# Patient Record
Sex: Female | Born: 1960
Health system: Southern US, Community
[De-identification: ages and names within clinical notes are randomized; demographics above are authoritative.]

## PROBLEM LIST (undated history)

## (undated) DIAGNOSIS — R079 Chest pain, unspecified: Secondary | ICD-10-CM

## (undated) DIAGNOSIS — E538 Deficiency of other specified B group vitamins: Secondary | ICD-10-CM

## (undated) DIAGNOSIS — R011 Cardiac murmur, unspecified: Secondary | ICD-10-CM

## (undated) DIAGNOSIS — R519 Headache, unspecified: Secondary | ICD-10-CM

## (undated) DIAGNOSIS — Z860100 Personal history of colon polyps, unspecified: Secondary | ICD-10-CM

## (undated) DIAGNOSIS — M5030 Other cervical disc degeneration, unspecified cervical region: Secondary | ICD-10-CM

## (undated) DIAGNOSIS — L738 Other specified follicular disorders: Secondary | ICD-10-CM

## (undated) DIAGNOSIS — K635 Polyp of colon: Secondary | ICD-10-CM

## (undated) DIAGNOSIS — Z720 Tobacco use: Secondary | ICD-10-CM

## (undated) DIAGNOSIS — F329 Major depressive disorder, single episode, unspecified: Secondary | ICD-10-CM

## (undated) DIAGNOSIS — Z9889 Other specified postprocedural states: Secondary | ICD-10-CM

## (undated) DIAGNOSIS — K219 Gastro-esophageal reflux disease without esophagitis: Secondary | ICD-10-CM

## (undated) DIAGNOSIS — L678 Other hair color and hair shaft abnormalities: Secondary | ICD-10-CM

## (undated) DIAGNOSIS — M7730 Calcaneal spur, unspecified foot: Secondary | ICD-10-CM

## (undated) DIAGNOSIS — R109 Unspecified abdominal pain: Secondary | ICD-10-CM

## (undated) DIAGNOSIS — I10 Essential (primary) hypertension: Secondary | ICD-10-CM

## (undated) DIAGNOSIS — R21 Rash and other nonspecific skin eruption: Secondary | ICD-10-CM

## (undated) DIAGNOSIS — N6019 Diffuse cystic mastopathy of unspecified breast: Secondary | ICD-10-CM

## (undated) DIAGNOSIS — M1990 Unspecified osteoarthritis, unspecified site: Secondary | ICD-10-CM

## (undated) DIAGNOSIS — R51 Headache: Secondary | ICD-10-CM

## (undated) DIAGNOSIS — G8929 Other chronic pain: Secondary | ICD-10-CM

## (undated) DIAGNOSIS — E669 Obesity, unspecified: Secondary | ICD-10-CM

## (undated) DIAGNOSIS — E559 Vitamin D deficiency, unspecified: Secondary | ICD-10-CM

## (undated) DIAGNOSIS — J189 Pneumonia, unspecified organism: Secondary | ICD-10-CM

## (undated) DIAGNOSIS — M5136 Other intervertebral disc degeneration, lumbar region: Secondary | ICD-10-CM

## (undated) DIAGNOSIS — E119 Type 2 diabetes mellitus without complications: Secondary | ICD-10-CM

## (undated) DIAGNOSIS — E785 Hyperlipidemia, unspecified: Secondary | ICD-10-CM

## (undated) DIAGNOSIS — R002 Palpitations: Secondary | ICD-10-CM

## (undated) DIAGNOSIS — Z87898 Personal history of other specified conditions: Secondary | ICD-10-CM

## (undated) DIAGNOSIS — M797 Fibromyalgia: Secondary | ICD-10-CM

## (undated) DIAGNOSIS — R000 Tachycardia, unspecified: Secondary | ICD-10-CM

## (undated) DIAGNOSIS — R112 Nausea with vomiting, unspecified: Secondary | ICD-10-CM

## (undated) DIAGNOSIS — M549 Dorsalgia, unspecified: Secondary | ICD-10-CM

## (undated) DIAGNOSIS — G4733 Obstructive sleep apnea (adult) (pediatric): Secondary | ICD-10-CM

## (undated) DIAGNOSIS — F32A Depression, unspecified: Secondary | ICD-10-CM

## (undated) DIAGNOSIS — G43909 Migraine, unspecified, not intractable, without status migrainosus: Secondary | ICD-10-CM

## (undated) HISTORY — DX: Tobacco use: Z72.0

## (undated) HISTORY — PX: BACK SURGERY: SHX140

## (undated) HISTORY — DX: Type 2 diabetes mellitus without complications: E11.9

## (undated) HISTORY — DX: Calcaneal spur, unspecified foot: M77.30

## (undated) HISTORY — PX: TONSILLECTOMY: SUR1361

## (undated) HISTORY — DX: Fibromyalgia: M79.7

## (undated) HISTORY — DX: Essential (primary) hypertension: I10

## (undated) HISTORY — DX: Diffuse cystic mastopathy of unspecified breast: N60.19

## (undated) HISTORY — DX: Obesity, unspecified: E66.9

## (undated) HISTORY — DX: Hyperlipidemia, unspecified: E78.5

## (undated) HISTORY — PX: ECTOPIC PREGNANCY SURGERY: SHX613

## (undated) HISTORY — DX: Personal history of colonic polyps: Z86.010

## (undated) HISTORY — DX: Vitamin D deficiency, unspecified: E55.9

## (undated) HISTORY — DX: Major depressive disorder, single episode, unspecified: F32.9

## (undated) HISTORY — DX: Other specified follicular disorders: L73.8

## (undated) HISTORY — PX: TUBAL LIGATION: SHX77

## (undated) HISTORY — DX: Personal history of other specified conditions: Z87.898

## (undated) HISTORY — DX: Deficiency of other specified B group vitamins: E53.8

## (undated) HISTORY — DX: Unspecified abdominal pain: R10.9

## (undated) HISTORY — DX: Obstructive sleep apnea (adult) (pediatric): G47.33

## (undated) HISTORY — DX: Personal history of colon polyps, unspecified: Z86.0100

## (undated) HISTORY — DX: Headache, unspecified: R51.9

## (undated) HISTORY — PX: NASAL SINUS SURGERY: SHX719

## (undated) HISTORY — DX: Polyp of colon: K63.5

## (undated) HISTORY — DX: Tachycardia, unspecified: R00.0

## (undated) HISTORY — DX: Other intervertebral disc degeneration, lumbar region: M51.36

## (undated) HISTORY — DX: Other hair color and hair shaft abnormalities: L67.8

## (undated) HISTORY — DX: Gastro-esophageal reflux disease without esophagitis: K21.9

## (undated) HISTORY — DX: Chest pain, unspecified: R07.9

## (undated) HISTORY — DX: Other cervical disc degeneration, unspecified cervical region: M50.30

## (undated) HISTORY — DX: Cardiac murmur, unspecified: R01.1

## (undated) HISTORY — DX: Rash and other nonspecific skin eruption: R21

## (undated) HISTORY — DX: Headache: R51

## (undated) HISTORY — DX: Depression, unspecified: F32.A

---

## 1998-06-25 HISTORY — PX: CARDIAC CATHETERIZATION: SHX172

## 2000-09-05 ENCOUNTER — Encounter: Admission: RE | Admit: 2000-09-05 | Discharge: 2000-09-05 | Payer: Self-pay | Admitting: Family Medicine

## 2000-09-05 ENCOUNTER — Encounter: Payer: Self-pay | Admitting: Family Medicine

## 2000-09-13 ENCOUNTER — Other Ambulatory Visit: Admission: RE | Admit: 2000-09-13 | Discharge: 2000-09-13 | Payer: Self-pay | Admitting: Family Medicine

## 2000-09-17 ENCOUNTER — Encounter: Admission: RE | Admit: 2000-09-17 | Discharge: 2000-09-17 | Payer: Self-pay | Admitting: Family Medicine

## 2000-09-17 ENCOUNTER — Encounter: Payer: Self-pay | Admitting: Family Medicine

## 2000-10-17 LAB — FECAL OCCULT BLOOD, GUAIAC: Fecal Occult Blood: NEGATIVE

## 2002-02-11 ENCOUNTER — Encounter: Payer: Self-pay | Admitting: Family Medicine

## 2002-02-11 ENCOUNTER — Other Ambulatory Visit: Admission: RE | Admit: 2002-02-11 | Discharge: 2002-02-11 | Payer: Self-pay | Admitting: Family Medicine

## 2002-02-11 LAB — CONVERTED CEMR LAB: Pap Smear: NORMAL

## 2003-06-26 HISTORY — PX: ULNAR NERVE TRANSPOSITION: SHX2595

## 2003-11-05 ENCOUNTER — Encounter: Admission: RE | Admit: 2003-11-05 | Discharge: 2003-11-05 | Payer: Self-pay | Admitting: Specialist

## 2003-11-24 HISTORY — PX: ANTERIOR CERVICAL DECOMP/DISCECTOMY FUSION: SHX1161

## 2003-12-21 ENCOUNTER — Observation Stay (HOSPITAL_COMMUNITY): Admission: RE | Admit: 2003-12-21 | Discharge: 2003-12-22 | Payer: Self-pay | Admitting: Specialist

## 2004-05-12 ENCOUNTER — Ambulatory Visit: Payer: Self-pay | Admitting: Family Medicine

## 2004-06-25 HISTORY — PX: COLONOSCOPY: SHX174

## 2004-06-25 HISTORY — PX: SHOULDER ARTHROSCOPY WITH ROTATOR CUFF REPAIR: SHX5685

## 2004-07-04 ENCOUNTER — Ambulatory Visit: Payer: Self-pay | Admitting: Professional

## 2004-09-07 ENCOUNTER — Ambulatory Visit: Payer: Self-pay | Admitting: Family Medicine

## 2004-09-18 ENCOUNTER — Ambulatory Visit: Payer: Self-pay | Admitting: Family Medicine

## 2004-10-24 ENCOUNTER — Ambulatory Visit: Payer: Self-pay | Admitting: Family Medicine

## 2005-02-28 ENCOUNTER — Ambulatory Visit: Payer: Self-pay | Admitting: Family Medicine

## 2005-03-19 ENCOUNTER — Ambulatory Visit: Payer: Self-pay | Admitting: Family Medicine

## 2005-03-23 ENCOUNTER — Ambulatory Visit: Payer: Self-pay | Admitting: Internal Medicine

## 2005-04-04 ENCOUNTER — Ambulatory Visit: Payer: Self-pay | Admitting: Internal Medicine

## 2005-04-04 ENCOUNTER — Encounter (INDEPENDENT_AMBULATORY_CARE_PROVIDER_SITE_OTHER): Payer: Self-pay | Admitting: *Deleted

## 2005-06-01 ENCOUNTER — Encounter: Admission: RE | Admit: 2005-06-01 | Discharge: 2005-06-01 | Payer: Self-pay | Admitting: Orthopedic Surgery

## 2005-12-14 ENCOUNTER — Ambulatory Visit: Payer: Self-pay | Admitting: Cardiovascular Disease

## 2005-12-17 ENCOUNTER — Ambulatory Visit: Payer: Self-pay

## 2005-12-17 ENCOUNTER — Encounter: Payer: Self-pay | Admitting: Cardiology

## 2006-02-04 ENCOUNTER — Ambulatory Visit: Payer: Self-pay | Admitting: Family Medicine

## 2006-04-30 ENCOUNTER — Ambulatory Visit: Payer: Self-pay | Admitting: Family Medicine

## 2006-05-02 ENCOUNTER — Encounter: Admission: RE | Admit: 2006-05-02 | Discharge: 2006-05-02 | Payer: Self-pay | Admitting: Family Medicine

## 2006-05-02 LAB — HM MAMMOGRAPHY: HM Mammogram: NORMAL

## 2006-05-08 ENCOUNTER — Ambulatory Visit: Payer: Self-pay | Admitting: Family Medicine

## 2006-05-08 LAB — LIPID PANEL
Cholesterol: 283 mg/dL — AB (ref 0–200)
HDL: 32 mg/dL — AB (ref 35–70)
Triglycerides: 284

## 2006-05-08 LAB — COMPREHENSIVE METABOLIC PANEL
ALT: 19 U/L (ref 7–35)
AST: 18 U/L
BUN: 11 mg/dL (ref 4–21)
Calcium: 8.7 mg/dL
Creat: 0.9
Glucose: 89
Potassium: 3.6 mmol/L
Sodium: 141 mmol/L (ref 137–147)

## 2006-05-08 LAB — CBC: Hemoglobin: 13.8 g/dL (ref 12.0–16.0)

## 2006-05-22 ENCOUNTER — Ambulatory Visit: Payer: Self-pay | Admitting: Family Medicine

## 2006-12-14 DIAGNOSIS — R109 Unspecified abdominal pain: Secondary | ICD-10-CM | POA: Insufficient documentation

## 2006-12-16 ENCOUNTER — Encounter: Payer: Self-pay | Admitting: Family Medicine

## 2006-12-16 ENCOUNTER — Ambulatory Visit: Payer: Self-pay | Admitting: Internal Medicine

## 2006-12-16 DIAGNOSIS — K219 Gastro-esophageal reflux disease without esophagitis: Secondary | ICD-10-CM | POA: Insufficient documentation

## 2006-12-16 DIAGNOSIS — L738 Other specified follicular disorders: Secondary | ICD-10-CM | POA: Insufficient documentation

## 2006-12-16 DIAGNOSIS — R011 Cardiac murmur, unspecified: Secondary | ICD-10-CM | POA: Insufficient documentation

## 2006-12-16 DIAGNOSIS — N6019 Diffuse cystic mastopathy of unspecified breast: Secondary | ICD-10-CM | POA: Insufficient documentation

## 2006-12-16 DIAGNOSIS — F331 Major depressive disorder, recurrent, moderate: Secondary | ICD-10-CM | POA: Insufficient documentation

## 2006-12-17 ENCOUNTER — Encounter: Payer: Self-pay | Admitting: Internal Medicine

## 2006-12-25 ENCOUNTER — Telehealth: Payer: Self-pay | Admitting: Family Medicine

## 2006-12-31 ENCOUNTER — Ambulatory Visit: Payer: Self-pay | Admitting: Family Medicine

## 2006-12-31 DIAGNOSIS — R5383 Other fatigue: Secondary | ICD-10-CM

## 2006-12-31 DIAGNOSIS — R5381 Other malaise: Secondary | ICD-10-CM | POA: Insufficient documentation

## 2007-01-02 LAB — CONVERTED CEMR LAB
ALT: 30 units/L (ref 0–35)
AST: 33 units/L (ref 0–37)
Albumin: 3.5 g/dL (ref 3.5–5.2)
Alkaline Phosphatase: 78 units/L (ref 39–117)
BUN: 8 mg/dL (ref 6–23)
Basophils Absolute: 0 10*3/uL (ref 0.0–0.1)
Basophils Relative: 0.1 % (ref 0.0–1.0)
Bilirubin, Direct: 0.1 mg/dL (ref 0.0–0.3)
CO2: 29 meq/L (ref 19–32)
Calcium: 8.9 mg/dL (ref 8.4–10.5)
Chloride: 105 meq/L (ref 96–112)
Cholesterol: 281 mg/dL (ref 0–200)
Creatinine, Ser: 0.8 mg/dL (ref 0.4–1.2)
Direct LDL: 221.8 mg/dL
Eosinophils Absolute: 0.4 10*3/uL (ref 0.0–0.6)
Eosinophils Relative: 4.2 % (ref 0.0–5.0)
GFR calc Af Amer: 101 mL/min
GFR calc non Af Amer: 83 mL/min
Glucose, Bld: 173 mg/dL — ABNORMAL HIGH (ref 70–99)
HCT: 37.9 % (ref 36.0–46.0)
HDL: 31.3 mg/dL — ABNORMAL LOW (ref >39.0)
Hemoglobin: 13.2 g/dL (ref 12.0–15.0)
Lymphocytes Relative: 28.6 % (ref 12.0–46.0)
MCHC: 34.9 g/dL (ref 30.0–36.0)
MCV: 89.3 fL (ref 78.0–100.0)
Monocytes Absolute: 0.5 10*3/uL (ref 0.2–0.7)
Monocytes Relative: 5 % (ref 3.0–11.0)
Neutro Abs: 5.7 10*3/uL (ref 1.4–7.7)
Neutrophils Relative %: 62.1 % (ref 43.0–77.0)
Phosphorus: 3.2 mg/dL (ref 2.3–4.6)
Platelets: 433 10*3/uL — ABNORMAL HIGH (ref 150–400)
Potassium: 3.9 meq/L (ref 3.5–5.1)
RBC: 4.24 M/uL (ref 3.87–5.11)
RDW: 14.9 % — ABNORMAL HIGH (ref 11.5–14.6)
Sodium: 141 meq/L (ref 135–145)
TSH: 1.57 microintl units/mL (ref 0.35–5.50)
Total Bilirubin: 0.9 mg/dL (ref 0.3–1.2)
Total CHOL/HDL Ratio: 9
Total Protein: 7.3 g/dL (ref 6.0–8.3)
Triglycerides: 169 mg/dL — ABNORMAL HIGH (ref 0–149)
VLDL: 34 mg/dL (ref 0–40)
WBC: 9.2 10*3/uL (ref 4.5–10.5)

## 2007-01-23 ENCOUNTER — Ambulatory Visit: Payer: Self-pay | Admitting: Family Medicine

## 2007-01-23 DIAGNOSIS — F17200 Nicotine dependence, unspecified, uncomplicated: Secondary | ICD-10-CM | POA: Insufficient documentation

## 2007-01-23 DIAGNOSIS — E1165 Type 2 diabetes mellitus with hyperglycemia: Secondary | ICD-10-CM

## 2007-01-23 DIAGNOSIS — E118 Type 2 diabetes mellitus with unspecified complications: Secondary | ICD-10-CM | POA: Insufficient documentation

## 2007-01-23 DIAGNOSIS — Z87891 Personal history of nicotine dependence: Secondary | ICD-10-CM | POA: Insufficient documentation

## 2007-01-28 ENCOUNTER — Ambulatory Visit: Payer: Self-pay | Admitting: Family Medicine

## 2007-01-29 LAB — CONVERTED CEMR LAB: Hgb A1c MFr Bld: 6.2 % — ABNORMAL HIGH (ref 4.6–6.0)

## 2007-02-27 ENCOUNTER — Telehealth (INDEPENDENT_AMBULATORY_CARE_PROVIDER_SITE_OTHER): Payer: Self-pay | Admitting: *Deleted

## 2007-12-11 ENCOUNTER — Ambulatory Visit: Payer: Self-pay | Admitting: Family Medicine

## 2008-04-27 ENCOUNTER — Ambulatory Visit: Payer: Self-pay | Admitting: Family Medicine

## 2008-04-27 DIAGNOSIS — Z860100 Personal history of colon polyps, unspecified: Secondary | ICD-10-CM | POA: Insufficient documentation

## 2008-04-29 LAB — CONVERTED CEMR LAB
ALT: 19 units/L (ref 0–35)
AST: 23 units/L (ref 0–37)
Albumin: 3.6 g/dL (ref 3.5–5.2)
Alkaline Phosphatase: 70 units/L (ref 39–117)
BUN: 8 mg/dL (ref 6–23)
Basophils Absolute: 0.1 10*3/uL (ref 0.0–0.1)
Basophils Relative: 0.9 % (ref 0.0–3.0)
Bilirubin, Direct: 0.1 mg/dL (ref 0.0–0.3)
CO2: 25 meq/L (ref 19–32)
Calcium: 8.6 mg/dL (ref 8.4–10.5)
Chloride: 105 meq/L (ref 96–112)
Cholesterol: 275 mg/dL (ref 0–200)
Creatinine, Ser: 0.8 mg/dL (ref 0.4–1.2)
Direct LDL: 194 mg/dL
Eosinophils Absolute: 0.3 10*3/uL (ref 0.0–0.7)
Eosinophils Relative: 4.2 % (ref 0.0–5.0)
GFR calc Af Amer: 100 mL/min
GFR calc non Af Amer: 82 mL/min
Glucose, Bld: 91 mg/dL (ref 70–99)
HCT: 38.1 % (ref 36.0–46.0)
HDL: 32 mg/dL — ABNORMAL LOW (ref >39.0)
Hemoglobin: 13.1 g/dL (ref 12.0–15.0)
Hgb A1c MFr Bld: 6.2 % — ABNORMAL HIGH (ref 4.6–6.0)
Lymphocytes Relative: 26.7 % (ref 12.0–46.0)
MCHC: 34.5 g/dL (ref 30.0–36.0)
MCV: 88.3 fL (ref 78.0–100.0)
Monocytes Absolute: 0.6 10*3/uL (ref 0.1–1.0)
Monocytes Relative: 8.7 % (ref 3.0–12.0)
Neutro Abs: 4.1 10*3/uL (ref 1.4–7.7)
Neutrophils Relative %: 59.5 % (ref 43.0–77.0)
Phosphorus: 2.8 mg/dL (ref 2.3–4.6)
Platelets: 285 10*3/uL (ref 150–400)
Potassium: 3.6 meq/L (ref 3.5–5.1)
RBC: 4.31 M/uL (ref 3.87–5.11)
RDW: 14.1 % (ref 11.5–14.6)
Sodium: 138 meq/L (ref 135–145)
TSH: 1.49 microintl units/mL (ref 0.35–5.50)
Total Bilirubin: 0.7 mg/dL (ref 0.3–1.2)
Total CHOL/HDL Ratio: 8.6
Total Protein: 7.6 g/dL (ref 6.0–8.3)
Triglycerides: 196 mg/dL — ABNORMAL HIGH (ref 0–149)
VLDL: 39 mg/dL (ref 0–40)
WBC: 6.9 10*3/uL (ref 4.5–10.5)

## 2008-06-23 ENCOUNTER — Telehealth: Payer: Self-pay | Admitting: Family Medicine

## 2009-01-11 ENCOUNTER — Ambulatory Visit: Payer: Self-pay | Admitting: Family Medicine

## 2009-03-28 ENCOUNTER — Encounter: Payer: Self-pay | Admitting: Family Medicine

## 2009-05-25 ENCOUNTER — Telehealth: Payer: Self-pay | Admitting: Family Medicine

## 2009-06-25 HISTORY — PX: DOBUTAMINE STRESS ECHO: SHX5426

## 2009-08-17 ENCOUNTER — Ambulatory Visit: Payer: Self-pay | Admitting: Family Medicine

## 2009-08-19 ENCOUNTER — Telehealth: Payer: Self-pay | Admitting: Family Medicine

## 2009-09-01 ENCOUNTER — Telehealth: Payer: Self-pay | Admitting: Family Medicine

## 2010-02-14 ENCOUNTER — Ambulatory Visit: Payer: Self-pay | Admitting: Family Medicine

## 2010-04-05 ENCOUNTER — Encounter (INDEPENDENT_AMBULATORY_CARE_PROVIDER_SITE_OTHER): Payer: Self-pay | Admitting: *Deleted

## 2010-05-16 ENCOUNTER — Observation Stay (HOSPITAL_COMMUNITY)
Admission: EM | Admit: 2010-05-16 | Discharge: 2010-05-17 | Payer: Self-pay | Source: Home / Self Care | Admitting: Emergency Medicine

## 2010-05-17 ENCOUNTER — Encounter: Payer: Self-pay | Admitting: Family Medicine

## 2010-05-17 ENCOUNTER — Ambulatory Visit: Payer: Self-pay | Admitting: Cardiology

## 2010-05-17 ENCOUNTER — Encounter (INDEPENDENT_AMBULATORY_CARE_PROVIDER_SITE_OTHER): Payer: Self-pay | Admitting: Emergency Medicine

## 2010-06-06 ENCOUNTER — Ambulatory Visit: Payer: Self-pay | Admitting: Family Medicine

## 2010-06-06 DIAGNOSIS — R079 Chest pain, unspecified: Secondary | ICD-10-CM | POA: Insufficient documentation

## 2010-06-09 LAB — CONVERTED CEMR LAB
ALT: 17 units/L (ref 0–35)
AST: 21 units/L (ref 0–37)
Cholesterol: 297 mg/dL — ABNORMAL HIGH (ref 0–200)
Direct LDL: 225.7 mg/dL
HDL: 40.9 mg/dL (ref >39.00)
Hgb A1c MFr Bld: 6.4 % (ref 4.6–6.5)
Total CHOL/HDL Ratio: 7
Triglycerides: 174 mg/dL — ABNORMAL HIGH (ref 0.0–149.0)
VLDL: 34.8 mg/dL (ref 0.0–40.0)

## 2010-06-15 ENCOUNTER — Ambulatory Visit: Payer: Self-pay | Admitting: Cardiovascular Disease

## 2010-06-20 ENCOUNTER — Encounter: Payer: Self-pay | Admitting: Cardiovascular Disease

## 2010-06-20 DIAGNOSIS — R0600 Dyspnea, unspecified: Secondary | ICD-10-CM

## 2010-06-20 DIAGNOSIS — I10 Essential (primary) hypertension: Secondary | ICD-10-CM | POA: Insufficient documentation

## 2010-06-20 DIAGNOSIS — R000 Tachycardia, unspecified: Secondary | ICD-10-CM | POA: Insufficient documentation

## 2010-06-20 DIAGNOSIS — E785 Hyperlipidemia, unspecified: Secondary | ICD-10-CM | POA: Insufficient documentation

## 2010-07-25 NOTE — Letter (Signed)
Summary: Colonoscopy Letter  [facility]  [address] [place], [place] [address]   Phone: [phone]  Fax: [phone]      April 05, 2010 MRN: [id number]   ROLANDO WHITBY [address] [place], [place]  [address]   Dear Ms. Shean,   According to your medical record, it is time for you to schedule a Colonoscopy. The American Cancer Society recommends this procedure as a method to detect early colon cancer. Patients with a family history of colon cancer, or a personal history of colon polyps or inflammatory bowel disease are at increased risk.  This letter has been generated based on the recommendations made at the time of your procedure. If you feel that in your particular situation this may no longer apply, please contact our office.  Please call our office at [phone] to schedule this appointment or to update your records at your earliest convenience.  Thank you for cooperating with [place] to provide you with the very best care possible.   Sincerely,   Iva Boop, M.D.  [facility] [phone]

## 2010-07-25 NOTE — Assessment & Plan Note (Signed)
Summary: OV   Vital Signs:  Patient Profile:   50 Years Old Female Weight:      286.04 pounds Temp:     98.2 degrees F oral Pulse rate:   80 / minute Pulse rhythm:   regular BP sitting:   126 / 100  (right arm) Cuff size:   large  Vitals Entered By: L. Etheleen Mayhew, CMA               PCP:  Publishing rights manager Complaint:  Medication changes.  History of Present Illness: has not had bp med this am feels bad, tired, anxious, and mind is racing periods are lasting for over 10 days (followed by gyn)- had endo bx which was ok  was on cymbalta from headache Dr Vela Prose), had been on effexor (neither worked for her HA) now getting back on prozac just several days -worked well for her mood in past but has just gone back on it  is really tired at this point (? also peri menup hot flashes and night sweats) is up all night- most nights tylenol pm will help her sleep for 3 hours only is not on any hormones right now- but considering progesterone if menses don't improve (with gyn) lots of stress- has had sessions in Dr Luiz Blare in the past has good support at home (just gets overwhelmed)  uses imitrex for headaches, and has dilaudid for severe emergency breakthrough ha (seldom takes it)  needs chol check on vytorin as well has not had her [place] of abd yet, still a lot of nausea  Current Allergies: ! PENICILLIN V POTASSIUM ! CODEINE SULFATE ! HYDROCODONE-GUAIFENESIN     Review of Systems  General      Complains of fatigue.      Denies chills, fever, and loss of appetite.  ENT      Denies sinus pressure.  CV      Complains of palpitations.      Denies chest pain or discomfort.      when she is nervous  Resp      Denies shortness of breath.  GI      Complains of nausea.      Denies diarrhea and vomiting.  MS      Complains of stiffness.  Derm      Denies rash.  Psych      Complains of anxiety, depression, and irritability.      Denies suicidal thoughts/plans.  Endo   Denies excessive thirst and excessive urination.   Physical Exam  General:     overwt and fatigued appearing Head:     Normocephalic and atraumatic without obvious abnormalities. No apparent alopecia or balding. Eyes:     vision grossly intact, pupils equal, pupils round, and pupils reactive to light.  no pallor or icterus Mouth:     MMM pharynx pink and moist.   Neck:     No deformities, masses, or tenderness noted.no thyromegaly, no JVD, and no carotid bruits.   Chest Wall:     No deformities, masses, or tenderness noted. Lungs:     Normal respiratory effort, chest expands symmetrically. Lungs are clear to auscultation, no crackles or wheezes. Heart:     Normal rate and regular rhythm. S1 and S2 normal without gallop, murmur, click, rub or other extra sounds. Abdomen:     soft, non-tender, normal bowel sounds, no distention, no masses, no hepatomegaly, and no splenomegaly.   Msk:     no acute joint changes Pulses:  R and L carotid,radial,femoral,dorsalis pedis and posterior tibial pulses are full and equal bilaterally Extremities:     trace left pedal edema and trace right pedal edema.   Neurologic:     sensation intact to light touch, gait normal, and DTRs symmetrical and normal.  no tremor Skin:     turgor normal, color normal, and no rashes.  dermatitis on arms and legs looks improved with healing scabs Cervical Nodes:     No lymphadenopathy noted Psych:     anxious and depressed, no SI, good insight    Impression & Recommendations:  Problem # 1:  FATIGUE (ICD-780.79) with heavy menses and peri menup symptoms also depression and anx will check labs Orders: TLB-CBC Platelet - w/Differential (85025-CBCD) TLB-TSH (Thyroid Stimulating Hormone) (84443-TSH)   Problem # 2:  HYPERTENSION (ICD-401.9) did not take meds today will check K Her updated medication list for this problem includes:    Hydrochlorothiazide 12.5 Mg Tabs (Hydrochlorothiazide) .Marland Kitchen... Take  one by mouth daily  Orders: TLB-Renal Function Panel (80069-RENAL)   Problem # 3:  DEPRESSION (ICD-311) with sleep disorder (also may be hormonal) will try ambien to help sleep prn will give the prozac more time to work offered counseling again if she wants it Her updated medication list for this problem includes:    Prozac 20 Mg Caps (Fluoxetine hcl) .Marland Kitchen... 1 by mouth qd   Problem # 4:  NAUSEA (ICD-787.02) still pend abd [place] Her updated medication list for this problem includes:    Promethazine Hcl 25 Mg Tabs (Promethazine hcl) .Marland Kitchen... 1 tab every 6 hours as needed nausea. may cause drowsiness.   Medications Added to Medication List This Visit: 1)  Ambien 10 Mg Tabs (Zolpidem tartrate) .Marland Kitchen.. 1 by mouth at bedtime as needed insomnia  Other Orders: Venipuncture (13244) TLB-Lipid Panel (80061-LIPID) TLB-Hepatic/Liver Function Pnl (80076-HEPATIC)   Patient Instructions: 1)  we will check labs today 2)  continue prozac as px 3)  try ambien 10 mg at bedtime for sleep- use caution, it may be habit forming    Prescriptions: AMBIEN 10 MG  TABS (ZOLPIDEM TARTRATE) 1 by mouth at bedtime as needed insomnia  #30 x 0   Entered and Authorized by:   Judith Part MD   Signed by:   Judith Part MD on 12/31/2006   Method used:   Print then Give to Patient   RxID:   [id number]

## 2010-07-25 NOTE — Assessment & Plan Note (Signed)
Summary: STOMACH/CLE   Vital Signs:  Patient Profile:   50 Years Old Female Weight:      287 pounds Temp:     99.3 degrees F oral BP sitting:   132 / 90  (right arm) Cuff size:   large  Vitals Entered By: Lowella Petties (December 16, 2006 2:12 PM)               Visit Type:  Acute PCP:  [facility]  Chief Complaint:  Upper abd pain and nausea.  History of Present Illness: Has had some problem with mid-epigastric pain that is sometimes accompanied by nausea. She has actually had some vomiting. The color of trhe stools is pale tannish to a very dark green. Theis changes and isn't also true. The BM's are regular. No fevers or chills.  Current Allergies (reviewed today): ! PENICILLIN V POTASSIUM (PENICILLIN V POTASSIUM TABS) ! CODEINE SULFATE (CODEINE SULFATE TABS) ! HYDROCODONE-GUAIFENESIN (HYDROCODONE-GUAIFENESIN TABS) Updated/Current Medications (including changes made in today's visit):  HYDROCHLOROTHIAZIDE 12.5 MG  TABS (HYDROCHLOROTHIAZIDE) take one by mouth daily NEXIUM 40 MG  CPDR (ESOMEPRAZOLE MAGNESIUM) take one by mouth daily VYTORIN 10-80 MG  TABS (EZETIMIBE-SIMVASTATIN) take one by mouth daily CYMBALTA 60 MG  CPEP (DULOXETINE HCL) take one by mouth daily ZANAFLEX   CAPS (TIZANIDINE HCL CAPS) take 8 mg tid DILAUDID 4 MG  TABS (HYDROMORPHONE HCL) take 4-8 mg prn IMITREX 100 MG  TABS (SUMATRIPTAN SUCCINATE) take one by mouth prn K-TABS   TBCR (POTASSIUM CHLORIDE TBCR) 10 mEq 2 daily TYLENOL EXTRA STRENGTH 500 MG  TABS (ACETAMINOPHEN) 2 prn PROMETHAZINE HCL 25 MG  TABS (PROMETHAZINE HCL) 1 tab every 6 hours as needed nausea. May cause drowsiness.      Review of Systems       The patient complains of abdominal pain.  The patient denies fever, syncope, dyspnea on exhertion, peripheral edema, melena, and severe indigestion/heartburn.         Has some reflux/indigestion but she thinks that is because of her medications. She is on Nexium 40 mg daily. The abd. pain is  mid-epigastric.   Physical Exam  General:     alert, well-developed, well-nourished, well-hydrated, appropriate dress, normal appearance, healthy-appearing, cooperative to examination, and good hygiene.   Neck:     supple.   Lungs:     normal respiratory effort, no accessory muscle use, and normal breath sounds.   Heart:     normal rate and regular rhythm.   Abdomen:     Abdomen is large and there is no palpable masses or hernia as best can be examined secondary to patient's obesity. Bowel sounds are slightly hypoactive and there is no tenderness .soft, no guarding, no rigidity, and no rebound tenderness.   Neurologic:     alert & oriented X3.   Psych:     memory intact for recent and remote, normally interactive, good eye contact, not anxious appearing, and not depressed appearing.      Impression & Recommendations:  Problem # 1:  ABDOMINAL PAIN, RECURRENT (ICD-789.00) Patient can not identify any specific foods or activities that cause this. Has seen GI for colon screening secondary to familial hx. Ultrasound of gall bladder. Continue the Nexium 40 daily as taking. Orders: Ultrasound (Ultrasound) T-Culture, C-Diff Toxin A/B ([id number]) T-Culture, Stool ([id number]) T-Culture, Giardia / Cryptosporidium ([id number])   Problem # 2:  NAUSEA (ICD-787.02) May have Phenergan 25 mg by mouth every 6 hours as needed. May make you drowsy. Orders: Ultrasound (Ultrasound)  Her updated  medication list for this problem includes:    Promethazine Hcl 25 Mg Tabs (Promethazine hcl) .Marland Kitchen... 1 tab every 6 hours as needed nausea. may cause drowsiness.   Medications Added to Medication List This Visit: 1)  Hydrochlorothiazide 12.5 Mg Tabs (Hydrochlorothiazide) .... Take one by mouth daily 2)  Nexium 40 Mg Cpdr (Esomeprazole magnesium) .... Take one by mouth daily 3)  Vytorin 10-80 Mg Tabs (Ezetimibe-simvastatin) .... Take one by mouth daily 4)  Cymbalta 60 Mg Cpep (Duloxetine hcl) ....  Take one by mouth daily 5)  Zanaflex Caps (Tizanidine hcl caps) .... Take 8 mg tid 6)  Dilaudid 4 Mg Tabs (Hydromorphone hcl) .... Take 4-8 mg prn 7)  Imitrex 100 Mg Tabs (Sumatriptan succinate) .... Take one by mouth prn 8)  K-tabs Tbcr (Potassium chloride tbcr) .Marland Kitchen.. 10 meq 2 daily 9)  Tylenol Extra Strength 500 Mg Tabs (Acetaminophen) .... 2 prn 10)  Promethazine Hcl 25 Mg Tabs (Promethazine hcl) .Marland Kitchen.. 1 tab every 6 hours as needed nausea. may cause drowsiness.   Patient Instructions: 1)  Ultrasound of gall bladder. 2)  Continue the Nexium 40 daily as taking. 3)  May have Phenergan 25 mg by mouth every 6 hours as needed. 4)  May make you drowsy. 5)  Follow-up with Dr. Milinda Antis in 7-10 days if sx's not better.  6)  Call sooner if the sx's worsen. If sudden onset severe pain go to the hospital ER for evaluation.

## 2010-07-25 NOTE — Progress Notes (Signed)
Summary: wants refill on clindomycin  Phone Note Call from Patient Call back at Home Phone [phone]   Caller: Patient Summary of Call: Pt was given a script for clindomycin on 2/23 for an abscessed tooth  but she says her dog has gotten a hold of her pills and chewed them up.  She is asking for a [facility] to [facility] [place]. Advised her that her insurance may not pay for another round this soon. Initial call taken by: Lowella Petties CMA,  August 19, 2009 10:54 AM  Follow-up for Phone Call        can re- do that px --px written on EMR for call in  please encourage her to have her dog checked out--? I do not know how toxic that is to dogs   Follow-up by: Judith Part MD,  August 19, 2009 11:08 AM  Additional Follow-up for Phone Call Additional follow up Details #1::        Patient notified as instructed by telephone. Medication phoned to [facility] pharmacy as instructed. Lewanda Rife LPN  August 19, 2009 1:13 PM     Prescriptions: CLEOCIN 300 MG CAPS (CLINDAMYCIN HCL) 1 tab by mouth every 8 hours for 10 days. dispense qs  #30 x 0   Entered and Authorized by:   Judith Part MD   Signed by:   Lewanda Rife LPN on 09/81/1914   Method used:   Telephoned to ...       [facility] [address] [id number] (retail)       [address] [place], [place]  [address]       Ph: [phone]       Fax: [phone]   RxID:   [id number]
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
## 2010-07-25 NOTE — Progress Notes (Signed)
Summary: requests flexeril, something for migraine  Phone Note Refill Request Call back at Home Phone [phone] Message from:  Patient  Refills Requested: Medication #1:  FLEXERIL 10 MG TABS 1/2 to 1 by mouth up to three times a day as needed migraine Pt is also asking for 1 or 2 dilaudid or demerol for migraine, she says those are the only things that help,  [facility].  Initial call taken by: Lowella Petties CMA,  September 01, 2009 10:51 AM  Follow-up for Phone Call        can do a few dilaudid -- but has to pick up px  this is extremely strong - use caution  f/u if not imp printed in put in nurse in box for pickup  Follow-up by: Judith Part MD,  September 01, 2009 12:44 PM  Additional Follow-up for Phone Call Additional follow up Details #1::        Patient notified as instructed by telephone. Prescription left at front desk. Lewanda Rife LPN  September 01, 2009 1:35 PM     New/Updated Medications: FLEXERIL 10 MG TABS (CYCLOBENZAPRINE HCL) 1/2 to 1 by mouth up to three times a day as needed migraine DILAUDID 4 MG TABS (HYDROMORPHONE HCL) 1 by mouth up to two times a day as needed severe migraine Prescriptions: DILAUDID 4 MG TABS (HYDROMORPHONE HCL) 1 by mouth up to two times a day as needed severe migraine  #2 x 0   Entered and Authorized by:   Judith Part MD   Signed by:   Judith Part MD on 09/01/2009   Method used:   Print then Give to Patient   RxID:   [id number] FLEXERIL 10 MG TABS (CYCLOBENZAPRINE HCL) 1/2 to 1 by mouth up to three times a day as needed migraine  #20 x 0   Entered and Authorized by:   Judith Part MD   Signed by:   Judith Part MD on 09/01/2009   Method used:   Print then Give to Patient   RxID:   [id number]

## 2010-07-25 NOTE — Progress Notes (Signed)
Summary: rx  Phone Note Call from Patient Call back at [phone]   Caller: Patient Call For: Evia Goldsmith Summary of Call: pt has been on prozac for several years, her migraine specialist has changed her meds around and he changed her to cymbalta and it is not helping she wants you to prescribe prozac again, and she will d/c cymbalta.   [facility] Initial call taken by: Liane Comber,  December 25, 2006 4:12 PM  Follow-up for Phone Call        please send for her last note from headache doctor she will need to come down on cymbalta gradually before starting prozac- can she cut the pill in 1/2 or is it a capsule, let me know and I will advise further also please pull her chart for me, thanks Follow-up by: Judith Part MD,  December 25, 2006 5:28 PM  Additional Follow-up for Phone Call Additional follow up Details #1::        pt states she has already tapered off cymbalta, is off of it completely- chart is on your desk Additional Follow-up by: Lowella Petties,  December 26, 2006 8:36 AM  New/Updated Medications: PROZAC 20 MG  CAPS (FLUOXETINE HCL) 1 by mouth qd  Additional Follow-up for Phone Call Additional follow up Details #2::    thanks for the update, I will put px for prozac on EMR Follow-up by: Judith Part MD,  December 26, 2006 2:19 PM  Additional Follow-up for Phone Call Additional follow up Details #3:: Details for Additional Follow-up Action Taken: Patient Advised. Medication phoned to pharmacy.  Additional Follow-up by: Delilah Shan,  December 26, 2006 3:15 PM  New/Updated Medications: PROZAC 20 MG  CAPS (FLUOXETINE HCL) 1 by mouth qd  Prescriptions: PROZAC 20 MG  CAPS (FLUOXETINE HCL) 1 by mouth qd  #30 x 11   Entered and Authorized by:   Judith Part MD   Signed by:   Delilah Shan on 12/26/2006   Method used:   Telephoned to ...         RxID:   [id number]

## 2010-07-25 NOTE — Progress Notes (Signed)
Summary: rx  Phone Note Refill Request Call back at [phone] Message from:  Patient on February 27, 2007 12:01 PM  Refills Requested: Medication #1:  AMBIEN 10 MG  TABS 1 by mouth at bedtime as needed insomnia [facility]  Initial call taken by: Liane Comber,  February 27, 2007 12:01 PM Caller: Patient Call For: tower Initial call taken by: Liane Comber,  February 27, 2007 12:01 PM  Follow-up for Phone Call        put refil on EMR- times 3 Follow-up by: Judith Part MD,  February 27, 2007 1:36 PM  Additional Follow-up for Phone Call Additional follow up Details #1::        Advised patient, Rx called to pharmacy  ..................................................................Marland KitchenLiane Comber  February 27, 2007 2:49 PM        Prescriptions: AMBIEN 10 MG  TABS (ZOLPIDEM TARTRATE) 1 by mouth at bedtime as needed insomnia  #30 x 3   Entered and Authorized by:   Judith Part MD   Signed by:   Liane Comber on 02/27/2007   Method used:   Telephoned to ...         RxID:   [id number]
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
## 2010-07-25 NOTE — Consult Note (Signed)
[facility]   Imported By: Maryln Gottron 04/08/2009 15:11:22  _____________________________________________________________________  External Attachment:    Type:   Image     Comment:   External Document

## 2010-07-25 NOTE — Assessment & Plan Note (Signed)
Summary: TIRED,WEAK,SINUS PRESSURE,CONGESTION   Vital Signs:  Patient profile:   50 year old female Weight:      276 pounds Temp:     98.2 degrees F oral Pulse rate:   96 / minute Pulse rhythm:   regular BP sitting:   124 / 96  (left arm) Cuff size:   large  Vitals Entered By: Lowella Petties CMA (August 17, 2009 9:02 AM) CC: Tired, weak, sinus pressure, drainage in throat x weeks.   History of Present Illness: 50 yo with sinus pressure x 5 weeks. Has tooth on left bottom mouth that had a crown which fell off 5 weeks ago. Since then swollen with pus, now has left sided frontal pressure as well. No sinus drainage, no fevers, no chills. No sore throat or difficulty swallowing.  PMH- allergy to PCN.  Current Medications (verified): 1)  Prozac 40 Mg  Caps (Fluoxetine Hcl) .Marland Kitchen.. 1 By Mouth Once Daily 2)  Propranolol Hcl 20 Mg  Tabs (Propranolol Hcl) .... 2 By Mouth Three Times A Day 3)  Omeprazole 20 Mg Cpdr (Omeprazole) .Marland Kitchen.. 1 By Mouth Two Times A Day 4)  Flexeril 10 Mg Tabs (Cyclobenzaprine Hcl) .... 1/2 To 1 By Mouth Up To Three Times A Day As Needed Migraine 5)  Cleocin 300 Mg Caps (Clindamycin Hcl) .Marland Kitchen.. 1 Tab By Mouth Every 8 Hours For 10 Days. Dispense Qs  Allergies (verified): 1)  ! Penicillin V Potassium 2)  ! Codeine Sulfate 3)  ! Hydrocodone-Guaifenesin  Review of Systems      See HPI General:  Denies chills and fever. ENT:  Denies nasal congestion, postnasal drainage, sinus pressure, and sore throat. Resp:  Denies cough.  Physical Exam  General:  overweight but generally well appearing  Ears:  R ear normal and L ear normal.   Nose:  External nasal examination shows no deformity or inflammation. Nasal mucosa are pink and moist without lesions or exudates. Mouth:  swelling and abscess of left lower molar, no obvious drainage, gum tender to palpation, no external swelling of jaw. Lungs:  Normal respiratory effort, chest expands symmetrically. Lungs are clear to  auscultation, no crackles or wheezes. Heart:  RRR, 2/6 systolic M Cervical Nodes:  No lymphadenopathy noted Psych:  normal affect, talkative and pleasant    Impression & Recommendations:  Problem # 1:  ABSCESS, TOOTH (ICD-522.5) Assessment New PCN allergy, will treat with Clindamyin. Advised to seek dental care immediately, pt says it is expensive and she is not sure when she can go. Advised to follow up with [place] if unable to make dental appt.  Complete Medication List: 1)  Prozac 40 Mg Caps (Fluoxetine hcl) .Marland Kitchen.. 1 by mouth once daily 2)  Propranolol Hcl 20 Mg Tabs (Propranolol hcl) .... 2 by mouth three times a day 3)  Omeprazole 20 Mg Cpdr (Omeprazole) .Marland Kitchen.. 1 by mouth two times a day 4)  Flexeril 10 Mg Tabs (Cyclobenzaprine hcl) .... 1/2 to 1 by mouth up to three times a day as needed migraine 5)  Cleocin 300 Mg Caps (Clindamycin hcl) .Marland Kitchen.. 1 tab by mouth every 8 hours for 10 days. dispense qs Prescriptions: CLEOCIN 300 MG CAPS (CLINDAMYCIN HCL) 1 tab by mouth every 8 hours for 10 days. dispense qs  #1 x 0   Entered and Authorized by:   Ruthe Mannan MD   Signed by:   Ruthe Mannan MD on 08/17/2009   Method used:   [facility]        [facility] [id number]       [address]       [place], [place]  [address]       Ph: [phone] or [phone]       Fax: [phone]   RxID:   [id number]   Prior Medications (reviewed today): PROZAC 40 MG  CAPS (FLUOXETINE HCL) 1 by mouth once daily PROPRANOLOL HCL 20 MG  TABS (PROPRANOLOL HCL) 2 by mouth three times a day OMEPRAZOLE 20 MG CPDR (OMEPRAZOLE) 1 by mouth two times a day FLEXERIL 10 MG TABS (CYCLOBENZAPRINE HCL) 1/2 to 1 by mouth up to three times a day as needed migraine Current Allergies (reviewed today): ! PENICILLIN V POTASSIUM ! CODEINE SULFATE ! HYDROCODONE-GUAIFENESIN

## 2010-07-25 NOTE — Progress Notes (Signed)
Summary: requests med for migraine  Phone Note Call from Patient Call back at Home Phone [phone]   Caller: Patient Call For: Kathleen Good Summary of Call: Pt states she has had migraine since yesterday, asks if you will call something in for her to [facility].  She states you havent treated her for this before.  She states flexeril helps, as well as demerol. Initial call taken by: Lowella Petties,  June 23, 2008 11:58 AM  Follow-up for Phone Call        I do need her to f/u to disc migraine - likely no appt avail until next week- please schedule can call in small amt of flexeril (caution of sedation) - to get through this headache (but update me if it worsens or other new symptoms like fever) px written on EMR for call in  Follow-up by: Kathleen Good,  June 23, 2008 1:52 PM    New/Updated Medications: FLEXERIL 10 MG TABS (CYCLOBENZAPRINE HCL) 1/2 to 1 by mouth up to three times a day as needed migraine   Prescriptions: FLEXERIL 10 MG TABS (CYCLOBENZAPRINE HCL) 1/2 to 1 by mouth up to three times a day as needed migraine  #10 x 0   Entered by:   Liane Comber   Authorized by:   Kathleen Good   Signed by:   Liane Comber on 06/23/2008   Method used:   [facility]        [facility] ([facility]       [address]       [place], [place]  [address]       Ph: [phone] or [phone]       Fax: [phone]   RxID:   [id number]

## 2010-07-27 ENCOUNTER — Other Ambulatory Visit: Payer: Self-pay

## 2010-07-27 ENCOUNTER — Ambulatory Visit: Admit: 2010-07-27 | Payer: Self-pay | Admitting: Family Medicine

## 2010-07-27 ENCOUNTER — Encounter (INDEPENDENT_AMBULATORY_CARE_PROVIDER_SITE_OTHER): Payer: Self-pay | Admitting: *Deleted

## 2010-07-27 DIAGNOSIS — E785 Hyperlipidemia, unspecified: Secondary | ICD-10-CM
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
## 2010-07-27 NOTE — Miscellaneous (Signed)
Summary: Controlled Substance Agreement  Controlled Substance Agreement   Imported By: Lanelle Bal 06/12/2010 11:32:04  _____________________________________________________________________  External Attachment:    Type:   Image     Comment:   External Document
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
## 2010-08-01 ENCOUNTER — Ambulatory Visit (INDEPENDENT_AMBULATORY_CARE_PROVIDER_SITE_OTHER): Payer: 59 | Admitting: Family Medicine

## 2010-08-01 ENCOUNTER — Encounter: Payer: Self-pay | Admitting: Family Medicine

## 2010-08-01 DIAGNOSIS — R21 Rash and other nonspecific skin eruption: Secondary | ICD-10-CM

## 2010-08-01 DIAGNOSIS — L538 Other specified erythematous conditions: Secondary | ICD-10-CM

## 2010-08-02 ENCOUNTER — Encounter: Payer: Self-pay | Admitting: Family Medicine

## 2010-08-10 ENCOUNTER — Ambulatory Visit: Payer: 59 | Admitting: Family Medicine
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
## 2010-08-14 ENCOUNTER — Ambulatory Visit (INDEPENDENT_AMBULATORY_CARE_PROVIDER_SITE_OTHER): Payer: 59 | Admitting: Family Medicine

## 2010-08-14 ENCOUNTER — Encounter: Payer: Self-pay | Admitting: Family Medicine

## 2010-08-14 DIAGNOSIS — R21 Rash and other nonspecific skin eruption: Secondary | ICD-10-CM

## 2010-08-22 NOTE — Assessment & Plan Note (Signed)
Summary: Rash is no better//Kathleen Good   Vital Signs:  Patient profile:   50 year old female Weight:      274.50 pounds Temp:     98.9 degrees F oral Pulse rate:   80 / minute Pulse rhythm:   regular BP sitting:   122 / 80  (left arm) Cuff size:   large  Vitals Entered By: Selena Batten Dance CMA (AAMA) (August 14, 2010 2:14 PM) CC: Recheck rash-no better   History of Present Illness: CC: rash worse  Seen 1 1/2 wk ago with new skin rash thought to be scabies/crabs vs herpetic.  treated permethrin 5% cream x 10 hours then washed off.  1% for more delicate areas (groin), left on for 30 min and washed off.  Not any better.  Seems to be spreading under breasts, concentrated on stomach.  Genital area especially itchy.    No one else at home with similar sxs.  No fevers/chills, oral lesions, nausea/vomiting, abd pain, joint pains.  Feeling very fatigued and difficulty sleeping.  Hydroxyzine does help some with itch but makes her too sleepy.  States that this rash has been present intermittently for last several years (>5), has seen 2 dermatologists as well as previous PCP for this.  smoking 1 ppd.    Blood work including HSV positive for HSV 2 but negative viral culture.  No new soaps, shampoos, lotions, detergents, changes in foods.  no h/o eczema or asthma.  no family history.  has been reading online and wonders if could be lichen sclerosis.  reviewed old record from Dr. Terri Piedra - chronic folliculitis excised and acrochordons.  no other treatment discussed.  Current Medications (verified): 1)  Prozac 40 Mg  Caps (Fluoxetine Hcl) .Marland Kitchen.. 1 By Mouth Once Daily 2)  Propranolol Hcl 20 Mg  Tabs (Propranolol Hcl) .... 2 By Mouth Three Times A Day 3)  Omeprazole 20 Mg Cpdr (Omeprazole) .Marland Kitchen.. 1 By Mouth Two Times A Day 4)  Aspirin 81 Mg  Tabs (Aspirin) .... Take 1 Tablet By Mouth Once A Day 5)  Lipitor 20 Mg Tabs (Atorvastatin Calcium) .Marland Kitchen.. 1 By Mouth Once Daily  Generic Please 6)  Cardizem Cd 120 Mg  Xr24h-Cap (Diltiazem Hcl Coated Beads) .... Take One Tablet By Mouth Once Daily. 7)  Hydroxyzine Hcl 25 Mg Tabs (Hydroxyzine Hcl) .... Take One By Mouth Q6 Hours As Needed Itch 8)  Permethrin 5 % Crea (Permethrin) .... Apply To Body As Directed, Wash Off in 8 Hours  Allergies: 1)  ! Penicillin V Potassium 2)  ! Codeine Sulfate 3)  ! Hydrocodone-Guaifenesin  Past History:  Social History: Last updated: 12/11/2007 Marital Status: Married Children: 1 son current smoker   Past Medical History: Depression GERD Hypertension tob abuse hyperglycemia obesity   Review of Systems       per HPI  Physical Exam  General:  overweight but generally well appearing  Skin:  shallow ulcers throughout bilateral labia majora as well as some on abdomen and under L breast.  + excoriations.  + pruritic.  spares face, arms, legs, hands, feet, mouth, back.   Impression & Recommendations:  Problem # 1:  SKIN RASH (ICD-782.1) Assessment Unchanged not improved with scabies treatment.  ? irritant dermatitis.  pt states has been told by derm it's a "dermatitis".  will try and obtain records of prior visits.  treat now as ? autoimmune dermatitis with course of steroids to help itch and hopefully break itch/scratch cycle.  discussed possibility of treatment with acyclovir,  however not consistent with herpetic manifestation despite HSV 2 positive on blood work.  start with steroids for now, oatmeal bath, sarna cream.  consider referral back to Dr. Terri Piedra.    Complete Medication List: 1)  Prozac 40 Mg Caps (Fluoxetine hcl) .Marland Kitchen.. 1 by mouth once daily 2)  Propranolol Hcl 20 Mg Tabs (Propranolol hcl) .... 2 by mouth three times a day 3)  Omeprazole 20 Mg Cpdr (Omeprazole) .Marland Kitchen.. 1 by mouth two times a day 4)  Aspirin 81 Mg Tabs (Aspirin) .... Take 1 tablet by mouth once a day 5)  Lipitor 20 Mg Tabs (Atorvastatin calcium) .Marland Kitchen.. 1 by mouth once daily  generic please 6)  Cardizem Cd 120 Mg Xr24h-cap (Diltiazem  hcl coated beads) .... Take one tablet by mouth once daily. 7)  Hydroxyzine Hcl 25 Mg Tabs (Hydroxyzine hcl) .... Take one by mouth q6 hours as needed itch 8)  Prednisone 20 Mg Tabs (Prednisone) .... 2 daily for 7 days  Patient Instructions: 1)  Change appointment for follow up with myself. 2)  Keep blood work appointment. 3)  Course of oral steroids for 1 wk.  2 daily. 4)  Oatmeal bath for itching as well as sarna cream. 5)  Update me if not better with this treatment. Prescriptions: PREDNISONE 20 MG TABS (PREDNISONE) 2 daily for 7 days  #14 x 0   Entered and Authorized by:   Eustaquio Boyden  MD   Signed by:   Eustaquio Boyden  MD on 08/14/2010   Method used:   Electronically to        [facility] ([facility]ail[facility]       [address]       [place], [place]  [address]       Ph: [phone]       Fax: [phone]   RxID:   [id number]    Orders Added: 1)  Est. Patient Level III [84132]    Current Allergies (reviewed today): ! PENICILLIN V POTASSIUM ! CODEINE SULFATE ! HYDROCODONE-GUAIFENESIN

## 2010-09-05 ENCOUNTER — Ambulatory Visit: Payer: Self-pay | Admitting: Family Medicine

## 2010-09-05 DIAGNOSIS — Z0289 Encounter for other administrative examinations: Secondary | ICD-10-CM

## 2010-09-05 LAB — BASIC METABOLIC PANEL
BUN: 8 mg/dL (ref 6–23)
CO2: 25 mEq/L (ref 19–32)
Calcium: 8.6 mg/dL (ref 8.4–10.5)
Chloride: 106 mEq/L (ref 96–112)
Creatinine, Ser: 0.67 mg/dL (ref 0.4–1.2)
GFR calc Af Amer: >60 mL/min (ref >60)
GFR calc non Af Amer: >60 mL/min (ref >60)
Glucose, Bld: 101 mg/dL — ABNORMAL HIGH (ref 70–99)
Potassium: 3.8 mEq/L (ref 3.5–5.1)
Sodium: 138 mEq/L (ref 135–145)

## 2010-09-05 LAB — POCT CARDIAC MARKERS
CKMB, poc: 1.1 ng/mL (ref 1.0–8.0)
CKMB, poc: <1 ng/mL — ABNORMAL LOW (ref 1.0–8.0)
CKMB, poc: <1 ng/mL — ABNORMAL LOW (ref 1.0–8.0)
CKMB, poc: <1 ng/mL — ABNORMAL LOW (ref 1.0–8.0)
Myoglobin, poc: 52.5 ng/mL (ref 12–200)
Myoglobin, poc: 55.2 ng/mL (ref 12–200)
Myoglobin, poc: 55.8 ng/mL (ref 12–200)
Myoglobin, poc: 66.5 ng/mL (ref 12–200)
Troponin i, poc: <0.05 ng/mL (ref 0.00–0.09)
Troponin i, poc: <0.05 ng/mL (ref 0.00–0.09)
Troponin i, poc: <0.05 ng/mL (ref 0.00–0.09)
Troponin i, poc: <0.05 ng/mL (ref 0.00–0.09)

## 2010-09-05 LAB — CBC
HCT: 37.4 % (ref 36.0–46.0)
Hemoglobin: 12.4 g/dL (ref 12.0–15.0)
MCH: 28.5 pg (ref 26.0–34.0)
MCHC: 33.2 g/dL (ref 30.0–36.0)
MCV: 86 fL (ref 78.0–100.0)
Platelets: 343 10*3/uL (ref 150–400)
RBC: 4.35 MIL/uL (ref 3.87–5.11)
RDW: 14.9 % (ref 11.5–15.5)
WBC: 11.5 10*3/uL — ABNORMAL HIGH (ref 4.0–10.5)

## 2010-09-07 ENCOUNTER — Telehealth: Payer: Self-pay | Admitting: Family Medicine

## 2010-09-12 NOTE — Progress Notes (Signed)
Summary: rash  Phone Note Call from Patient Call back at Home Phone [phone] Call back at [phone]   Caller: Patient Call For: Eustaquio Boyden  MD Summary of Call: Patient says that her rash had started to get better, but about 4-5 days after being off the prednisone it started to come back. She says that last night it started to look horrible and that it starts from under her breast all the way down to her vagina and it is very itchy She is asking if she could get another round of the prednisone. Uses [facility] [address]  Initial call taken by: Melody Comas,  September 07, 2010 11:30 AM  Follow-up for Phone Call        may call in steroid taper then recommend topical steroid (sent in as well).  rec none to face, underarms, or groin Follow-up by: Eustaquio Boyden  MD,  September 07, 2010 12:49 PM  Additional Follow-up for Phone Call Additional follow up Details #1::        Patient notified of Rx's and given instructions. She verbalized understanding.  Additional Follow-up by: Janee Morn CMA (AAMA),  September 07, 2010 3:10 PM    New/Updated Medications: PREDNISONE 20 MG TABS (PREDNISONE) 3 for 3 days then 2 for 3 days then 1 for 3 days then 1/2 for 4 days TRIAMCINOLONE ACETONIDE 0.5 % OINT (TRIAMCINOLONE ACETONIDE) apply to AA two times a day x 2 wks, large OP Prescriptions: TRIAMCINOLONE ACETONIDE 0.5 % OINT (TRIAMCINOLONE ACETONIDE) apply to AA two times a day x 2 wks, large OP  #1 x 0   Entered and Authorized by:   Eustaquio Boyden  MD   Signed by:   Eustaquio Boyden  MD on 09/07/2010   Method used:   Electronically to        [facility]       [address]       [place], [place]  [address]       Ph: [phone]       Fax: [phone]   RxID:   [id number] PREDNISONE 20 MG TABS (PREDNISONE) 3 for 3 days then 2 for 3 days then 1 for 3 days then 1/2 for 4 days  #20 x 0   Entered and Authorized by:   Eustaquio Boyden   MD   Signed by:   Eustaquio Boyden  MD on 09/07/2010   Method used:   Electronically to        [facility]       [address]       [place], [place]  [address]       Ph: [phone]       Fax: [phone]   RxID:   [id number]

## 2010-09-20 ENCOUNTER — Ambulatory Visit: Payer: Self-pay | Admitting: Cardiovascular Disease

## 2010-11-10 NOTE — Op Note (Signed)
NAME:  Kathleen Good, Kathleen Good                          ACCOUNT NO.:  [id number]   MEDICAL RECORD NO.:  [id number]                   PATIENT TYPE:  OBV   LOCATION:  0445                                 FACILITY:  [facility]   PHYSICIAN:  Kerrin Champagne, M.D.                DATE OF BIRTH:  17-Oct-1960   DATE OF PROCEDURE:  12/21/2003  DATE OF DISCHARGE:  12/22/2003                                 OPERATIVE REPORT   PREOPERATIVE DIAGNOSIS:  Herniated nucleus pulposus central and right-sided,  C5-6.   POSTOPERATIVE DIAGNOSIS:  Herniated nucleus pulposus central and right-  sided, C5-6.  Spondylosis affecting the left C6 neural foramen.   OPERATION PERFORMED:  Anterior cervical diskectomy with excision of  herniated nucleus pulposus at C5-6 utilizing the operating room microscope,  then anterior fusion, C5-6 utilizing right iliac crest bone graft harvested  through a separate incision, internal fixation, C5-6, utilizing Depuy 25 mm,  locking plate with 12 mm screws.  Foraminotomy performed to the left C6  nerve root with excision of herniated nucleus pulposus central and right-  sided.   SURGEON:  Kerrin Champagne, M.D.   ASSISTANT:  Wende Neighbors, P.A.   ANESTHESIA:  General orotracheal anesthesia.  Jill Side, M.D.   ESTIMATED BLOOD LOSS:  25 mL.   DRAINS:  TLS drain, left neck.   INDICATIONS FOR PROCEDURE:  The patient is a 50 year old female with history  of progressive neck pain with radiation to the left arm in the C6  distribution, numbness in the thumb and index finger.  She has undergone  intensive attempts at conservative management including selective nerve root  blocks, physical therapy, without relief of pain.  She is brought to the  operating room after MRI studies had demonstrated herniated disk at the C5-6  with cord shift.  Myelograms demonstrated spondylosis changes within the  left C6 neural foramen, a combination of which appears to be causing a  traction type  contrecoup pathology to the left C6 nerve root.  She underwent  selective block which did not relieve her pain for more than 2 or 3 days.   INTRAOPERATIVE FINDINGS:  Herniated nucleus pulposus central and right-  sided.  This was into the spinal canal with fragment.  Left C6 uncovertebral  spondylosis with foraminal stenosis.   DESCRIPTION OF PROCEDURE:  After adequate general anesthesia, the patient in  a supine position a roll between the shoulder blades to make the anterior  neck prominent, neck in slight extension, 5 pounds cervical halter traction,  the [place] horse shoe was used well padded to hold the occiput.  The right  iliac crest with a bump under the crest to make prominent.  The patient  underwent prep over the anterior neck and right iliac crest with DuraPrep  solution, draped in the usual manner.  Iodine Vi-drape was used.  The  incision in line with  the  patient's skin crease at the expected C5-6 level  approximately 3 to 3.5 cm in length through the skin and subcutaneous layers  down to the platysmal layer.  This was incised in line with the skin  incision and spread.  Blunt dissection then used to spread the fascial  layers between the trachea and esophagus medially and the carotid sheath  laterally to expose the anterior aspect of the cervical spine and  prevertebral fascia.  The prevertebral fascia then cauterized using bipolar  electrocautery along the medial border of the longus colli muscle,  __________ across the midline using a Barista.  Hand held Cloward  was used to retract the soft tissues.  Finger palpation demonstrated the  spurs over the anterior aspect of the disk space at the C4-5 level.  Spinal  needle with about 1 cm protruding beyond the tip of the sleeve was then  inserted at the expected C5-6 level.  Intraoperative lateral radiograph  demonstrated the needle at the said C5-6 level on lateral radiograph.  Note  that during development of the  film, on cross table lateral, incision was  made over the right iliac crest approximately 3 to 4 cm in length through  the skin and subcutaneous layers, carried down to the superficial portion of  the anterolateral aspect of the right iliac crest without 3 to 4 cm  posterior to the anterior superior iliac spine.  The sensory branch of the  myocutaneous femoral nerve identified and preserved.  Subperiosteal  dissection then carried medially and laterally exposing the iliac crest over  the anterolateral aspect over about an inch and a half area.  This area then  packed and then returned to the cervical incision site. With the radiograph  demonstrating the appropriate level for surgery, hand held clowards were  used while the longus colli muscle was carefully teased back from the  anterior aspect of the cervical spine exposing the  anterolateral aspects of  the cervical spine at the C5-6 level.  A McCullough retractor was inserted  with the foot of the blade beneath the medial border of the longus colli  muscle exposing the anterior aspect of the cervical spine at the C5-6 level.  A 15 blade scalpel used to excise the disk  following removal of the needle  and removing a portion of the disk material  anteriorly for continued  identification throughout the procedure.   14 mm screw post then inserted into the vertebral body of C5 and C6,  distraction obtained across the disk space.  The intervertebral disk space  then debrided of the anterior two thirds of the disk using a 4-0 microcuret  as well as pituitary rongeurs, 3 mm Kerrisons excising the anterior lip  osteophytes.  A high speed bur used to similarly bur the anterior lip  osteophytes to where they were smoothed with the anterior aspect of the  vertebral body at C5-C6.  The operating room microscope draped and brought into the field sterilely under direct observation.  The posterior aspect of  the disk was debrided of disk material and  annular fibers down to the  posterior longitudinal ligament.  A high speed bur used to remove the  cartilaginous end plates at the inferior aspect of C5, superior aspect of C6  down bleeding bony end plates which were preserved.   Posterior lip osteophytes resected using a 1 mm Kerrison and the posterior  longitudinal ligament then excised centrally towards the right side with  disk materials encountered  subligamentous within the spinal canal on the  right side compressing the right side of the patient's thecal sac.  This  disk material was removed using micropituitary rongeurs as well as titanium  nerve hook.  Additionally, probing superior to the inferior posterior lip  osteophytes at C5, additional disk material was found.  Foraminotomy  performed on the right side at the C6 nerve root as well as the left side C6  nerve root excising uncovertebral spurs posterolaterally decompressing these  nerve roots nicely.  Following this, the height of the intervertebral disk  space was measured using the sounder to a #7 sounder.  The 7 mm dual  oscillating saw was then used to harvest bone graft from the right iliac  crest.  This was done protecting the soft tissue structures medially as well  as laterally.  Oscillating saw then used to cut depth about 1.5 cm.  Cloward  depth gauge had been used to measure the depth of the intervertebral disk  space at 15 mm.  Graft from the iliac crest was cut at its base using a 1/4  inch osteotome.  This graft was then carefully to dimensions of the  intervertebral disk space.  It was trimmed to 1 cm in depth, a height of 7  mm.  Carefully the ends rounded in order for T'ing of the graft.  Following  irrigation in the intervertebral disk space ensuring there was no further  active bleeding evident and no soft tissue present within the intervertebral  disk space, it could be retropulsed with insertion graft.  Graft was then  placed over the intervertebral disk  space, impacted into place, insetting it  about 1 or 2 mm.  Distraction was removed.  The screw posts were removed  from the vertebral body of C5-C6.  Bone wax applied to bleeding screw post  holes.  The 5  pound cervical halter traction was released.  25 mm locking  plate was then placed over the anterior aspect of the cervical spine and 12  mm screws were then used to fix the plate to the anterior aspect of the disk  space at the C5-6 level onto the body of C5 with two 12 mm screws and into  the body of C6 with two 12 mm screws.  Revision screws were used on the  right side as these approximated with the screw holes for the screw posts  used for distraction during the case.  Following this, irrigation was  performed.  Inspection of the esophagus demonstrated no abnormalities.  A 7  French TLS drain was then placed in the depth of the incision exiting inferior to the incision over the anterior neck.  Intraoperative radiograph  lateral demonstrated placement of screws and bone graft in excellent  position and alignment without evidence of retropulsion of graft material or  impingement of screws in the spinal canal.  With this then, the incision was  closed in the anterior neck using interrupted 3-0 Vicryl sutures to  approximate the platysma layer.  Deep subcu layer was reapproximated with  interrupted 3-0  Vicryl sutures and the skin closed with a running  subcutaneous stitch of 4-0 Vicryl.  Right iliac crest bone graft harvest  site was carefully irrigated, then closed after applying bone wax to  bleeding cancellous bone surfaces with hemostasis of the soft tissue  structures using electrocautery.  The fascial layers of the abdomen  approximated over the proximal thigh over the iliac crest with interrupted  #1  Vicryl sutures.  Deep subcu layers approximated with interrupted #1 and 0  Vicryl sutures.  More superficial layers with interrupted 2-0 Vicryl suture.  Skin closed with a running  subcutaneous stitch of 4-0 Vicryl.  Tincture of  benzoin and Steri-Strips applied to the right iliac crest.  Note that the  drain in the neck was sewn in place with a 4-0 nylon stitch. Tincture of  benzoin and Steri-Strips were applied to the neck.  4 x 4s fixed to the skin  with Hypafix tape to the neck and right iliac crest.  [place] collar  applied.  The patient then reactivated, extubated, returned to recovery room  in satisfactory condition.                                               Kerrin Champagne, M.D.    Myra Rude  D:  12/22/2003  T:  12/22/2003  Job:  [id number]

## 2010-11-11 ENCOUNTER — Emergency Department: Payer: Self-pay | Admitting: Emergency Medicine

## 2010-11-13 ENCOUNTER — Encounter: Payer: Self-pay | Admitting: Family Medicine

## 2010-11-14 ENCOUNTER — Ambulatory Visit: Payer: 59 | Admitting: Family Medicine

## 2010-11-21 ENCOUNTER — Ambulatory Visit: Payer: 59 | Admitting: Family Medicine

## 2010-11-22 ENCOUNTER — Encounter: Payer: Self-pay | Admitting: Family Medicine

## 2011-01-10 ENCOUNTER — Ambulatory Visit: Payer: 59 | Admitting: Family Medicine

## 2011-01-10 DIAGNOSIS — Z0289 Encounter for other administrative examinations: Secondary | ICD-10-CM

## 2011-01-11 ENCOUNTER — Encounter: Payer: Self-pay | Admitting: Family Medicine

## 2011-01-11 ENCOUNTER — Ambulatory Visit (INDEPENDENT_AMBULATORY_CARE_PROVIDER_SITE_OTHER): Payer: 59 | Admitting: Family Medicine

## 2011-01-11 VITALS — BP 140/90 | HR 72 | Temp 99.0°F | Wt 279.0 lb

## 2011-01-11 DIAGNOSIS — R358 Other polyuria: Secondary | ICD-10-CM

## 2011-01-11 DIAGNOSIS — R3589 Other polyuria: Secondary | ICD-10-CM

## 2011-01-11 DIAGNOSIS — R7309 Other abnormal glucose: Secondary | ICD-10-CM

## 2011-01-11 DIAGNOSIS — I10 Essential (primary) hypertension: Secondary | ICD-10-CM

## 2011-01-11 DIAGNOSIS — R7303 Prediabetes: Secondary | ICD-10-CM

## 2011-01-11 DIAGNOSIS — E785 Hyperlipidemia, unspecified: Secondary | ICD-10-CM

## 2011-01-11 DIAGNOSIS — R51 Headache: Secondary | ICD-10-CM

## 2011-01-11 LAB — POCT URINALYSIS DIPSTICK
Blood, UA: NEGATIVE
Glucose, UA: NEGATIVE
Ketones, UA: NEGATIVE
Nitrite, UA: NEGATIVE
Protein, UA: 30
Spec Grav, UA: 1.025
Urobilinogen, UA: 0.2
pH, UA: 6

## 2011-01-11 MED ORDER — CYCLOBENZAPRINE HCL 10 MG PO TABS: 10.0000 mg | ORAL_TABLET | Freq: Two times a day (BID) | ORAL | Status: DC | PRN

## 2011-01-11 NOTE — Assessment & Plan Note (Signed)
Has had mildly elevated in past, states currently increasing due to stressors. If staying >!40/90, start ACEI.

## 2011-01-11 NOTE — Patient Instructions (Addendum)
Return at your convenience fasting for blood work to check diabetes and other issues. Return for physical at your convenience. Flexeril sent to pharmacy - take to help stop headaches. Good to see you today. Change primary doctors to myself - ask up front.
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
## 2011-01-11 NOTE — Assessment & Plan Note (Signed)
Due for blood work as well as CPE. Return fasting for both.
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
## 2011-01-11 NOTE — Assessment & Plan Note (Signed)
UA/micro likely contamination.  Have had pt recollect clean catch, sent culture. No glucose in urine.

## 2011-01-11 NOTE — Assessment & Plan Note (Signed)
Endorsing polyuria and dypsia as well as other sxs, h/o GDM, obesity.  Prediabetic.  High risk for developing diabetes. Check blood work when returns fasting. Consider starting metformin.

## 2011-01-12 ENCOUNTER — Other Ambulatory Visit (INDEPENDENT_AMBULATORY_CARE_PROVIDER_SITE_OTHER): Payer: 59 | Admitting: Family Medicine

## 2011-01-12 DIAGNOSIS — R7303 Prediabetes: Secondary | ICD-10-CM

## 2011-01-12 DIAGNOSIS — R7309 Other abnormal glucose: Secondary | ICD-10-CM

## 2011-01-12 DIAGNOSIS — R51 Headache: Secondary | ICD-10-CM

## 2011-01-12 DIAGNOSIS — I10 Essential (primary) hypertension: Secondary | ICD-10-CM

## 2011-01-12 DIAGNOSIS — E785 Hyperlipidemia, unspecified: Secondary | ICD-10-CM

## 2011-01-12 LAB — COMPREHENSIVE METABOLIC PANEL
ALT: 15 U/L (ref 0–35)
AST: 18 U/L (ref 0–37)
Albumin: 4 g/dL (ref 3.5–5.2)
Alkaline Phosphatase: 67 U/L (ref 39–117)
BUN: 9 mg/dL (ref 6–23)
CO2: 24 mEq/L (ref 19–32)
Calcium: 8.2 mg/dL — ABNORMAL LOW (ref 8.4–10.5)
Chloride: 103 mEq/L (ref 96–112)
Creatinine, Ser: 0.7 mg/dL (ref 0.4–1.2)
GFR: 103.39 mL/min (ref >60.00)
Glucose, Bld: 101 mg/dL — ABNORMAL HIGH (ref 70–99)
Potassium: 3.5 mEq/L (ref 3.5–5.1)
Sodium: 137 mEq/L (ref 135–145)
Total Bilirubin: 0.4 mg/dL (ref 0.3–1.2)
Total Protein: 8.2 g/dL (ref 6.0–8.3)

## 2011-01-12 LAB — CBC WITH DIFFERENTIAL/PLATELET
Basophils Absolute: 0.1 10*3/uL (ref 0.0–0.1)
Basophils Relative: 0.9 % (ref 0.0–3.0)
Eosinophils Absolute: 0.2 10*3/uL (ref 0.0–0.7)
Eosinophils Relative: 3.4 % (ref 0.0–5.0)
HCT: 35.4 % — ABNORMAL LOW (ref 36.0–46.0)
Hemoglobin: 11.8 g/dL — ABNORMAL LOW (ref 12.0–15.0)
Lymphocytes Relative: 29.1 % (ref 12.0–46.0)
Lymphs Abs: 2 10*3/uL (ref 0.7–4.0)
MCHC: 33.2 g/dL (ref 30.0–36.0)
MCV: 84.8 fl (ref 78.0–100.0)
Monocytes Absolute: 0.7 10*3/uL (ref 0.1–1.0)
Monocytes Relative: 9.3 % (ref 3.0–12.0)
Neutro Abs: 4 10*3/uL (ref 1.4–7.7)
Neutrophils Relative %: 57.3 % (ref 43.0–77.0)
Platelets: 324 10*3/uL (ref 150.0–400.0)
RBC: 4.18 Mil/uL (ref 3.87–5.11)
RDW: 17.5 % — ABNORMAL HIGH (ref 11.5–14.6)
WBC: 7 10*3/uL (ref 4.5–10.5)

## 2011-01-12 LAB — LDL CHOLESTEROL, DIRECT: Direct LDL: 230.3 mg/dL

## 2011-01-12 LAB — LIPID PANEL
Cholesterol: 281 mg/dL — ABNORMAL HIGH (ref 0–200)
HDL: 40.7 mg/dL (ref >39.00)
Total CHOL/HDL Ratio: 7
Triglycerides: 148 mg/dL (ref 0.0–149.0)
VLDL: 29.6 mg/dL (ref 0.0–40.0)

## 2011-01-12 LAB — HEMOGLOBIN A1C: Hgb A1c MFr Bld: 6.8 % — ABNORMAL HIGH (ref 4.6–6.5)

## 2011-01-12 LAB — TSH: TSH: 0.91 u[IU]/mL (ref 0.35–5.50)

## 2011-01-13 LAB — URINE CULTURE: Colony Count: 45000

## 2011-01-15 ENCOUNTER — Telehealth: Payer: Self-pay | Admitting: *Deleted

## 2011-01-15 NOTE — Telephone Encounter (Signed)
Opened in error

## 2011-01-16 ENCOUNTER — Encounter: Payer: Self-pay | Admitting: Family Medicine

## 2011-01-16 ENCOUNTER — Ambulatory Visit (INDEPENDENT_AMBULATORY_CARE_PROVIDER_SITE_OTHER): Payer: 59 | Admitting: Family Medicine

## 2011-01-16 ENCOUNTER — Telehealth: Payer: Self-pay | Admitting: Family Medicine

## 2011-01-16 VITALS — BP 122/80 | HR 84 | Temp 99.2°F | Wt 279.0 lb

## 2011-01-16 DIAGNOSIS — Z1231 Encounter for screening mammogram for malignant neoplasm of breast: Secondary | ICD-10-CM

## 2011-01-16 DIAGNOSIS — R7309 Other abnormal glucose: Secondary | ICD-10-CM

## 2011-01-16 DIAGNOSIS — I10 Essential (primary) hypertension: Secondary | ICD-10-CM

## 2011-01-16 DIAGNOSIS — D649 Anemia, unspecified: Secondary | ICD-10-CM

## 2011-01-16 DIAGNOSIS — R7303 Prediabetes: Secondary | ICD-10-CM

## 2011-01-16 DIAGNOSIS — Z0000 Encounter for general adult medical examination without abnormal findings: Secondary | ICD-10-CM | POA: Insufficient documentation

## 2011-01-16 DIAGNOSIS — E785 Hyperlipidemia, unspecified: Secondary | ICD-10-CM

## 2011-01-16 DIAGNOSIS — Z0001 Encounter for general adult medical examination with abnormal findings: Secondary | ICD-10-CM | POA: Insufficient documentation

## 2011-01-16 MED ORDER — ATORVASTATIN CALCIUM 40 MG PO TABS: 40.0000 mg | ORAL_TABLET | Freq: Every day | ORAL | Status: DC

## 2011-01-16 MED ORDER — METFORMIN HCL 500 MG PO TABS: 500.0000 mg | ORAL_TABLET | Freq: Two times a day (BID) | ORAL | Status: DC

## 2011-01-16 NOTE — Assessment & Plan Note (Signed)
A1c 6.8%.  Concern for developing diabetes. Start metformin qhs and return in 3 mo for f/u.  Discussed watching for GI upset first few days of med. Hold off on checking sugars at home for now. Recheck A1c then,  Lab Results  Component Value Date   CREATININE 0.7 01/12/2011

## 2011-01-16 NOTE — Assessment & Plan Note (Signed)
LDL 200s, too high given fmhx (Father) and elevated A1c. increase lipitor to 40mg  nightly. Watch for myalgias with dilt and lipitor
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
## 2011-01-16 NOTE — Telephone Encounter (Signed)
Please notify colonoscopy record reviewed, one polyp found, done 03/2005. Due for rpt this year. Have her call GI to see if can schedule, if trouble let us know to help schedule.

## 2011-01-16 NOTE — Assessment & Plan Note (Signed)
Preventative protocols discussed. UTD tetanus. Breast exam reassuring, set up with mammo. To return for pap smear per pt preference. Will request records of colonoscopy given h/o polyps, may need rpt this year or next year. Contraception: BTL

## 2011-01-16 NOTE — Assessment & Plan Note (Signed)
Better today. Diltiazem CD daily.

## 2011-01-16 NOTE — Patient Instructions (Addendum)
I will request records from [facility] on latest colonoscopy and to see when you're due next. A1c was elevated at 6.8%.  Start metformin 500mg  nightly to help control sugar level. Concern for developing diabetes.  No need to check sugars for now, but we will want to keep close eye on A1c each visit. Lipitor increased to 40mg  nightly. Pass by Marion's office to schedule mammogram. Breast exam today. Return at your convenience for pap. Return in 3 months to follow sugar and cholesterol, fasting for blood work prior.

## 2011-01-16 NOTE — Assessment & Plan Note (Signed)
New - RDW elevated, anticipate iron deficiency.  Check panel next blood draw. Due for colonoscopy - will likely set up depending on when due, have requested records. Endorsing heavy cycle flow, anticipate anemia from this.

## 2011-01-17 ENCOUNTER — Encounter: Payer: Self-pay | Admitting: Internal Medicine

## 2011-01-18 NOTE — Telephone Encounter (Signed)
Message left notifying patient that colonoscopy is due this year. Instructed her to call GI and set up appointment and to call me if she has any trouble with scheduling it.

## 2011-01-24 ENCOUNTER — Ambulatory Visit: Payer: 59

## 2011-02-19 ENCOUNTER — Other Ambulatory Visit: Payer: 59 | Admitting: Internal Medicine

## 2011-02-20 ENCOUNTER — Ambulatory Visit: Payer: 59

## 2011-02-21 ENCOUNTER — Other Ambulatory Visit: Payer: Self-pay | Admitting: *Deleted

## 2011-02-21 MED ORDER — HYDROXYZINE HCL 25 MG PO TABS: 25.0000 mg | ORAL_TABLET | Freq: Three times a day (TID) | ORAL | Status: AC | PRN

## 2011-02-21 MED ORDER — PREDNISONE 20 MG PO TABS: ORAL_TABLET | ORAL | Status: DC

## 2011-02-21 NOTE — Telephone Encounter (Signed)
Patient notified. She will monitor her sugars closely due to the steroids. She will follow up with derm if no improvement or worsening.

## 2011-02-21 NOTE — Telephone Encounter (Signed)
Seen for this 08/14/2010. Treated with prednisone course as well as hydroxyzine.  Sugar will rise with steroids. May retreat with same regimen. If not improving, or any worsening, will recommend referral back to derm (Dr. Terri Piedra?)

## 2011-02-21 NOTE — Telephone Encounter (Signed)
Pt is asking if she can have a refill on prednisone and hydroxyzine that she has taken for a chronic dermatitis that she has.  She was here for a physical last month, but hasnt been see for the rash in several months.  Uses [facility].

## 2011-02-23 ENCOUNTER — Other Ambulatory Visit: Payer: 59 | Admitting: Internal Medicine

## 2011-04-11 ENCOUNTER — Other Ambulatory Visit: Payer: Self-pay | Admitting: Family Medicine

## 2011-04-11 DIAGNOSIS — E785 Hyperlipidemia, unspecified: Secondary | ICD-10-CM

## 2011-04-11 DIAGNOSIS — R7303 Prediabetes: Secondary | ICD-10-CM

## 2011-04-11 DIAGNOSIS — D649 Anemia, unspecified: Secondary | ICD-10-CM

## 2011-04-11 DIAGNOSIS — I10 Essential (primary) hypertension: Secondary | ICD-10-CM

## 2011-04-16 ENCOUNTER — Other Ambulatory Visit: Payer: 59

## 2011-04-19 ENCOUNTER — Ambulatory Visit: Payer: 59 | Admitting: Family Medicine

## 2011-04-19 DIAGNOSIS — Z0289 Encounter for other administrative examinations: Secondary | ICD-10-CM

## 2011-05-10 ENCOUNTER — Ambulatory Visit: Payer: 59

## 2011-07-06 ENCOUNTER — Other Ambulatory Visit: Payer: Self-pay | Admitting: Family Medicine

## 2011-07-06 ENCOUNTER — Other Ambulatory Visit: Payer: Self-pay | Admitting: Cardiovascular Disease

## 2011-07-06 NOTE — Telephone Encounter (Signed)
Refill sent for diltiazem

## 2011-07-06 NOTE — Telephone Encounter (Signed)
[facility] request refill Inderal 20 mg #180 x 0. Prozac 40 mg #90 x 0 and Omeprazole 20 mg #60 x 0 with note pt needs to call for appt. Pt seen 01/16/11 but was no show for f/u 10/12.

## 2011-09-07 ENCOUNTER — Ambulatory Visit (INDEPENDENT_AMBULATORY_CARE_PROVIDER_SITE_OTHER): Payer: 59 | Admitting: Family Medicine

## 2011-09-07 ENCOUNTER — Encounter: Payer: Self-pay | Admitting: Family Medicine

## 2011-09-07 VITALS — BP 140/94 | HR 92 | Temp 99.0°F | Wt 299.2 lb

## 2011-09-07 DIAGNOSIS — F17200 Nicotine dependence, unspecified, uncomplicated: Secondary | ICD-10-CM

## 2011-09-07 DIAGNOSIS — E669 Obesity, unspecified: Secondary | ICD-10-CM

## 2011-09-07 DIAGNOSIS — E785 Hyperlipidemia, unspecified: Secondary | ICD-10-CM

## 2011-09-07 DIAGNOSIS — D649 Anemia, unspecified: Secondary | ICD-10-CM

## 2011-09-07 DIAGNOSIS — R7309 Other abnormal glucose: Secondary | ICD-10-CM

## 2011-09-07 DIAGNOSIS — R51 Headache: Secondary | ICD-10-CM

## 2011-09-07 DIAGNOSIS — R079 Chest pain, unspecified: Secondary | ICD-10-CM

## 2011-09-07 DIAGNOSIS — I10 Essential (primary) hypertension: Secondary | ICD-10-CM

## 2011-09-07 DIAGNOSIS — R7303 Prediabetes: Secondary | ICD-10-CM

## 2011-09-07 DIAGNOSIS — Z86010 Personal history of colonic polyps: Secondary | ICD-10-CM

## 2011-09-07 MED ORDER — FLUOXETINE HCL 40 MG PO CAPS: 40.0000 mg | ORAL_CAPSULE | Freq: Every day | ORAL | Status: DC

## 2011-09-07 MED ORDER — OMEPRAZOLE 20 MG PO CPDR: 20.0000 mg | DELAYED_RELEASE_CAPSULE | Freq: Two times a day (BID) | ORAL | Status: DC

## 2011-09-07 MED ORDER — PROPRANOLOL HCL 20 MG PO TABS: 40.0000 mg | ORAL_TABLET | Freq: Three times a day (TID) | ORAL | Status: DC

## 2011-09-07 MED ORDER — ATORVASTATIN CALCIUM 40 MG PO TABS: 40.0000 mg | ORAL_TABLET | Freq: Every day | ORAL | Status: DC

## 2011-09-07 MED ORDER — CYCLOBENZAPRINE HCL 10 MG PO TABS: 10.0000 mg | ORAL_TABLET | Freq: Two times a day (BID) | ORAL | Status: DC | PRN

## 2011-09-07 MED ORDER — DILTIAZEM HCL ER COATED BEADS 180 MG PO CP24: 180.0000 mg | ORAL_CAPSULE | Freq: Every day | ORAL | Status: DC

## 2011-09-07 NOTE — Patient Instructions (Addendum)
We will check blood work today. If sugar staying high, we could talk about extended release metformin as often better tolerated. Increase diltiazem to 180mg  daily - new prescription sent in. i've refilled flexeril and other meds. Pass by Marion's office to schedule colonoscopy and referral to cardiology for reevaluation of chest pain and shortness of breath. We will check blood work today. Return to see me in 4 months for repeat physical (July). Good to see you today, call us with questions.

## 2011-09-07 NOTE — Progress Notes (Signed)
Subjective:    Patient ID: Kathleen Good, female    DOB: 1961-06-16, 51 y.o.   MRN: [id number]  HPI CC: f/u   Last seen 12/2010 for CPE.  Did not return for 3 mo f/u.   Stress recently - cousin passed away.  Has been neglecting her own care.   Needs to reschedule colonoscopy, had to cancel last appointment.  H/o polyps, due for rpt last year.  Smoking - <1ppd.  Did quit for 1 mo but then increased stress led to restarting.  H/o migraines - no change recently.  Saw several headache centers in past.  Has had botox, facet injections as well as multiple other treatments.  Not interested in returning to [facility] center.  Flexeril has helped, requests refill.  Also on propranolol tid ppx.  HTN - no vision changes.  Compliant with cardizem CD 120mg  QD.  Also on propranolol.  bp running elevated today as well as at home.  Chest pain - Noting chest tightness and shortness of breath with walking - notes this when walking 3 dogs.  Rest improves pain.  Longstanding sxs.  Similar sxs led to ER eval 04/2010 with normal stress echo.  Reviewed last cards note as well - seen or tachycardias in past, startd on cardizem and had discussed holter monitor.  Never returned for f/u.  Continues to endorse tachycardias worse with sexhertion. BP Readings from Last 3 Encounters:  09/07/11 140/94  01/16/11 122/80  01/11/11 140/90   DM - dx 12/2010.  Took metformin for 2-3 mo but significant stomach upset - cramping, diarrhea.  Stopped this.  Doesn't check sugars.  not fasting today.  Due for repeat vision screen. Lab Results  Component Value Date   HGBA1C 6.8* 01/12/2011   Lab Results  Component Value Date   TSH 0.91 01/12/2011   Wt Readings from Last 3 Encounters:  09/07/11 299 lb 4 oz (135.739 kg)  01/16/11 279 lb (126.554 kg)  01/11/11 279 lb (126.554 kg)   Last CPE - 12/2010.  Past Medical History  Diagnosis Date  . Depression   . GERD (gastroesophageal reflux disease)   . Hypertension   . Hyperglycemia     . Obesity   . Tobacco abuse   . Abdominal pain, unspecified site   . Chest pain, unspecified   . Other malaise and fatigue   . Diffuse cystic mastopathy   . Other specified disease of hair and hair follicles   . Calcaneal spur   . Pure hypercholesterolemia   . Undiagnosed cardiac murmurs   . Personal history of colonic polyps   . Rash and other nonspecific skin eruption   . Symptoms involving cardiovascular system     Tachycardia   Review of Systems Per HPI    Objective:   Physical Exam  Nursing note and vitals reviewed. Constitutional: She appears well-developed and well-nourished. No distress.  HENT:  Head: Normocephalic and atraumatic.  Right Ear: External ear normal.  Mouth/Throat: Oropharynx is clear and moist. No oropharyngeal exudate.  Eyes: Conjunctivae and EOM are normal. Pupils are equal, round, and reactive to light. No scleral icterus.  Neck: Normal range of motion. Neck supple.  Cardiovascular: Normal rate, regular rhythm, normal heart sounds and intact distal pulses.   No murmur heard. Pulmonary/Chest: Effort normal and breath sounds normal. No respiratory distress. She has no wheezes. She has no rales.       Somewhat coarse  Abdominal: Soft. Bowel sounds are normal. She exhibits no distension. There is no  tenderness. There is no rebound and no guarding.  Skin: Skin is warm and dry.  Psychiatric: She has a normal mood and affect.       Assessment & Plan:

## 2011-09-08 ENCOUNTER — Encounter: Payer: Self-pay | Admitting: Family Medicine

## 2011-09-08 DIAGNOSIS — R519 Headache, unspecified: Secondary | ICD-10-CM | POA: Insufficient documentation

## 2011-09-08 DIAGNOSIS — M5030 Other cervical disc degeneration, unspecified cervical region: Secondary | ICD-10-CM | POA: Insufficient documentation

## 2011-09-08 DIAGNOSIS — R51 Headache: Secondary | ICD-10-CM

## 2011-09-08 LAB — CBC WITH DIFFERENTIAL/PLATELET
Basophils Absolute: 0 10*3/uL (ref 0.0–0.1)
Basophils Relative: 1 % (ref 0–1)
Eosinophils Absolute: 0.2 10*3/uL (ref 0.0–0.7)
Eosinophils Relative: 3 % (ref 0–5)
HCT: 36.8 % (ref 36.0–46.0)
Hemoglobin: 11.1 g/dL — ABNORMAL LOW (ref 12.0–15.0)
Lymphocytes Relative: 35 % (ref 12–46)
Lymphs Abs: 2.8 10*3/uL (ref 0.7–4.0)
MCH: 26.1 pg (ref 26.0–34.0)
MCHC: 30.2 g/dL (ref 30.0–36.0)
MCV: 86.6 fL (ref 78.0–100.0)
Monocytes Absolute: 0.6 10*3/uL (ref 0.1–1.0)
Monocytes Relative: 8 % (ref 3–12)
Neutro Abs: 4.4 10*3/uL (ref 1.7–7.7)
Neutrophils Relative %: 55 % (ref 43–77)
Platelets: 359 10*3/uL (ref 150–400)
RBC: 4.25 MIL/uL (ref 3.87–5.11)
RDW: 17.2 % — ABNORMAL HIGH (ref 11.5–15.5)
WBC: 8 10*3/uL (ref 4.0–10.5)

## 2011-09-08 LAB — HEMOGLOBIN A1C
Hgb A1c MFr Bld: 6.8 % — ABNORMAL HIGH (ref <5.7)
Mean Plasma Glucose: 148 mg/dL — ABNORMAL HIGH (ref <117)

## 2011-09-08 LAB — FERRITIN: Ferritin: 7 ng/mL — ABNORMAL LOW (ref 10–291)

## 2011-09-08 LAB — IBC PANEL

## 2011-09-08 LAB — TSH: TSH: 1.458 u[IU]/mL (ref 0.350–4.500)

## 2011-09-08 NOTE — Assessment & Plan Note (Signed)
Continue to encourage cessation. Pt did quit for several months in last year. Stress caused her to restart.

## 2011-09-08 NOTE — Assessment & Plan Note (Signed)
Pt brings records from prior [facility] evaluation (Dr. Vela Prose). Reviewed and input into chart. Refilled flexeril.

## 2011-09-08 NOTE — Assessment & Plan Note (Signed)
Endorsing continued angina however evaluation with stress echo 04/2010 WNL, longstanding sxs. Recommend restart baby ASA. May just be deconditioning (20lb weight gain over last 6 mo) but do recommend pt f/u with cardiology for continued chest pain and endorsed tachcyardia with significant risk factors (obesity, DM, continued smoking, HLD, HTN.)

## 2011-09-08 NOTE — Assessment & Plan Note (Signed)
Endorses heavy periods.  Last Hgb (12/2010) 11.8, elevated RDW.  recheck today along with iron levels. Due for colonoscopy - again rec reschedule.

## 2011-09-08 NOTE — Assessment & Plan Note (Signed)
BP elevated today, states runs high at home. Increase diltiazem CD to 180mg  daily - new refill sent in. Consider addition of ACEI given DM.

## 2011-09-08 NOTE — Assessment & Plan Note (Signed)
Asked to schedule f/u colonoscopy.

## 2011-09-08 NOTE — Assessment & Plan Note (Signed)
LDL 230 last check - too high. Increased lipitor to 40mg  daily 12/2010. FLP today.

## 2011-09-08 NOTE — Assessment & Plan Note (Signed)
Does have DM with last A1c 6.8% - did not tolerate metformin, has not been watching diet. Check A1c today. Consider xr metformin. Does not check sugars at home.

## 2011-09-11 LAB — BASIC METABOLIC PANEL
BUN: 8 mg/dL (ref 6–23)
CO2: 24 mEq/L (ref 19–32)
Calcium: 8.4 mg/dL (ref 8.4–10.5)
Chloride: 104 mEq/L (ref 96–112)
Creat: 0.71 mg/dL (ref 0.50–1.10)
Glucose, Bld: 96 mg/dL (ref 70–99)
Potassium: 4.5 mEq/L (ref 3.5–5.3)
Sodium: 137 mEq/L (ref 135–145)

## 2011-09-11 LAB — IRON AND TIBC
%SAT: 11 % — ABNORMAL LOW (ref 20–55)
Iron: 49 ug/dL (ref 42–145)
TIBC: 445 ug/dL (ref 250–470)
UIBC: 396 ug/dL (ref 125–400)

## 2011-09-11 LAB — LIPID PANEL
Cholesterol: 283 mg/dL — ABNORMAL HIGH (ref 0–200)
HDL: 37 mg/dL — ABNORMAL LOW (ref >39)
LDL Cholesterol: 200 mg/dL — ABNORMAL HIGH (ref 0–99)
Total CHOL/HDL Ratio: 7.6 Ratio
Triglycerides: 232 mg/dL — ABNORMAL HIGH (ref <150)
VLDL: 46 mg/dL — ABNORMAL HIGH (ref 0–40)

## 2011-09-12 ENCOUNTER — Other Ambulatory Visit: Payer: Self-pay | Admitting: Family Medicine

## 2011-09-12 MED ORDER — FERROUS SULFATE 325 (65 FE) MG PO TABS: 325.0000 mg | ORAL_TABLET | Freq: Every day | ORAL | Status: DC

## 2011-09-13 ENCOUNTER — Encounter: Payer: Self-pay | Admitting: Cardiovascular Disease

## 2011-09-13 ENCOUNTER — Ambulatory Visit (INDEPENDENT_AMBULATORY_CARE_PROVIDER_SITE_OTHER): Payer: 59 | Admitting: Cardiovascular Disease

## 2011-09-13 VITALS — BP 143/95 | HR 89 | Ht 65.0 in | Wt 299.8 lb

## 2011-09-13 DIAGNOSIS — R0989 Other specified symptoms and signs involving the circulatory and respiratory systems: Secondary | ICD-10-CM

## 2011-09-13 DIAGNOSIS — E119 Type 2 diabetes mellitus without complications: Secondary | ICD-10-CM

## 2011-09-13 DIAGNOSIS — F17200 Nicotine dependence, unspecified, uncomplicated: Secondary | ICD-10-CM

## 2011-09-13 DIAGNOSIS — R011 Cardiac murmur, unspecified: Secondary | ICD-10-CM

## 2011-09-13 DIAGNOSIS — E669 Obesity, unspecified: Secondary | ICD-10-CM

## 2011-09-13 DIAGNOSIS — R0602 Shortness of breath: Secondary | ICD-10-CM

## 2011-09-13 DIAGNOSIS — I10 Essential (primary) hypertension: Secondary | ICD-10-CM

## 2011-09-13 DIAGNOSIS — R079 Chest pain, unspecified: Secondary | ICD-10-CM

## 2011-09-13 DIAGNOSIS — E785 Hyperlipidemia, unspecified: Secondary | ICD-10-CM

## 2011-09-13 MED ORDER — FUROSEMIDE 20 MG PO TABS: 20.0000 mg | ORAL_TABLET | Freq: Every day | ORAL | Status: AC | PRN

## 2011-09-13 MED ORDER — PROPRANOLOL HCL 20 MG PO TABS: 40.0000 mg | ORAL_TABLET | Freq: Three times a day (TID) | ORAL | Status: DC | PRN

## 2011-09-13 MED ORDER — METOPROLOL SUCCINATE ER 100 MG PO TB24: 200.0000 mg | ORAL_TABLET | Freq: Every day | ORAL | Status: DC

## 2011-09-13 NOTE — Progress Notes (Signed)
Patient ID: Kathleen Good, female    DOB: 1961-02-04, 51 y.o.   MRN: [id number]  HPI Comments: Kathleen Good is a pleasant 51 year old woman with a history of morbid  obesity, smoking history for 14 years who continues to smoke , neck fusion, shoulder repair with evaluation at [facility] in November 2011 for chest pain and palpitations and shortness of breath, normal EKG, no ischemia on stress echo, hypertensive response to exercise, who presents for routine followup.   She had a stress echo  where she achieved a heart rate of 165 beats per minute, had a significant hypertensive response with systolic pressures of 233/102. Her study was normal with normal echo and no wall motion abnormality with no significant EKG changes concerning for ischemia.   She reports that she walks 2 dogs on a regular basis and notices tachycardia/palpitations on exertion. She was doing well on diltiazem and propranolol and is more symptomatic recently. Low-dose diltiazem 120 mg daily was started on her last clinic visit. This was recently increased to 180 mg daily and she has not noticed a significant improvement. She does have trace lower extremity edema on a regular basis which was there prior to starting the calcium channel blocker.   Her weight is up 30 pounds from her last clinic visit. She has put herself back on a diet. States  EKG shows normal sinus rhythm with rate of 88  beats per minute, no significant ST-T wave changes      Outpatient Encounter Prescriptions as of 09/13/2011  Medication Sig Dispense Refill  . aspirin 81 MG EC tablet Take 81 mg by mouth daily.        Marland Kitchen atorvastatin (LIPITOR) 40 MG tablet Take 1 tablet (40 mg total) by mouth daily.  90 tablet  1  . cyclobenzaprine (FLEXERIL) 10 MG tablet Take 1 tablet (10 mg total) by mouth 2 (two) times daily as needed (migraines).  60 tablet  3  . diltiazem (CARDIZEM CD) 180 MG 24 hr capsule Take 1 capsule (180 mg total) by mouth daily.  90 capsule   3  . FLUoxetine (PROZAC) 40 MG capsule Take 1 capsule (40 mg total) by mouth daily.  90 capsule  1  . omeprazole (PRILOSEC) 20 MG capsule Take 1 capsule (20 mg total) by mouth 2 (two) times daily.  60 capsule  6  . propranolol (INDERAL) 20 MG tablet Take 2 tablets (40 mg total) by mouth 3 (three) times daily.  180 tablet  6  . furosemide (LASIX) 20 MG tablet Take 1 tablet (20 mg total) by mouth daily as needed.  30 tablet  6    Review of Systems  Constitutional: Positive for unexpected weight change.  HENT: Negative.   Eyes: Negative.   Respiratory: Positive for shortness of breath.   Cardiovascular: Positive for palpitations.  Gastrointestinal: Negative.   Musculoskeletal: Negative.   Skin: Negative.   Neurological: Negative.   Hematological: Negative.   Psychiatric/Behavioral: Negative.   All other systems reviewed and are negative.    BP 143/95  Pulse 89  Ht 5\' 5"  (1.651 m)  Wt 299 lb 12.8 oz (135.988 kg)  BMI 49.89 kg/m2  Physical Exam  Nursing note and vitals reviewed. Constitutional: She is oriented to person, place, and time. She appears well-developed and well-nourished.       Morbidly obese  HENT:  Head: Normocephalic.  Nose: Nose normal.  Mouth/Throat: Oropharynx is clear and moist.  Eyes: Conjunctivae are normal. Pupils are equal,  round, and reactive to light.  Neck: Normal range of motion. Neck supple. No JVD present.  Cardiovascular: Normal rate, regular rhythm, S1 normal, S2 normal, normal heart sounds and intact distal pulses.  Exam reveals no gallop and no friction rub.   No murmur heard. Pulmonary/Chest: Effort normal and breath sounds normal. No respiratory distress. She has no wheezes. She has no rales. She exhibits no tenderness.  Abdominal: Soft. Bowel sounds are normal. She exhibits no distension. There is no tenderness.  Musculoskeletal: Normal range of motion. She exhibits no edema and no tenderness.  Lymphadenopathy:    She has no cervical  adenopathy.  Neurological: She is alert and oriented to person, place, and time. Coordination normal.  Skin: Skin is warm and dry. No rash noted. No erythema.  Psychiatric: She has a normal mood and affect. Her behavior is normal. Judgment and thought content normal.         Assessment and Plan

## 2011-09-13 NOTE — Assessment & Plan Note (Signed)
We have encouraged continued exercise, careful diet management in an effort to lose weight. We did discuss gastric bypass and lap banding.

## 2011-09-13 NOTE — Assessment & Plan Note (Signed)
Shortness of breath is likely secondary to obesity, continued smoking. She does have trace edema which waxes and wanes. Unable to exclude mild pulmonary hypertension/diastolic dysfunction from her obesity. We have given her Lasix to take when necessary for worsening edema or shortness of breath. If she takes Lasix on a regular basis, we have asked her to call our office for periodic blood work.

## 2011-09-13 NOTE — Assessment & Plan Note (Signed)
We have encouraged her to continue to work on weaning her cigarettes and smoking cessation. She will continue to work on this and does not want any assistance with chantix.  

## 2011-09-13 NOTE — Assessment & Plan Note (Signed)
We have suggested she stay on her Lipitor. 

## 2011-09-13 NOTE — Patient Instructions (Signed)
You are doing well. Please hold the propranolol (ok to take as needed) Start metoprolol one or two in the morning for heart rate control Stay on the diltiazem  Monitor your heart rate  Please call us if you have new issues that need to be addressed before your next appt.  Your physician wants you to follow-up in: 6 months.  You will receive a reminder letter in the mail two months in advance. If you don't receive a letter, please call our office to schedule the follow-up appointment.

## 2011-09-13 NOTE — Assessment & Plan Note (Signed)
Blood pressure is mildly elevated on today's visit. We will change propranolol to metoprolol succinate as detailed. If blood pressure continues to be elevated on a regular basis, we could have her start HCTZ daily.

## 2011-09-13 NOTE — Assessment & Plan Note (Signed)
I suspect her tachycardia with exertion is multifactorial including her morbid obesity, deconditioning, continued smoking. She has had recent 30 pound weight gain over the past year. In an effort to control her heart rate, we have stressed the importance of diet and weight loss.   We will hold her propranolol and start metoprolol succinate 100 mg daily, titrating up to 200 mg daily if needed for heart rate control. She can take propranolol when necessary. We have suggested she continue the diltiazem 180 mg daily.

## 2011-09-19 ENCOUNTER — Encounter: Payer: Self-pay | Admitting: Family Medicine

## 2011-09-19 ENCOUNTER — Ambulatory Visit (INDEPENDENT_AMBULATORY_CARE_PROVIDER_SITE_OTHER): Payer: 59 | Admitting: Family Medicine

## 2011-09-19 VITALS — BP 120/80 | HR 84 | Temp 99.5°F | Wt 300.0 lb

## 2011-09-19 DIAGNOSIS — J069 Acute upper respiratory infection, unspecified: Secondary | ICD-10-CM

## 2011-09-19 DIAGNOSIS — J45909 Unspecified asthma, uncomplicated: Secondary | ICD-10-CM | POA: Insufficient documentation

## 2011-09-19 MED ORDER — AZITHROMYCIN 250 MG PO TABS: ORAL_TABLET | ORAL | Status: AC

## 2011-09-19 MED ORDER — ALBUTEROL SULFATE HFA 108 (90 BASE) MCG/ACT IN AERS: 2.0000 | INHALATION_SPRAY | Freq: Four times a day (QID) | RESPIRATORY_TRACT | Status: DC | PRN

## 2011-09-19 MED ORDER — BENZONATATE 100 MG PO CAPS: 100.0000 mg | ORAL_CAPSULE | Freq: Three times a day (TID) | ORAL | Status: AC | PRN

## 2011-09-19 NOTE — Patient Instructions (Addendum)
I think you have bronchitis. Take Zpack as directed.  Drink lots of fluids.  Treat sympotmatically with Mucinex, nasal saline irrigation, and Tylenol/Ibuprofen. Proair inhaler as needed for severe shortness of breath (do not take with propranololo or metoprolole).  Cough suppressant as needed- up to three times daily. Call if not improving as expected in 5-7 days.

## 2011-09-19 NOTE — Progress Notes (Signed)
Patient ID: Kathleen Good, female    DOB: 11/20/1960, 51 y.o.   MRN: [id number]  HPI Comments:   51 yo pt of Dr. Reece Agar, new to me with complication medical history including morbid  obesity, smoking history for 14 years who continues to smoke here for ?URI.  Saw Dr. Mariah Milling and Dr. Reece Agar recently. Note reviewed-  Felt shortness of breath is likely secondary to obesity, continued smoking.  Unable to exclude mild pulmonary hypertension/diastolic dysfunction from her obesity. Given her Lasix to take when necessary for worsening edema or shortness of breath but she has been afraid to take it due to side effects.  Past week, cough has worsened.  Now febrile, coughs so hard she often vomits. No diarrhea, no abdominal pain.      Outpatient Encounter Prescriptions as of 09/13/2011  Medication Sig Dispense Refill  . aspirin 81 MG EC tablet Take 81 mg by mouth daily.        Marland Kitchen atorvastatin (LIPITOR) 40 MG tablet Take 1 tablet (40 mg total) by mouth daily.  90 tablet  1  . cyclobenzaprine (FLEXERIL) 10 MG tablet Take 1 tablet (10 mg total) by mouth 2 (two) times daily as needed (migraines).  60 tablet  3  . diltiazem (CARDIZEM CD) 180 MG 24 hr capsule Take 1 capsule (180 mg total) by mouth daily.  90 capsule  3  . FLUoxetine (PROZAC) 40 MG capsule Take 1 capsule (40 mg total) by mouth daily.  90 capsule  1  . omeprazole (PRILOSEC) 20 MG capsule Take 1 capsule (20 mg total) by mouth 2 (two) times daily.  60 capsule  6  . propranolol (INDERAL) 20 MG tablet Take 2 tablets (40 mg total) by mouth 3 (three) times daily.  180 tablet  6  . furosemide (LASIX) 20 MG tablet Take 1 tablet (20 mg total) by mouth daily as needed.  30 tablet  6    Review of Systems  See HPI  Physical Exam  BP 120/80  Pulse 84  Temp(Src) 99.5 F (37.5 C) (Oral)  Wt 300 lb (136.079 kg) Nursing note and vitals reviewed. Constitutional: She is oriented to person, place, and time. She appears well-developed and well-nourished.   Morbidly obese  HENT:  Head: Normocephalic.  Nose: Nose normal.  Mouth/Throat: Oropharynx is clear and moist.  Eyes: Conjunctivae are normal. Pupils are equal, round, and reactive to light.  Neck: Normal range of motion. Neck supple. No JVD present.  Cardiovascular: Normal rate, regular rhythm, S1 normal, S2 normal, normal heart sounds and intact distal pulses.  Exam reveals no gallop and no friction rub.   No murmur heard. Pulmonary/Chest:pos exp wheezes, no crackles, wet cough with no increased WOB. Abdominal: Soft. Bowel sounds are normal. She exhibits no distension. There is no tenderness.  Musculoskeletal: Normal range of motion. She exhibits no edema and no tenderness.  Lymphadenopathy:    She has no cervical adenopathy.  Neurological: She is alert and oriented to person, place, and time. Coordination normal.  Skin: Skin is warm and dry. No rash noted. No erythema.  Psychiatric: She has a normal mood and affect. Her behavior is normal. Judgment and thought content normal.         Assessment and Plan   1. URI (upper respiratory infection)   Does have wheezes on exam today, no worsening edema based on office notes and pt report. Will place on Zpack. Given rx for albuterol inhaler but advised to use only in exteme circumstances  she does have a h/o palpitations and beta blocker use. The patient indicates understanding of these issues and agrees with the plan.

## 2011-09-25 ENCOUNTER — Encounter: Payer: Self-pay | Admitting: Family Medicine

## 2011-09-25 ENCOUNTER — Ambulatory Visit (INDEPENDENT_AMBULATORY_CARE_PROVIDER_SITE_OTHER): Payer: 59 | Admitting: Family Medicine

## 2011-09-25 VITALS — BP 146/100 | HR 88 | Temp 98.9°F

## 2011-09-25 DIAGNOSIS — J45909 Unspecified asthma, uncomplicated: Secondary | ICD-10-CM

## 2011-09-25 MED ORDER — PREDNISONE 20 MG PO TABS: ORAL_TABLET | ORAL | Status: DC

## 2011-09-25 NOTE — Progress Notes (Signed)
  Subjective:    Patient ID: Kathleen Good, female    DOB: 1961/01/16, 51 y.o.   MRN: [id number]  HPI CC: bronchitis f/u  Seen here by Dr. Mervyn Skeeters on Wednesday 09/19/2011 with dx bronchitis, note reviewed.  Treated with zpack.  Placed on albuterol but only advised to use if very wheezy or SOB.  Also placed on tessalon perls.  Has been using albuterol some, sparingly.  Causes instant headache.  Tessalon perls helping some.  Cough still present.  Coughing fits to point of passing out.  This causes BP to shoot up.  Fatigue and no energy.  Wheezing improved.  Hoarse voice started over weekend, present for last 3 days.  Low grade temperature in evening.  Cough somewhat productive of whitish sputum.  Feeling pressure left side of head.  Mild coryza, RN, ++ PNdrainage.  No fevers/chills, abd pain, nausea, ST, ear pain or tooth pain.  No change in headaches.  No sick contacts at home.  Decreased smoking 2/2 coughing and feeling ill.  No h/o asthma.  Lab Results  Component Value Date   HGBA1C 6.8* 09/07/2011   Wt Readings from Last 3 Encounters:  09/19/11 300 lb (136.079 kg)  09/13/11 299 lb 12.8 oz (135.988 kg)  09/07/11 299 lb 4 oz (135.739 kg)    Review of Systems Per HPI    Objective:   Physical Exam  Nursing note and vitals reviewed. Constitutional: She appears well-developed and well-nourished. No distress.  HENT:  Head: Normocephalic and atraumatic.  Right Ear: Hearing, tympanic membrane, external ear and ear canal normal.  Left Ear: Hearing, tympanic membrane, external ear and ear canal normal.  Nose: Mucosal edema present. No rhinorrhea. Right sinus exhibits no maxillary sinus tenderness and no frontal sinus tenderness. Left sinus exhibits no maxillary sinus tenderness and no frontal sinus tenderness.  Mouth/Throat: Uvula is midline and mucous membranes are normal. Posterior oropharyngeal edema present. No oropharyngeal exudate, posterior oropharyngeal erythema or tonsillar abscesses.      Posterior pharyngeal drainage  Eyes: Conjunctivae and EOM are normal. Pupils are equal, round, and reactive to light. No scleral icterus.  Neck: Normal range of motion. Neck supple.  Cardiovascular: Normal rate, regular rhythm, normal heart sounds and intact distal pulses.   No murmur heard. Pulmonary/Chest: Effort normal. No respiratory distress. She has wheezes. She has no rales.       End exp wheezing, prolonged expiratory phase  Lymphadenopathy:    She has no cervical adenopathy.  Skin: Skin is warm and dry. No rash noted.  Psychiatric: She has a normal mood and affect.       Assessment & Plan:

## 2011-09-25 NOTE — Assessment & Plan Note (Addendum)
Anticipate asthmatic bronchitis. S/p treatment with zpack.  Continued wheezing despite albuterol and SOB and cough. Treat with steroid course - discussed steroid precautions as well as need to monitor sugars closely. I don't think current further infection so will hold off on abx for now, if worsening pt to update [place].

## 2011-09-25 NOTE — Patient Instructions (Signed)
I think you have asthmatic bronchitis - treat with course of steroids. Continue tessalon perls, continue mucinex. Push fluids and plenty of rest. If fever >101.5, or worsening productive cough or shortness of breath, let us know.

## 2011-09-26 ENCOUNTER — Encounter: Payer: Self-pay | Admitting: Family Medicine

## 2011-10-18 ENCOUNTER — Encounter: Payer: Self-pay | Admitting: Internal Medicine

## 2011-11-20 ENCOUNTER — Encounter: Payer: Self-pay | Admitting: Internal Medicine

## 2011-11-20 ENCOUNTER — Ambulatory Visit (AMBULATORY_SURGERY_CENTER): Payer: 59

## 2011-11-20 VITALS — Ht 65.0 in | Wt 303.5 lb

## 2011-11-20 DIAGNOSIS — Z86010 Personal history of colonic polyps: Secondary | ICD-10-CM

## 2011-11-20 DIAGNOSIS — Z1211 Encounter for screening for malignant neoplasm of colon: Secondary | ICD-10-CM

## 2011-11-20 DIAGNOSIS — Z800 Family history of malignant neoplasm of digestive organs: Secondary | ICD-10-CM

## 2011-11-20 MED ORDER — PEG-KCL-NACL-NASULF-NA ASC-C 100 G PO SOLR: 1.0000 | Freq: Once | ORAL | Status: AC

## 2011-11-22 ENCOUNTER — Telehealth: Payer: Self-pay | Admitting: Internal Medicine

## 2011-11-22 NOTE — Telephone Encounter (Signed)
Pt is unable to afford prep and says she will have to cancel procedure.  Offered pt voucher for free MoviPrep.   Voucher mailed to pt.

## 2011-11-23 ENCOUNTER — Telehealth: Payer: Self-pay

## 2011-11-23 ENCOUNTER — Encounter: Payer: Self-pay | Admitting: Internal Medicine

## 2011-11-23 NOTE — Telephone Encounter (Signed)
Patient advised that her procedure should be rescheduled to the hospital due to her other health conditions.  I have contacted the endo unit at [facility] and they are closed for the afternoon.  The patient is advised I will call her Monday with an appt date and time.  She is agreeable to reschedule to the hospital.

## 2011-11-23 NOTE — Telephone Encounter (Signed)
I have left a message for the patient to call back to discuss 

## 2011-11-23 NOTE — Telephone Encounter (Signed)
Message copied by Annett Fabian on Fri Nov 23, 2011  3:09 PM ------      Message from: Iva Boop      Created: Fri Nov 23, 2011  1:23 PM      Regarding: procedure location chamge       Please call the patient and explain that due to her health conditions it is safer to have the colonoscopy at the hospital.            Please arrange hospital colonoscopy (can ask her if she thinks she needs propofol based upon her prior procedure experience with moderate sedation)            Preferably during my hospital week but if she cannot make the change i will try my best to accommodate her schedule

## 2011-11-24 DIAGNOSIS — K635 Polyp of colon: Secondary | ICD-10-CM

## 2011-11-24 HISTORY — DX: Polyp of colon: K63.5

## 2011-11-26 NOTE — Telephone Encounter (Signed)
Patient is scheduled for [facility] for 12/11/11 11:00.  She did remember not being well sedated with then last procedure and I have scheduled with propofol.  I have left her a message to call back to further discuss

## 2011-11-27 ENCOUNTER — Other Ambulatory Visit: Payer: Self-pay

## 2011-11-27 NOTE — Telephone Encounter (Signed)
Patient advised of the appt and the instruction changes for colon at [facility].  She is aware to be NPO after 7:00 am and to register in outpatient registration.  She will call back for any questions

## 2011-12-04 ENCOUNTER — Encounter: Payer: 59 | Admitting: Internal Medicine

## 2011-12-10 ENCOUNTER — Telehealth: Payer: Self-pay | Admitting: Gastroenterology

## 2011-12-10 NOTE — Telephone Encounter (Signed)
On call note at 1730. Pt started MoviPrep for colonoscopy tomorrow. She had gagging and vomiting halfway through the first glass and feels she cannot take this prep. She states that she tolerated Miralax/Gatorade previously and so I gave her verbal instructions on taking that prep tonight in place of MoviPrep.

## 2011-12-11 ENCOUNTER — Encounter (HOSPITAL_COMMUNITY)
Admission: RE | Disposition: A | Discharge status: Home / Self Care | Payer: Self-pay | Source: Ambulatory Visit | Attending: Internal Medicine

## 2011-12-11 ENCOUNTER — Encounter (HOSPITAL_COMMUNITY): Payer: Self-pay | Admitting: *Deleted

## 2011-12-11 ENCOUNTER — Encounter (HOSPITAL_COMMUNITY): Payer: Self-pay | Admitting: Internal Medicine

## 2011-12-11 ENCOUNTER — Ambulatory Visit (HOSPITAL_COMMUNITY)
Admission: RE | Admit: 2011-12-11 | Discharge: 2011-12-11 | Disposition: A | Discharge status: Home / Self Care | Payer: 59 | Source: Ambulatory Visit | Attending: Internal Medicine | Admitting: Internal Medicine

## 2011-12-11 ENCOUNTER — Ambulatory Visit (HOSPITAL_COMMUNITY): Payer: 59 | Admitting: *Deleted

## 2011-12-11 DIAGNOSIS — D126 Benign neoplasm of colon, unspecified: Secondary | ICD-10-CM | POA: Diagnosis present

## 2011-12-11 DIAGNOSIS — Z800 Family history of malignant neoplasm of digestive organs: Secondary | ICD-10-CM

## 2011-12-11 DIAGNOSIS — Z1211 Encounter for screening for malignant neoplasm of colon: Secondary | ICD-10-CM

## 2011-12-11 HISTORY — PX: COLONOSCOPY: SHX5424

## 2011-12-11 LAB — GLUCOSE, CAPILLARY: Glucose-Capillary: 106 mg/dL — ABNORMAL HIGH (ref 70–99)

## 2011-12-11 SURGERY — COLONOSCOPY
Anesthesia: Monitor Anesthesia Care

## 2011-12-11 MED ORDER — MIDAZOLAM HCL 5 MG/5ML IJ SOLN
INTRAMUSCULAR | Status: DC | PRN
  Administered 2011-12-11: 2 mg via INTRAVENOUS

## 2011-12-11 MED ORDER — LABETALOL HCL 5 MG/ML IV SOLN
INTRAVENOUS | Status: DC | PRN
  Administered 2011-12-11: 5 mg via INTRAVENOUS

## 2011-12-11 MED ORDER — KETAMINE HCL 10 MG/ML IJ SOLN
INTRAMUSCULAR | Status: DC | PRN
  Administered 2011-12-11: 35 mg via INTRAVENOUS

## 2011-12-11 MED ORDER — LACTATED RINGERS IV SOLN
INTRAVENOUS | Status: DC | PRN
  Administered 2011-12-11: 11:00:00 via INTRAVENOUS

## 2011-12-11 MED ORDER — FENTANYL CITRATE 0.05 MG/ML IJ SOLN
INTRAMUSCULAR | Status: DC | PRN
  Administered 2011-12-11: 100 ug via INTRAVENOUS

## 2011-12-11 MED ORDER — PROPOFOL 10 MG/ML IV EMUL
INTRAVENOUS | Status: DC | PRN
  Administered 2011-12-11: 120 ug/kg/min via INTRAVENOUS

## 2011-12-11 MED ORDER — LIDOCAINE HCL (CARDIAC) 20 MG/ML IV SOLN
INTRAVENOUS | Status: DC | PRN
  Administered 2011-12-11: 100 mg via INTRAVENOUS

## 2011-12-11 MED ORDER — ONDANSETRON HCL 4 MG/2ML IJ SOLN
INTRAMUSCULAR | Status: DC | PRN
  Administered 2011-12-11: 4 mg via INTRAVENOUS

## 2011-12-11 NOTE — Anesthesia Preprocedure Evaluation (Addendum)
Anesthesia Evaluation  Patient identified by MRN, date of birth, ID band Patient awake    Reviewed: Allergy & Precautions, H&P , NPO status , Patient's Chart, lab work & pertinent test results  Airway Mallampati: II TM Distance: >3 FB Neck ROM: Full    Dental   Loose right lower side/rear crown.:   Pulmonary shortness of breath, asthma , Current Smoker,  breath sounds clear to auscultation  Pulmonary exam normal       Cardiovascular hypertension, Pt. on home beta blockers and Pt. on medications + Valvular Problems/Murmurs Rhythm:Regular Rate:Normal     Neuro/Psych  Headaches, PSYCHIATRIC DISORDERS Depression    GI/Hepatic negative GI ROS, Neg liver ROS, GERD-  Medicated,Jaundiced.   Endo/Other  negative endocrine ROSDiabetes mellitus-, Type 2  Renal/GU negative Renal ROS  negative genitourinary   Musculoskeletal negative musculoskeletal ROS (+)   Abdominal (+) + obese,   Peds negative pediatric ROS (+)  Hematology negative hematology ROS (+)   Anesthesia Other Findings   Reproductive/Obstetrics negative OB ROS                           Anesthesia Physical Anesthesia Plan  ASA: III  Anesthesia Plan: MAC   Post-op Pain Management:    Induction: Intravenous  Airway Management Planned:   Additional Equipment:   Intra-op Plan:   Post-operative Plan:   Informed Consent: I have reviewed the patients History and Physical, chart, labs and discussed the procedure including the risks, benefits and alternatives for the proposed anesthesia with the patient or authorized representative who has indicated his/her understanding and acceptance.   Dental advisory given  Plan Discussed with: CRNA  Anesthesia Plan Comments:         Anesthesia Quick Evaluation

## 2011-12-11 NOTE — Anesthesia Postprocedure Evaluation (Signed)
.   Anesthesia Post-op Note  Patient: Kathleen Good  Procedure(s) Performed: Procedure(s) (LRB): COLONOSCOPY (N/A)  Patient Location: PACU  Anesthesia Type: MAC  Level of Consciousness: awake and alert   Airway and Oxygen Therapy: Patient Spontanous Breathing  Post-op Pain: mild  Post-op Assessment: Post-op Vital signs reviewed, Patient's Cardiovascular Status Stable, Respiratory Function Stable, Patent Airway and No signs of Nausea or vomiting  Post-op Vital Signs: stable  Complications: No apparent anesthesia complications

## 2011-12-11 NOTE — Discharge Instructions (Addendum)
One tiny polyp was removed. Otherwise ok. Iva Boop, MD, [facility]  Colonoscopy Care After Read the instructions outlined below and refer to this sheet in the next few weeks. These discharge instructions provide you with general information on caring for yourself after you leave the hospital. Your doctor may also give you specific instructions. While your treatment has been planned according to the most current medical practices available, unavoidable complications occasionally occur. If you have any problems or questions after discharge, call your doctor. HOME CARE INSTRUCTIONS ACTIVITY:  You may resume your regular activity, but move at a slower pace for the next 24 hours.   Take frequent rest periods for the next 24 hours.   Walking will help get rid of the air and reduce the bloated feeling in your belly (abdomen).   No driving for 24 hours (because of the medicine (anesthesia) used during the test).   You may shower.   Do not sign any important legal documents or operate any machinery for 24 hours (because of the anesthesia used during the test).  NUTRITION:  Drink plenty of fluids.   You may resume your normal diet as instructed by your doctor.   Begin with a light meal and progress to your normal diet. Heavy or fried foods are harder to digest and may make you feel sick to your stomach (nauseated).   Avoid alcoholic beverages for 24 hours or as instructed.  MEDICATIONS:  You may resume your normal medications unless your doctor tells you otherwise.  WHAT TO EXPECT TODAY:  Some feelings of bloating in the abdomen.   Passage of more gas than usual.   Spotting of blood in your stool or on the toilet paper.  IF YOU HAD POLYPS REMOVED DURING THE COLONOSCOPY:  No aspirin products for 7 days or as instructed.   No alcohol for 7 days or as instructed.   Eat a soft diet for the next 24 hours.  FINDING OUT THE RESULTS OF YOUR TEST Not all test results are available during  your visit. If your test results are not back during the visit, make an appointment with your caregiver to find out the results. Do not assume everything is normal if you have not heard from your caregiver or the medical facility. It is important for you to follow up on all of your test results.  SEEK IMMEDIATE MEDICAL CARE IF:  You have more than a spotting of blood in your stool.   Your belly is swollen (abdominal distention).   You are nauseated or vomiting.   You have a fever.   You have abdominal pain or discomfort that is severe or gets worse throughout the day.  Document Released: 01/24/2004 Document Revised: 05/31/2011 Document Reviewed: 01/22/2008 [facility] Patient Information 2012 [place], [place].  Moderate Sedation, Adult Moderate sedation is given to help you relax or even sleep through a procedure. You may remain sleepy, be clumsy, or have poor balance for several hours following this procedure. Arrange for a responsible adult, family member, or friend to take you home. A responsible adult should stay with you for at least 24 hours or until the medicines have worn off.  Do not participate in any activities where you could become injured for the next 24 hours, or until you feel normal again. Do not:   Drive.   Swim.   Ride a bicycle.   Operate heavy machinery.   Cook.   Use power tools.   Climb ladders.   Work at  heights.   Do not make important decisions or sign legal documents until you are improved.   Vomiting may occur if you eat too soon. When you can drink without vomiting, try water, juice, or soup. Try solid foods if you feel little or no nausea.   Only take over-the-counter or prescription medications for pain, discomfort, or fever as directed by your caregiver.If pain medications have been prescribed for you, ask your caregiver how soon it is safe to take them.   Make sure you and your family fully understands everything about the medication given to  you. Make sure you understand what side effects may occur.   You should not drink alcohol, take sleeping pills, or medications that cause drowsiness for at least 24 hours.   If you smoke, do not smoke alone.   If you are feeling better, you may resume normal activities 24 hours after receiving sedation.   Keep all appointments as scheduled. Follow all instructions.   Ask questions if you do not understand.  SEEK MEDICAL CARE IF:   Your skin is pale or bluish in color.   You continue to feel sick to your stomach (nauseous) or throw up (vomit).   Your pain is getting worse and not helped by medication.   You have bleeding or swelling.   You are still sleepy or feeling clumsy after 24 hours.  SEEK IMMEDIATE MEDICAL CARE IF:   You develop a rash.   You have difficulty breathing.   You develop any type of allergic problem.   You have a fever.  Document Released: 03/06/2001 Document Revised: 05/31/2011 Document Reviewed: 07/28/2007 [facility] Patient Information 2012 [place], [place].

## 2011-12-11 NOTE — Preoperative (Signed)
Beta Blockers   Reason not to administer Beta Blockers:Not Applicable Beta Blocker taken 12-10-11 PM

## 2011-12-11 NOTE — Transfer of Care (Signed)
Immediate Anesthesia Transfer of Care Note  Patient: Kathleen Good  Procedure(s) Performed: Procedure(s) (LRB): COLONOSCOPY (N/A)  Patient Location: PACU  Anesthesia Type: MAC  Level of Consciousness: sedated, patient cooperative and responds to stimulaton  Airway & Oxygen Therapy: Patient Spontanous Breathing and Patient connected to face mask oxgen  Post-op Assessment: Report given to PACU RN and Post -op Vital signs reviewed and stable  Post vital signs: Reviewed and stable  Complications: No apparent anesthesia complications

## 2011-12-11 NOTE — Op Note (Signed)
[facility] [address] [place], [place]  [address]  COLONOSCOPY PROCEDURE REPORT  PATIENT:  Kathleen Good, Kathleen Good  MR#:  [id number] BIRTHDATE:  [birth year], 50 yrs. old  GENDER:  female ENDOSCOPIST:  Iva Boop, MD, [facility] REF. BY: PROCEDURE DATE:  12/11/2011 PROCEDURE:  Colonoscopy with biopsy ASA CLASS:  Class III INDICATIONS:  Elevated Risk Screening, family history of colon cancer Father at 80 MEDICATIONS:   MAC sedation, administered by CRNA  DESCRIPTION OF PROCEDURE:   After the risks benefits and alternatives of the procedure were thoroughly explained, informed consent was obtained.  Digital rectal exam was performed and revealed no abnormalities.   The EC-[id number] ([id number]) endoscope was introduced through the anus and advanced to the cecum, which was identified by both the appendix and ileocecal valve, without limitations.  The quality of the prep was excellent, using MoviPrep.  The instrument was then slowly withdrawn as the colon was fully examined. <<PROCEDUREIMAGES>>  FINDINGS:  A diminutive polyp was found in the descending colon. The polyp was removed using cold biopsy forceps.  This was otherwise a normal examination of the colon.   Retroflexed views in the rectum revealed no abnormalities.    The time to cecum = 8 minutes. The scope was then withdrawn in 9 minutes from the cecum and the procedure completed. COMPLICATIONS:  None ENDOSCOPIC IMPRESSION: 1) Diminutive polyp in the descending colon - removed 2) Otherwise normal examination  REPEAT EXAM:  In 5 year(s) for Colonoscopy.  Iva Boop, MD, [facility]  CC:  The Patient and Eustaquio Boyden MD  n. Rosalie Doctor:   Iva Boop at 12/11/2011 11:53 AM  Tomasita Crumble, [phone]
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
## 2011-12-12 ENCOUNTER — Encounter (HOSPITAL_COMMUNITY): Payer: Self-pay | Admitting: Internal Medicine

## 2011-12-12 ENCOUNTER — Encounter: Payer: Self-pay | Admitting: Internal Medicine

## 2011-12-12 NOTE — Progress Notes (Signed)
Quick Note:  Hyperplastic polyp Routine repeat colonoscopy 5 years about 11/2016 - Fhx colorectal cancer ______

## 2011-12-26 ENCOUNTER — Telehealth: Payer: Self-pay | Admitting: Family Medicine

## 2011-12-26 NOTE — Telephone Encounter (Signed)
Left message on voice mail  to call back

## 2011-12-26 NOTE — Telephone Encounter (Signed)
Caller: Kathleen Good/Patient; PCP: Eustaquio Boyden; CB#: [phone]; Call regarding Headache; Onset "almost 2 weeks".  Relates history of Migraines.  Has taken Flexeril and Tylenol for same without relief.  HA rated at 8 of 10, sometimes eases but does not completely resolve.  Interferes with sleep and eating.  Requested Dilaudid or Demerol Rx as these are the only meds that she knows that will help and she can tolerate; additionally requested steroids to ease sx.   Emergent sx ruled out; home care for the interim and parameters for callback given. .  Advised see provider in 24 hours per Headache protocol.   Declined appointment; states she has no transportation to office. Advised caller that it is unlikely for steroids to be called in without evaluation and that analgesics she requested require hard scripts.  She again declined appointment.  Info to office

## 2011-12-26 NOTE — Telephone Encounter (Signed)
I can't safely rx w/o seeing patient.  Needs evaluation.

## 2011-12-26 NOTE — Telephone Encounter (Signed)
Patient notified as instructed by telephone. Was advised by patient that she does not have transportation and is not able to drive. Advised patient again that she would need to be seen before medication can be prescribed. Was advised by patient that she hopes the headache will break soon. Advised patient that if this does not improve she should try to get transportation and seek treatment at an urgent care. Advised patient that if she does not get better to call the office Friday and we would be glad to see her if she can get transportation.

## 2011-12-27 NOTE — Telephone Encounter (Signed)
Noted  

## 2012-01-07 ENCOUNTER — Ambulatory Visit: Payer: 59 | Admitting: Family Medicine

## 2012-01-07 DIAGNOSIS — Z0289 Encounter for other administrative examinations: Secondary | ICD-10-CM

## 2012-03-25 DIAGNOSIS — Z87898 Personal history of other specified conditions: Secondary | ICD-10-CM

## 2012-03-25 HISTORY — DX: Personal history of other specified conditions: Z87.898

## 2012-04-04 ENCOUNTER — Encounter: Payer: Self-pay | Admitting: Family Medicine

## 2012-04-04 ENCOUNTER — Ambulatory Visit (INDEPENDENT_AMBULATORY_CARE_PROVIDER_SITE_OTHER): Payer: 59 | Admitting: Family Medicine

## 2012-04-04 VITALS — BP 126/82 | HR 84 | Temp 98.3°F | Wt 294.8 lb

## 2012-04-04 DIAGNOSIS — F17200 Nicotine dependence, unspecified, uncomplicated: Secondary | ICD-10-CM

## 2012-04-04 DIAGNOSIS — N644 Mastodynia: Secondary | ICD-10-CM

## 2012-04-04 MED ORDER — SULFAMETHOXAZOLE-TRIMETHOPRIM 800-160 MG PO TABS: 1.0000 | ORAL_TABLET | Freq: Two times a day (BID) | ORAL | Status: DC

## 2012-04-04 MED ORDER — NAPROXEN 500 MG PO TABS: ORAL_TABLET | ORAL | Status: DC

## 2012-04-04 NOTE — Assessment & Plan Note (Signed)
Mastitis with breast abscess vs inflammatory breast cancer. Start bactrim given h/o skin rash leading to excoriations, ?MRSA. Treat with naprosyn and warm compresses. Will also refer for diagnostic mammogram and ultrasound. Pt agrees with plan.

## 2012-04-04 NOTE — Patient Instructions (Signed)
Pass by Marion's office to schedule mammogram and ultrasound. Start bactrim twice daily for 10 days, use naprosyn as needed for breast pain. May continue warm compresses to breast. We will call you at [phone] with results.

## 2012-04-04 NOTE — Progress Notes (Signed)
  Subjective:    Patient ID: Kathleen Good, female    DOB: [birth year], 51 y.o.   MRN: [id number]  HPI CC: left breast pain  1 wk h/o pain in left breast.  Next day when awoke, noticed redness and swelling present.  Staying red and swollen at left lateral breast.  Noticing some streaking.  No fevers/chills, abd pain, n/v, rashes.  Never had anything like that.  Has tried ice/heat but hasn't really helped.    H/o diffuse cystic mastopathy.  Last mammo was several years ago.  Denies h/o abnormal mammograms.  Denies h/o breast infections in past.  Cousin with breast cancer. No recent hormonal meds. Current smoker - <1 ppd.  Due for DM f/u. Lab Results  Component Value Date   HGBA1C 6.8* 09/07/2011    Past Medical History  Diagnosis Date  . Depression   . GERD (gastroesophageal reflux disease)   . Hypertension   . Hyperglycemia   . Obesity   . Tobacco abuse   . Abdominal pain, unspecified site   . Chest pain, unspecified   . Diffuse cystic mastopathy   . Other specified disease of hair and hair follicles   . Calcaneal spur   . HLD (hyperlipidemia)   . Undiagnosed cardiac murmurs   . Personal history of colonic polyps   . Rash and other nonspecific skin eruption   . Tachycardia   . Persistent headaches     cervicogenic HA with migraine features, eval by Dr. Clarisse Gouge and others (5% permanent disability rating),  . DDD (degenerative disc disease), cervical     w/ occipital neuralgia, s/p bilateral upper cervical facet injections  . Colon polyp 11/2011    rpt due 5 yrs Leone Payor)     Review of Systems Per HPI    Objective:   Physical Exam  Nursing note and vitals reviewed. Constitutional: She appears well-developed and well-nourished. No distress.  Pulmonary/Chest: Right breast exhibits no inverted nipple, no mass, no nipple discharge, no skin change and no tenderness. Left breast exhibits mass (firm irregular mass at 3 o clock), skin change (overlying erythema) and  tenderness. Left breast exhibits no inverted nipple and no nipple discharge.    Lymphadenopathy:    She has no axillary adenopathy.       Right axillary: No lateral adenopathy present.       Left axillary: No lateral adenopathy present.      Right: No supraclavicular adenopathy present.       Left: No supraclavicular adenopathy present.  Skin: Skin is warm and dry. There is erythema.  Psychiatric: She has a normal mood and affect.       Assessment & Plan:

## 2012-04-04 NOTE — Assessment & Plan Note (Signed)
Continue to encourage cessation. 

## 2012-04-08 ENCOUNTER — Ambulatory Visit
Admission: RE | Admit: 2012-04-08 | Discharge: 2012-04-08 | Disposition: A | Discharge status: Home / Self Care | Payer: 59 | Source: Ambulatory Visit | Attending: Family Medicine | Admitting: Family Medicine

## 2012-04-08 ENCOUNTER — Other Ambulatory Visit: Payer: Self-pay | Admitting: Family Medicine

## 2012-04-08 DIAGNOSIS — N644 Mastodynia: Secondary | ICD-10-CM

## 2012-05-13 ENCOUNTER — Ambulatory Visit
Admission: RE | Admit: 2012-05-13 | Discharge: 2012-05-13 | Disposition: A | Discharge status: Home / Self Care | Payer: 59 | Source: Ambulatory Visit | Attending: Family Medicine | Admitting: Family Medicine

## 2012-05-13 DIAGNOSIS — N644 Mastodynia: Secondary | ICD-10-CM

## 2012-06-27 ENCOUNTER — Encounter: Payer: Self-pay | Admitting: Family Medicine

## 2012-06-27 ENCOUNTER — Ambulatory Visit (INDEPENDENT_AMBULATORY_CARE_PROVIDER_SITE_OTHER): Payer: 59 | Admitting: Family Medicine

## 2012-06-27 VITALS — BP 138/82 | HR 72 | Temp 98.1°F | Wt 299.0 lb

## 2012-06-27 DIAGNOSIS — M545 Low back pain, unspecified: Secondary | ICD-10-CM

## 2012-06-27 DIAGNOSIS — G8929 Other chronic pain: Secondary | ICD-10-CM | POA: Insufficient documentation

## 2012-06-27 DIAGNOSIS — M549 Dorsalgia, unspecified: Secondary | ICD-10-CM

## 2012-06-27 LAB — BASIC METABOLIC PANEL
BUN: 10 mg/dL (ref 6–23)
CO2: 26 mEq/L (ref 19–32)
Calcium: 9 mg/dL (ref 8.4–10.5)
Chloride: 102 mEq/L (ref 96–112)
Creatinine, Ser: 0.9 mg/dL (ref 0.4–1.2)
GFR: 74.39 mL/min (ref >60.00)
Glucose, Bld: 100 mg/dL — ABNORMAL HIGH (ref 70–99)
Potassium: 3.8 mEq/L (ref 3.5–5.1)
Sodium: 136 mEq/L (ref 135–145)

## 2012-06-27 LAB — POCT URINALYSIS DIPSTICK
Bilirubin, UA: NEGATIVE
Glucose, UA: NEGATIVE
Ketones, UA: NEGATIVE
Nitrite, UA: NEGATIVE
Spec Grav, UA: 1.025
Urobilinogen, UA: NEGATIVE
pH, UA: 6

## 2012-06-27 MED ORDER — TAMSULOSIN HCL 0.4 MG PO CAPS: 0.4000 mg | ORAL_CAPSULE | Freq: Every day | ORAL | Status: DC

## 2012-06-27 MED ORDER — NAPROXEN 500 MG PO TABS: ORAL_TABLET | ORAL | Status: DC

## 2012-06-27 MED ORDER — TRAMADOL HCL 50 MG PO TABS: 50.0000 mg | ORAL_TABLET | Freq: Two times a day (BID) | ORAL | Status: DC | PRN

## 2012-06-27 MED ORDER — TRIAMCINOLONE ACETONIDE 0.5 % EX OINT: 1.0000 "application " | TOPICAL_OINTMENT | Freq: Two times a day (BID) | CUTANEOUS | Status: DC

## 2012-06-27 NOTE — Assessment & Plan Note (Signed)
At R flank, present for last few weeks. UA/micro negative for infection today. + trace blood, rare RBC on micro, but did see several CaOx crystals. Anticipate pain due to kidney stone in fmhx of same. Will treat with naprosyn, flomax, and vicodin for breakthrough pain.  rec push fluids and start straining urine. Given duration of pain, will check Cr to ensure kidney function stable. If persistent pain consider CT abd/pelvis stone protocol.

## 2012-06-27 NOTE — Patient Instructions (Signed)
I think you do have a kidney stone causing this pain. Use naprosyn for pain up to twice daily with food. Use tramadol for breaktrhough pain. Use flomax for next week to help pass stone. Strain urine. Push lots of fluid. Please let me know if not improving as expected over next week or if any worsening for imaging study.  Good to see you, call us wihtq uestions.
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
## 2012-06-29 LAB — URINE CULTURE: Colony Count: 5000

## 2012-07-21 ENCOUNTER — Ambulatory Visit (INDEPENDENT_AMBULATORY_CARE_PROVIDER_SITE_OTHER)
Admission: RE | Admit: 2012-07-21 | Discharge: 2012-07-21 | Disposition: A | Discharge status: Home / Self Care | Payer: 59 | Source: Ambulatory Visit | Attending: Family Medicine | Admitting: Family Medicine

## 2012-07-21 ENCOUNTER — Telehealth: Payer: Self-pay

## 2012-07-21 DIAGNOSIS — M545 Low back pain, unspecified: Secondary | ICD-10-CM

## 2012-07-21 NOTE — Telephone Encounter (Signed)
Is pain still present - if so, will order CT scan. Lab Results  Component Value Date   CREATININE 0.9 06/27/2012

## 2012-07-21 NOTE — Telephone Encounter (Signed)
Discussed results with pt- unrevealing CT scan.  ?sacroiliac arthritis and DDD.

## 2012-07-21 NOTE — Telephone Encounter (Signed)
Kathleen Good with [facility] called report for pt CT abdomen & pelvis which is already under imaging tab in pt's chart. Dr Reece Agar said pt could go home and our office will call pt later.Kathleen Good will notify pt.

## 2012-07-21 NOTE — Telephone Encounter (Signed)
Pt left v/m was seem 06/27/12; pt has not passed kidney stone and pt wants to know if Dr Reece Agar wants to see pt again or will he schedule [place] of kidneys.Please advise.

## 2012-07-21 NOTE — Telephone Encounter (Signed)
Spoke with patient and she still has pain. Wants CT. Already scheduled with Shirlee Limerick. Will await call with results.

## 2012-08-09 ENCOUNTER — Other Ambulatory Visit: Payer: Self-pay

## 2012-08-18 ENCOUNTER — Other Ambulatory Visit: Payer: Self-pay | Admitting: Family Medicine

## 2012-09-05 ENCOUNTER — Encounter: Payer: Self-pay | Admitting: Family Medicine

## 2012-09-05 ENCOUNTER — Ambulatory Visit (INDEPENDENT_AMBULATORY_CARE_PROVIDER_SITE_OTHER): Payer: 59 | Admitting: Family Medicine

## 2012-09-05 VITALS — BP 126/84 | HR 68 | Temp 98.3°F

## 2012-09-05 DIAGNOSIS — G479 Sleep disorder, unspecified: Secondary | ICD-10-CM

## 2012-09-05 DIAGNOSIS — R5381 Other malaise: Secondary | ICD-10-CM

## 2012-09-05 DIAGNOSIS — M2550 Pain in unspecified joint: Secondary | ICD-10-CM

## 2012-09-05 LAB — CBC WITH DIFFERENTIAL/PLATELET
Basophils Absolute: 0.1 10*3/uL (ref 0.0–0.1)
Basophils Relative: 0.8 % (ref 0.0–3.0)
Eosinophils Absolute: 0.2 10*3/uL (ref 0.0–0.7)
Eosinophils Relative: 1.7 % (ref 0.0–5.0)
HCT: 43.7 % (ref 36.0–46.0)
Hemoglobin: 14.6 g/dL (ref 12.0–15.0)
Lymphocytes Relative: 24.4 % (ref 12.0–46.0)
Lymphs Abs: 2.4 10*3/uL (ref 0.7–4.0)
MCHC: 33.4 g/dL (ref 30.0–36.0)
MCV: 93.2 fl (ref 78.0–100.0)
Monocytes Absolute: 0.7 10*3/uL (ref 0.1–1.0)
Monocytes Relative: 7.6 % (ref 3.0–12.0)
Neutro Abs: 6.4 10*3/uL (ref 1.4–7.7)
Neutrophils Relative %: 65.5 % (ref 43.0–77.0)
Platelets: 312 10*3/uL (ref 150.0–400.0)
RBC: 4.69 Mil/uL (ref 3.87–5.11)
RDW: 15.3 % — ABNORMAL HIGH (ref 11.5–14.6)
WBC: 9.7 10*3/uL (ref 4.5–10.5)

## 2012-09-05 LAB — TSH: TSH: 1.49 u[IU]/mL (ref 0.35–5.50)

## 2012-09-05 LAB — VITAMIN B12: Vitamin B-12: 142 pg/mL — ABNORMAL LOW (ref 211–911)

## 2012-09-05 LAB — HIGH SENSITIVITY CRP: CRP, High Sensitivity: 2.96 mg/L (ref 0.000–5.000)

## 2012-09-05 LAB — SEDIMENTATION RATE: Sed Rate: 37 mm/hr — ABNORMAL HIGH (ref 0–22)

## 2012-09-05 NOTE — Progress Notes (Signed)
Subjective:    Patient ID: Kathleen Good, female    DOB: 07-Jul-1960, 52 y.o.   MRN: [id number]  HPI CC: ?arthritis  Worried because of amount of aches and pains that she has.  Has questions about fibromyalgia and lupus because sister has lupus Biggest complaint is no fatigue/no energy - even reading is too taxing at times. Intermittent foggy head, trouble focusing, sometimes mixes words and has trouble finishing sentences.  Trouble sleeping and not getting restful sleep.  Daytime somnolence present.  Snores.  Denies apnea, orthopnea, PNdyspnea. Frustrated because has bee unable to do regular things (like go to son's birthday party) 2/2 fatigue and weakness. Mood swings.  Depressed because of above, but doesn't feel is overwhelmingly depressed.  No SI/HI. Decreased appetite.  Joint pain - lower back, mid back, upper back and neck, ankles, knees, hips, shoulders, wrists, fingers - DIPs, spares MCPs and PIPs.  Spares elbows.  Denies redness, swelling or redness of joints. Lower back pain - worse in am and after walking more than a few minutes.  Sometimes radiates down left hip and posterior leg.  Leaning forward ie on shopping cart relieves pain. Leg aches - feels weak after walking a few minutes "feel like jello". Neck and upper mid back pain, worse when working with arms above heart level.  Some arm weakness when gets pain. Intermittent tingling, numbness, cold in fingers.  Fingers turn cold intermittently, never pain. Constant low grade headache, migraines 3-4 x/wk. Known cervical DDD with cervicogenic HA and occipital neuralgia in past.  Dry mouth - lips cracking at angles. Perimenopausal. LMP - 08/16/2012  lipitor - has been on statin for several years. prozac - doesn't really help these sxs.  Flexeril helps neck tightness and migraines. Tramadol doesn't really help.   Wt Readings from Last 3 Encounters:  06/27/12 299 lb (135.626 kg)  04/04/12 294 lb 12 oz (133.698 kg)   11/20/11 303 lb 8 oz (137.667 kg)    Past Medical History  Diagnosis Date  . Depression   . GERD (gastroesophageal reflux disease)   . Hypertension   . Hyperglycemia   . Obesity   . Tobacco abuse   . Abdominal pain, unspecified site   . Chest pain, unspecified   . Diffuse cystic mastopathy   . Other specified disease of hair and hair follicles   . Calcaneal spur   . HLD (hyperlipidemia)   . Undiagnosed cardiac murmurs   . Personal history of colonic polyps   . Rash and other nonspecific skin eruption   . Tachycardia   . Persistent headaches     cervicogenic HA with migraine features, eval by Dr. Clarisse Gouge and others (5% permanent disability rating),  . DDD (degenerative disc disease), cervical     w/ occipital neuralgia, s/p bilateral upper cervical facet injections  . Colon polyp 11/2011    rpt due 5 yrs Leone Payor)    Family History  Problem Relation Age of Onset  . Hypertension Mother   . Coronary artery disease Father 15    h/o CABG  . Hypertension Father   . Colon cancer Father 27  . Heart disease Father     Review of Systems per HPI    Objective:   Physical Exam  Nursing note and vitals reviewed. Constitutional: She appears well-developed and well-nourished. No distress.  HENT:  Head: Normocephalic and atraumatic.  Mouth/Throat: Oropharynx is clear and moist. No oropharyngeal exudate.  mallampati class 2  Eyes: Conjunctivae and EOM are normal. Pupils are  equal, round, and reactive to light. No scleral icterus.  Neck: Normal range of motion. Neck supple. No thyromegaly present.  Cardiovascular: Normal rate, regular rhythm, normal heart sounds and intact distal pulses.   No murmur heard. Pulmonary/Chest: Effort normal and breath sounds normal. No respiratory distress. She has no wheezes. She has no rales.  Musculoskeletal: She exhibits no edema.  No active synovitis. No joint inflammation, swelling, warmth or redness appreciated  14/18 FM tender points tender  - not tender at origin of supraspinatus mm or under SCM mm  Lymphadenopathy:    She has no cervical adenopathy.  Neurological:  5/5 strength BUE and BLE, both proximal and distal muscle groups  Skin: Skin is warm and dry.  Psychiatric: She has a normal mood and affect.       Assessment & Plan:

## 2012-09-05 NOTE — Patient Instructions (Addendum)
Sounds like this could be fibromyalgia - look at info below. Blood work today - we will call you with results.. We will call you with referral to lung doctor to evaluate for sleep apnea.  Fibromyalgia Fibromyalgia is a disorder that is often misunderstood. It is associated with muscular pains and tenderness that comes and goes. It is often associated with fatigue and sleep disturbances. Though it tends to be long-lasting, fibromyalgia is not life-threatening. CAUSES  The exact cause of fibromyalgia is unknown. People with certain gene types are predisposed to developing fibromyalgia and other conditions. Certain factors can play a role as triggers, such as:  Spine disorders.  Arthritis.  Severe injury (trauma) and other physical stressors.  Emotional stressors. SYMPTOMS   The main symptom is pain and stiffness in the muscles and joints, which can vary over time.  Sleep and fatigue problems. Other related symptoms may include:  Bowel and bladder problems.  Headaches.  Visual problems.  Problems with odors and noises.  Depression or mood changes.  Painful periods (dysmenorrhea).  Dryness of the skin or eyes. DIAGNOSIS  There are no specific tests for diagnosing fibromyalgia. Patients can be diagnosed accurately from the specific symptoms they have. The diagnosis is made by determining that nothing else is causing the problems. TREATMENT  There is no cure. Management includes medicines and an active, healthy lifestyle. The goal is to enhance physical fitness, decrease pain, and improve sleep. HOME CARE INSTRUCTIONS   Only take over-the-counter or prescription medicines as directed by your caregiver. Sleeping pills, tranquilizers, and pain medicines may make your problems worse.  Low-impact aerobic exercise is very important and advised for treatment. At first, it may seem to make pain worse. Gradually increasing your tolerance will overcome this feeling.  Learning  relaxation techniques and how to control stress will help you. Biofeedback, visual imagery, hypnosis, muscle relaxation, yoga, and meditation are all options.  Anti-inflammatory medicines and physical therapy may provide short-term help.  Acupuncture or massage treatments may help.  Take muscle relaxant medicines as suggested by your caregiver.  Avoid stressful situations.  Plan a healthy lifestyle. This includes your diet, sleep, rest, exercise, and friends.  Find and practice a hobby you enjoy.  Join a fibromyalgia support group for interaction, ideas, and sharing advice. This may be helpful. SEEK MEDICAL CARE IF:  You are not having good results or improvement from your treatment. FOR MORE INFORMATION  National Fibromyalgia Association: www.fmaware.org Arthritis Foundation: www.arthritis.org Document Released: 06/11/2005 Document Revised: 09/03/2011 Document Reviewed: 09/21/2009 [facility] Patient Information 2013 [place], [place].

## 2012-09-06 LAB — VITAMIN D 25 HYDROXY (VIT D DEFICIENCY, FRACTURES): Vit D, 25-Hydroxy: 13 ng/mL — ABNORMAL LOW (ref 30–89)

## 2012-09-07 ENCOUNTER — Encounter: Payer: Self-pay | Admitting: Family Medicine

## 2012-09-07 DIAGNOSIS — M797 Fibromyalgia: Secondary | ICD-10-CM

## 2012-09-07 DIAGNOSIS — G4733 Obstructive sleep apnea (adult) (pediatric): Secondary | ICD-10-CM | POA: Insufficient documentation

## 2012-09-07 HISTORY — DX: Fibromyalgia: M79.7

## 2012-09-07 NOTE — Assessment & Plan Note (Signed)
In this obese patient with h/o HTN, daytime somnolence and ESS score of 11. Reasonable to refer for evaluation and possible sleep study. Pt agrees with plan - aware of OSA as husband uses CPAP nightly.

## 2012-09-07 NOTE — Assessment & Plan Note (Signed)
Check basic blood work to eval for fatigue (did have normal BMP 2 mo ago). Check vitamin B12 and D.

## 2012-09-07 NOTE — Assessment & Plan Note (Signed)
Diffuse throughout body - entire back, ankles, knees and hips, shoulders, wrists and DIPs.   Also endorses myalgias of upper back and arms, forearms, and thighs on pain diagram provided today. Also with longstanding h/o headaches, currently with significant fatigue and cognitive and mood disturbances. Given sister with h/o lupus, I did obtain ANA, ESR and CRP today to further eval for inflammatory arthritis condition. 14/18 positive tender points on exam today - indicating fibromyalgia as diagnosis.  Discussed this and provided with handout on FM.

## 2012-09-08 ENCOUNTER — Other Ambulatory Visit: Payer: Self-pay | Admitting: Family Medicine

## 2012-09-08 ENCOUNTER — Encounter: Payer: Self-pay | Admitting: Family Medicine

## 2012-09-08 DIAGNOSIS — E538 Deficiency of other specified B group vitamins: Secondary | ICD-10-CM | POA: Insufficient documentation

## 2012-09-08 DIAGNOSIS — E559 Vitamin D deficiency, unspecified: Secondary | ICD-10-CM

## 2012-09-08 HISTORY — DX: Vitamin D deficiency, unspecified: E55.9

## 2012-09-08 HISTORY — DX: Deficiency of other specified B group vitamins: E53.8

## 2012-09-08 LAB — ANA: Anti Nuclear Antibody(ANA): NEGATIVE

## 2012-09-08 MED ORDER — VITAMIN D 50 MCG (2000 UT) PO CAPS: 2000.0000 [IU] | ORAL_CAPSULE | Freq: Every day | ORAL | Status: DC

## 2012-09-09 ENCOUNTER — Ambulatory Visit (INDEPENDENT_AMBULATORY_CARE_PROVIDER_SITE_OTHER): Payer: 59 | Admitting: *Deleted

## 2012-09-09 DIAGNOSIS — E538 Deficiency of other specified B group vitamins: Secondary | ICD-10-CM

## 2012-09-09 MED ORDER — CYANOCOBALAMIN 1000 MCG/ML IJ SOLN
1000.0000 ug | Freq: Once | INTRAMUSCULAR | Status: AC
  Administered 2012-09-09: 1000 ug via INTRAMUSCULAR

## 2012-09-10 ENCOUNTER — Encounter: Payer: Self-pay | Admitting: Family Medicine

## 2012-09-11 ENCOUNTER — Encounter: Payer: Self-pay | Admitting: Family Medicine

## 2012-09-25 ENCOUNTER — Other Ambulatory Visit: Payer: Self-pay | Admitting: Family Medicine

## 2012-09-25 ENCOUNTER — Other Ambulatory Visit: Payer: Self-pay | Admitting: Cardiovascular Disease

## 2012-09-25 NOTE — Telephone Encounter (Signed)
Refilled Metoprolol sent to [id number] Refill#3.

## 2012-09-30 ENCOUNTER — Ambulatory Visit (INDEPENDENT_AMBULATORY_CARE_PROVIDER_SITE_OTHER): Payer: 59 | Admitting: Pulmonary Disease

## 2012-09-30 ENCOUNTER — Encounter: Payer: Self-pay | Admitting: Pulmonary Disease

## 2012-09-30 VITALS — BP 124/80 | HR 71 | Temp 98.5°F | Ht 65.0 in | Wt 304.0 lb

## 2012-09-30 DIAGNOSIS — G4710 Hypersomnia, unspecified: Secondary | ICD-10-CM

## 2012-09-30 NOTE — Progress Notes (Signed)
Subjective:    Patient ID: Kathleen Good, female    DOB: 10-21-1960, 52 y.o.   MRN: [id number]  HPI The patient is a 52 year old female who been asked to see for possible obstructive sleep apnea.  She is morbidly obese, and has been noted to have loud snoring, but no one has ever commented on an abnormal breathing pattern during sleep.  Her husband has sleep apnea and uses CPAP, and therefore he normally does not pay attention to her sleeping.  She has frequent awakenings at night but she blames on chronic musculoskeletal pain, and is not rested in the mornings upon arising.  She admits to having significant sleepiness during the day, and will fall asleep with any period of inactivity.  She will nap off and on during the day, and then she gets her "second wind" at night.  She denies any issues with driving, but only goes very short distances.  The patient states her weight is up 20 pounds of the last 1-2 years, and 100 pounds from 2004.  Her epworth score today is abnormal at 13.   Sleep Questionnaire What time do you typically go to bed?( Between what hours) 10-11p 10-11p at 1134 on 09/30/12 by Charlott Holler, RN How long does it take you to fall asleep? 10-31mins 10-74mins at 1134 on 09/30/12 by Charlott Holler, RN How many times during the night do you wake up? 3 3 at 1134 on 09/30/12 by Charlott Holler, RN What time do you get out of bed to start your day? 0700 0700 at 1134 on 09/30/12 by Charlott Holler, RN Do you drive or operate heavy machinery in your occupation? No No at 1134 on 09/30/12 by Charlott Holler, RN How much has your weight changed (up or down) over the past two years? (In pounds) 20 lb (9.072 kg)20 lb (9.072 kg) increase at 1134 on 09/30/12 by Charlott Holler, RN Have you ever had a sleep study before? No No at 1134 on 09/30/12 by Charlott Holler, RN Do you currently use CPAP? No No at 1134 on 09/30/12 by Charlott Holler, RN Do you wear oxygen at any  time? No No at 1134 on 09/30/12 by Charlott Holler, RN   Review of Systems  Constitutional: Negative for fever and unexpected weight change.  HENT: Positive for congestion. Negative for ear pain, nosebleeds, sore throat, rhinorrhea, sneezing, trouble swallowing, dental problem, postnasal drip and sinus pressure.   Eyes: Negative for redness and itching.  Respiratory: Positive for cough ( in the mornings), shortness of breath and wheezing. Negative for chest tightness.   Cardiovascular: Positive for palpitations and leg swelling.  Gastrointestinal: Positive for nausea and vomiting.  Genitourinary: Negative for dysuria.       Some incontinence  Musculoskeletal: Positive for arthralgias. Negative for joint swelling.  Skin: Positive for rash ( below breasts and on torso).  Neurological: Positive for headaches ( chronic).  Hematological: Does not bruise/bleed easily.  Psychiatric/Behavioral: Positive for agitation. Negative for dysphoric mood. The patient is nervous/anxious.        Objective:   Physical Exam Constitutional:  Morbidly obese female , no acute distress  HENT:  Nares patent without discharge  Oropharynx without exudate, palate and uvula are normal  Eyes:  Perrla, eomi, no scleral icterus  Neck:  No JVD, no TMG  Cardiovascular:  Normal rate, regular rhythm, no rubs or gallops.  No murmurs        Intact distal pulses  Pulmonary :  Normal breath sounds, no stridor or respiratory distress   No rales, rhonchi, or wheezing  Abdominal:  Soft, nondistended, bowel sounds present.  No tenderness noted.   Musculoskeletal:  1+ lower extremity edema noted.  Lymph Nodes:  No cervical lymphadenopathy noted  Skin:  No cyanosis noted  Neurologic:  Alert, appropriate, moves all 4 extremities without obvious deficit.         Assessment & Plan:

## 2012-09-30 NOTE — Patient Instructions (Addendum)
Will get you scheduled for a sleep study, and will arrange followup once the results are available.  Work on weight reduction.

## 2012-09-30 NOTE — Assessment & Plan Note (Signed)
The pt has significant sleep disruption and daytime sleepiness that I suspect is due to sleep apnea, but I cannot exclude a contribution from her chronic pain syndrome.   I have had a long discussion with her about the pathophysiology of sleep apnea, including its impact to her quality of life and cardiovascular health.  I think she would benefit from a sleep study at this point, and the patient is agreeable.  I have also encouraged her to work aggressively on weight loss.

## 2012-10-14 ENCOUNTER — Telehealth: Payer: Self-pay | Admitting: *Deleted

## 2012-10-14 ENCOUNTER — Ambulatory Visit (INDEPENDENT_AMBULATORY_CARE_PROVIDER_SITE_OTHER): Payer: 59 | Admitting: *Deleted

## 2012-10-14 DIAGNOSIS — E538 Deficiency of other specified B group vitamins: Secondary | ICD-10-CM

## 2012-10-14 MED ORDER — CYANOCOBALAMIN 1000 MCG/ML IJ SOLN
1000.0000 ug | Freq: Once | INTRAMUSCULAR | Status: AC
  Administered 2012-10-14: 1000 ug via INTRAMUSCULAR

## 2012-10-14 MED ORDER — CYANOCOBALAMIN 500 MCG/0.1ML NA SOLN: 500.0000 ug | NASAL | Status: DC

## 2012-10-14 NOTE — Telephone Encounter (Signed)
Lab Results  Component Value Date   VITAMINB12 142* 09/05/2012  I'd really prefer her to do injections if able because she was so low. If not available, will recommend intranasal cobalamin - sent in.

## 2012-10-14 NOTE — Telephone Encounter (Signed)
Do you want patient to start oral B12 or wait to see if we get any in before next month? 

## 2012-10-15 NOTE — Telephone Encounter (Signed)
Patient notified. She will check back with me prior to starting med to see if injection has arrived.

## 2012-10-22 ENCOUNTER — Ambulatory Visit (HOSPITAL_BASED_OUTPATIENT_CLINIC_OR_DEPARTMENT_OTHER): Payer: 59 | Attending: Pulmonary Disease | Admitting: Radiology

## 2012-10-22 VITALS — Ht 65.0 in | Wt 300.0 lb

## 2012-10-22 DIAGNOSIS — G4733 Obstructive sleep apnea (adult) (pediatric): Secondary | ICD-10-CM | POA: Insufficient documentation

## 2012-10-22 DIAGNOSIS — G4710 Hypersomnia, unspecified: Secondary | ICD-10-CM

## 2012-10-23 DIAGNOSIS — G4733 Obstructive sleep apnea (adult) (pediatric): Secondary | ICD-10-CM

## 2012-10-23 HISTORY — DX: Obstructive sleep apnea (adult) (pediatric): G47.33

## 2012-10-30 ENCOUNTER — Ambulatory Visit: Payer: 59 | Admitting: Pulmonary Disease

## 2012-10-30 DIAGNOSIS — G4730 Sleep apnea, unspecified: Secondary | ICD-10-CM

## 2012-10-30 DIAGNOSIS — G4710 Hypersomnia, unspecified: Secondary | ICD-10-CM

## 2012-10-31 ENCOUNTER — Encounter: Payer: Self-pay | Admitting: Family Medicine

## 2012-10-31 ENCOUNTER — Telehealth: Payer: Self-pay | Admitting: Pulmonary Disease

## 2012-10-31 NOTE — Telephone Encounter (Signed)
Spoke with pt and advised per Ashytn, that she will be contacted once the study is read to go over PSG results She verbalized understanding and states nothing further needed

## 2012-10-31 NOTE — Procedures (Signed)
NAME:  MONETTA, LICK NO.:  [id number]  MEDICAL RECORD NO.:  [id number]          PATIENT TYPE:  OUT  LOCATION:  SLEEP CENTER                 FACILITY[facility]  [facility]  PHYSICIAN:  Barbaraann Share, MD,FCCPDATE OF BIRTH:  Apr 16[birth year] 1963  DATE OF STUDY:  10/22/2012                           NOCTURNAL POLYSOMNOGRAM  REFERRING PHYSICIAN:  Barbaraann Share, MD,FCCP  INDICATION FOR STUDY:  Hypersomnia with sleep apnea.  EPWORTH SLEEPINESS SCORE:  15.  MEDICATIONS:  SLEEP ARCHITECTURE:  The patient had total sleep time of 244 minutes with decreased slow-wave sleep for age and also significantly decreased REM.  Sleep onset latency was prolonged at 53 minutes, and REM onset was prolonged at 270 minutes.  Sleep efficiency was severely reduced at 66%.  RESPIRATORY DATA:  The patient was found to have 15 apneas and 241 obstructive hypopneas, giving her an apnea-hypopnea index of 63 events per hour.  The events occurred in all body positions, and there was loud snoring noted throughout.  OXYGEN DATA:  There was O2 desaturation as low as 77% with the patient's obstructive events.  CARDIAC DATA:  No clinically significant arrhythmias were seen.  MOVEMENT-PARASOMNIA:  The patient had no significant leg jerks or other abnormal behaviors noted.  IMPRESSIONS-RECOMMENDATIONS:  Severe obstructive sleep apnea/hypopnea syndrome with an AHI of 63 events per hour and oxygen desaturation as low as 77%.  Treatment for this degree of sleep apnea should focus primarily on weight loss as well as CPAP.     Barbaraann Share, MD,FCCP Diplomate, American Board of Sleep Medicine    [facility]MED[facility]  D:  10/30/2012 14:12:29  T:  10/31/2012 02:58:59  Job:  [id number]

## 2012-10-31 NOTE — Telephone Encounter (Signed)
Patient returning [name]'s call.

## 2012-10-31 NOTE — Telephone Encounter (Signed)
Duplicate message. 

## 2012-10-31 NOTE — Telephone Encounter (Signed)
Patient scheduled to come in and review sleep study 5/15/ at 10am

## 2012-10-31 NOTE — Telephone Encounter (Signed)
LMOM x 1 

## 2012-10-31 NOTE — Telephone Encounter (Signed)
Error. New msg coming. Kathleen Good

## 2012-11-06 ENCOUNTER — Ambulatory Visit: Payer: 59 | Admitting: Pulmonary Disease

## 2012-11-14 ENCOUNTER — Ambulatory Visit (INDEPENDENT_AMBULATORY_CARE_PROVIDER_SITE_OTHER): Payer: 59 | Admitting: Family Medicine

## 2012-11-14 ENCOUNTER — Encounter: Payer: Self-pay | Admitting: Family Medicine

## 2012-11-14 VITALS — BP 124/84 | HR 84 | Temp 98.0°F | Wt 296.0 lb

## 2012-11-14 DIAGNOSIS — M545 Low back pain, unspecified: Secondary | ICD-10-CM

## 2012-11-14 MED ORDER — PREGABALIN 75 MG PO CAPS: 75.0000 mg | ORAL_CAPSULE | Freq: Two times a day (BID) | ORAL | Status: DC

## 2012-11-14 MED ORDER — MELOXICAM 15 MG PO TABS: ORAL_TABLET | ORAL | Status: DC
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
## 2012-11-14 NOTE — Patient Instructions (Signed)
Xray of lower back today Start meloxicam 15mg  daily for 5 days with food then as needed. Start lyrica as well for nerve type pain.  This may help fibromyalgia as well. Stretching exercises provided today. Work on weight loss

## 2012-11-14 NOTE — Assessment & Plan Note (Signed)
Anticipate herniated disc with mild L radiculopathy.  ?sacroiliitis. Given longstanding nature of pain - will obtain xrays of lumbar spine to eval disc spaces. Treat with meloxicam and lyrica (gabapentin caused hallucinations in past). Update if sxs worsening or red flags. Consider MRI and spine clinic referral if no better.

## 2012-11-14 NOTE — Addendum Note (Signed)
Addended by: Eustaquio Boyden on: 11/14/2012 10:59 AM   Modules accepted: Orders

## 2012-11-18 ENCOUNTER — Encounter: Payer: Self-pay | Admitting: Pulmonary Disease

## 2012-11-18 ENCOUNTER — Ambulatory Visit (INDEPENDENT_AMBULATORY_CARE_PROVIDER_SITE_OTHER): Payer: 59 | Admitting: Pulmonary Disease

## 2012-11-18 VITALS — BP 118/80 | HR 70 | Temp 97.6°F | Ht 66.25 in | Wt 301.0 lb

## 2012-11-18 DIAGNOSIS — G4733 Obstructive sleep apnea (adult) (pediatric): Secondary | ICD-10-CM

## 2012-11-18 NOTE — Assessment & Plan Note (Signed)
The patient's sleep study shows severe obstructive sleep apnea, and I have reviewed the study with her in detail.  The treatment of choice for this obese CPAP while she is trying to work on weight loss.  The patient is agreeable to this approach. I will set the patient up on cpap at a moderate pressure level to allow for desensitization, and will troubleshoot the device over the next 4-6weeks if needed.  The pt is to call me if having issues with tolerance.  Will then optimize the pressure once patient is able to wear cpap on a consistent basis.

## 2012-11-18 NOTE — Progress Notes (Signed)
  Subjective:    Patient ID: Kathleen Good, female    DOB: 1961-01-05, 52 y.o.   MRN: [id number]  HPI Patient comes in today for followup of her recent sleep study.  She was found to have severe objective sleep apnea, with an AHI of 63 events per hour and significant oxygen desaturation.  I have reviewed this study with her in detail, and answered all of her questions.   Review of Systems  Constitutional: Negative for fever and unexpected weight change.  HENT: Negative for ear pain, nosebleeds, congestion, sore throat, rhinorrhea, sneezing, trouble swallowing, dental problem, postnasal drip and sinus pressure.   Eyes: Negative for redness and itching.  Respiratory: Negative for cough, chest tightness, shortness of breath and wheezing.   Cardiovascular: Negative for palpitations and leg swelling.  Gastrointestinal: Negative for nausea and vomiting.  Genitourinary: Negative for dysuria.  Musculoskeletal: Negative for joint swelling.  Skin: Negative for rash.  Neurological: Negative for headaches.  Hematological: Does not bruise/bleed easily.  Psychiatric/Behavioral: Negative for dysphoric mood. The patient is not nervous/anxious.        Objective:   Physical Exam Morbidly obese female in no acute distress Nose without purulence or discharge noted No skin breakdown or pressure necrosis from the CPAP mask Neck without lymphadenopathy or thyromegaly Lower extremities with mild edema, no cyanosis Awake, but does appear to be sleepy, moves all 4 extremities.       Assessment & Plan:

## 2012-11-18 NOTE — Patient Instructions (Addendum)
Will start you on cpap at a moderate pressure level.  Please call if having tolerance issues. Work on weight loss followup with me in 6 weeks.

## 2012-11-19 ENCOUNTER — Encounter: Payer: Self-pay | Admitting: Family Medicine

## 2012-11-20 ENCOUNTER — Encounter: Payer: Self-pay | Admitting: Family Medicine

## 2012-11-23 ENCOUNTER — Encounter: Payer: Self-pay | Admitting: Family Medicine

## 2012-11-23 ENCOUNTER — Other Ambulatory Visit: Payer: Self-pay | Admitting: Family Medicine

## 2012-11-24 ENCOUNTER — Telehealth: Payer: Self-pay | Admitting: Pulmonary Disease

## 2012-11-24 ENCOUNTER — Ambulatory Visit
Admission: RE | Admit: 2012-11-24 | Discharge: 2012-11-24 | Disposition: A | Discharge status: Home / Self Care | Payer: 59 | Source: Ambulatory Visit | Attending: Family Medicine | Admitting: Family Medicine

## 2012-11-24 ENCOUNTER — Encounter: Payer: Self-pay | Admitting: Family Medicine

## 2012-11-24 ENCOUNTER — Telehealth: Payer: Self-pay | Admitting: Family Medicine

## 2012-11-24 DIAGNOSIS — M545 Low back pain: Secondary | ICD-10-CM

## 2012-11-24 MED ORDER — TRAMADOL HCL 50 MG PO TABS: 50.0000 mg | ORAL_TABLET | Freq: Two times a day (BID) | ORAL | Status: DC | PRN

## 2012-11-24 MED ORDER — CYCLOBENZAPRINE HCL 10 MG PO TABS: 10.0000 mg | ORAL_TABLET | Freq: Two times a day (BID) | ORAL | Status: AC | PRN

## 2012-11-24 NOTE — Telephone Encounter (Signed)
See My chart message

## 2012-11-24 NOTE — Telephone Encounter (Signed)
Opened in error

## 2012-11-24 NOTE — Telephone Encounter (Signed)
lmomtcb x1 for pt 

## 2012-11-24 NOTE — Telephone Encounter (Signed)
Please call the pt and let her know she has severe sleep apnea, and we will try to help her any way we can.  Just let us know if she wishes to pursue this more aggressively.

## 2012-11-24 NOTE — Telephone Encounter (Signed)
Ok to refill? Also requesting refill on Flexeril. (See Mychart message)

## 2012-11-24 NOTE — Telephone Encounter (Signed)
Per Christ[facility] Allegra New pap order patient unable to afford deductible and copay . Refused help care. Cancelled order per patient.

## 2012-11-25 NOTE — Telephone Encounter (Signed)
Spoke with pt and notified of recs per [facility] She verbalized understanding  Will call us back if she decides to persue tx

## 2012-11-25 NOTE — Telephone Encounter (Signed)
Pt returned call. [phone]. Kathleen Good

## 2012-11-27 ENCOUNTER — Encounter: Payer: Self-pay | Admitting: Family Medicine

## 2012-11-28 ENCOUNTER — Encounter: Payer: Self-pay | Admitting: Family Medicine

## 2012-12-01 ENCOUNTER — Encounter: Payer: Self-pay | Admitting: Family Medicine

## 2012-12-01 MED ORDER — CYANOCOBALAMIN 1000 MCG/ML IJ SOLN: 1000.0000 ug | Freq: Once | INTRAMUSCULAR | Status: DC

## 2012-12-01 NOTE — Telephone Encounter (Signed)
See Email - sounds like she will want to administer B12 at home, but will need refresher.

## 2012-12-01 NOTE — Telephone Encounter (Signed)
See email - nasal B12 not effective, too expensive. Pt would like to bring B12 shots from [facility] to get administered here. I've sent in b12 to her pharmacy.

## 2012-12-03 ENCOUNTER — Other Ambulatory Visit: Payer: Self-pay | Admitting: *Deleted

## 2012-12-03 ENCOUNTER — Ambulatory Visit (INDEPENDENT_AMBULATORY_CARE_PROVIDER_SITE_OTHER): Payer: 59 | Admitting: *Deleted

## 2012-12-03 DIAGNOSIS — E538 Deficiency of other specified B group vitamins: Secondary | ICD-10-CM

## 2012-12-03 MED ORDER — "NEEDLE (DISP) 25G X 1-1/2"" MISC": Status: DC

## 2012-12-03 MED ORDER — "NEEDLE (DISP) 18G X 1"" MISC": Status: DC

## 2012-12-03 MED ORDER — SYRINGE (DISPOSABLE) 3 ML MISC: Status: DC

## 2012-12-03 MED ORDER — CYANOCOBALAMIN 1000 MCG/ML IJ SOLN
1000.0000 ug | Freq: Once | INTRAMUSCULAR | Status: AC
  Administered 2012-12-03: 1000 ug via INTRAMUSCULAR

## 2012-12-30 ENCOUNTER — Ambulatory Visit: Payer: 59 | Admitting: Pulmonary Disease

## 2013-02-19 ENCOUNTER — Encounter: Payer: Self-pay | Admitting: Family Medicine

## 2013-02-19 DIAGNOSIS — E538 Deficiency of other specified B group vitamins: Secondary | ICD-10-CM

## 2013-02-19 DIAGNOSIS — E559 Vitamin D deficiency, unspecified: Secondary | ICD-10-CM

## 2013-02-19 DIAGNOSIS — E119 Type 2 diabetes mellitus without complications: Secondary | ICD-10-CM

## 2013-02-19 DIAGNOSIS — E785 Hyperlipidemia, unspecified: Secondary | ICD-10-CM

## 2013-02-19 NOTE — Telephone Encounter (Signed)
plz call to schedule fasting lab visit to check B12 level and afterwards for office visit to f/u diabetes Lab Results  Component Value Date   VITAMINB12 142* 09/05/2012

## 2013-02-20 ENCOUNTER — Encounter: Payer: Self-pay | Admitting: Family Medicine

## 2013-02-24 NOTE — Telephone Encounter (Signed)
Spoke with patient and appts scheduled. 

## 2013-03-04 ENCOUNTER — Other Ambulatory Visit: Payer: 59

## 2013-03-06 ENCOUNTER — Other Ambulatory Visit (INDEPENDENT_AMBULATORY_CARE_PROVIDER_SITE_OTHER): Payer: 59

## 2013-03-06 DIAGNOSIS — E669 Obesity, unspecified: Secondary | ICD-10-CM

## 2013-03-06 DIAGNOSIS — E559 Vitamin D deficiency, unspecified: Secondary | ICD-10-CM

## 2013-03-06 DIAGNOSIS — Z0000 Encounter for general adult medical examination without abnormal findings: Secondary | ICD-10-CM

## 2013-03-06 DIAGNOSIS — E119 Type 2 diabetes mellitus without complications: Secondary | ICD-10-CM

## 2013-03-06 DIAGNOSIS — R5381 Other malaise: Secondary | ICD-10-CM

## 2013-03-06 DIAGNOSIS — E538 Deficiency of other specified B group vitamins: Secondary | ICD-10-CM

## 2013-03-06 DIAGNOSIS — D649 Anemia, unspecified: Secondary | ICD-10-CM

## 2013-03-06 DIAGNOSIS — E785 Hyperlipidemia, unspecified: Secondary | ICD-10-CM

## 2013-03-06 DIAGNOSIS — I10 Essential (primary) hypertension: Secondary | ICD-10-CM

## 2013-03-06 LAB — BASIC METABOLIC PANEL
BUN: 9 mg/dL (ref 6–23)
CO2: 26 mEq/L (ref 19–32)
Calcium: 8.8 mg/dL (ref 8.4–10.5)
Chloride: 106 mEq/L (ref 96–112)
Creatinine, Ser: 0.7 mg/dL (ref 0.4–1.2)
GFR: 88.23 mL/min (ref >60.00)
Glucose, Bld: 101 mg/dL — ABNORMAL HIGH (ref 70–99)
Potassium: 3.5 mEq/L (ref 3.5–5.1)
Sodium: 138 mEq/L (ref 135–145)

## 2013-03-06 LAB — MICROALBUMIN / CREATININE URINE RATIO
Creatinine,U: 295.3 mg/dL
Microalb Creat Ratio: 1.1 mg/g (ref 0.0–30.0)
Microalb, Ur: 3.3 mg/dL — ABNORMAL HIGH (ref 0.0–1.9)

## 2013-03-06 LAB — LIPID PANEL
Cholesterol: 318 mg/dL — ABNORMAL HIGH (ref 0–200)
HDL: 33.4 mg/dL — ABNORMAL LOW (ref >39.00)
Total CHOL/HDL Ratio: 10
Triglycerides: 223 mg/dL — ABNORMAL HIGH (ref 0.0–149.0)
VLDL: 44.6 mg/dL — ABNORMAL HIGH (ref 0.0–40.0)

## 2013-03-06 LAB — HEMOGLOBIN A1C: Hgb A1c MFr Bld: 6.8 % — ABNORMAL HIGH (ref 4.6–6.5)

## 2013-03-06 LAB — VITAMIN B12: Vitamin B-12: 432 pg/mL (ref 211–911)

## 2013-03-06 LAB — LDL CHOLESTEROL, DIRECT: Direct LDL: 248.3 mg/dL

## 2013-03-07 LAB — VITAMIN D 25 HYDROXY (VIT D DEFICIENCY, FRACTURES): Vit D, 25-Hydroxy: 32 ng/mL (ref 30–89)

## 2013-03-10 ENCOUNTER — Ambulatory Visit (INDEPENDENT_AMBULATORY_CARE_PROVIDER_SITE_OTHER): Payer: 59 | Admitting: Family Medicine

## 2013-03-10 ENCOUNTER — Encounter: Payer: Self-pay | Admitting: Family Medicine

## 2013-03-10 VITALS — BP 128/96 | HR 109 | Temp 98.8°F | Wt 292.0 lb

## 2013-03-10 DIAGNOSIS — E538 Deficiency of other specified B group vitamins: Secondary | ICD-10-CM

## 2013-03-10 DIAGNOSIS — M797 Fibromyalgia: Secondary | ICD-10-CM

## 2013-03-10 DIAGNOSIS — F329 Major depressive disorder, single episode, unspecified: Secondary | ICD-10-CM

## 2013-03-10 DIAGNOSIS — Z23 Encounter for immunization: Secondary | ICD-10-CM

## 2013-03-10 DIAGNOSIS — IMO0001 Reserved for inherently not codable concepts without codable children: Secondary | ICD-10-CM

## 2013-03-10 DIAGNOSIS — E559 Vitamin D deficiency, unspecified: Secondary | ICD-10-CM

## 2013-03-10 DIAGNOSIS — E119 Type 2 diabetes mellitus without complications: Secondary | ICD-10-CM

## 2013-03-10 DIAGNOSIS — G4733 Obstructive sleep apnea (adult) (pediatric): Secondary | ICD-10-CM

## 2013-03-10 DIAGNOSIS — E785 Hyperlipidemia, unspecified: Secondary | ICD-10-CM

## 2013-03-10 MED ORDER — DULOXETINE HCL 20 MG PO CPEP: 20.0000 mg | ORAL_CAPSULE | Freq: Every day | ORAL | Status: DC

## 2013-03-10 MED ORDER — ATORVASTATIN CALCIUM 40 MG PO TABS: 40.0000 mg | ORAL_TABLET | Freq: Every day | ORAL | Status: DC

## 2013-03-10 NOTE — Assessment & Plan Note (Signed)
Presumed diagnosis. Several of her sxs consistent with FM dx, but may also be consistent with untreated sleep apnea.  See below re OSA Trial of cymbalta 20mg  daily - printed script for her to price out. ANA, TSH, ESR, CRP WNL in past.

## 2013-03-10 NOTE — Progress Notes (Signed)
  Subjective:    Patient ID: Kathleen Good, female    DOB: December 18, 1960, 52 y.o.   MRN: [id number]  HPI CC: f/u  Presents with husband today . Sick last night, vomited meds back up.  Attributes elevated bp and HR to this.  Intermittent episodes of burning epigastric discomfort associated with nausea and vomiting.  H/o severe OSA - has not started CPAP.  Too expensive copay ($300).  HLD - was on lipitor 40mg  regularly for years, but stopped a few months back because didn't feel it was helping.  Last B12 shot was about 2 wks ago at [facility].  Gets monthly. meloxicam - stopped 2/2 not helping pain, worsened GERD.  DM - doesn't check sugars.  Diet controlled.  Drinking more water.  Due for eye exam.  Fibro - endorses mental fogginess, chronic body (muscle and joint) aches. On prozac for diabetes, feels not helping mood as much as previously.  Has not been on cymbalta in past Wt Readings from Last 3 Encounters:  03/10/13 292 lb (132.45 kg)  11/18/12 301 lb (136.533 kg)  11/14/12 296 lb (134.265 kg)    Past Medical History  Diagnosis Date  . Depression   . GERD (gastroesophageal reflux disease)   . Hypertension   . Hyperglycemia   . Obesity   . Tobacco abuse   . Abdominal pain, unspecified site   . Chest pain, unspecified   . Diffuse cystic mastopathy   . Other specified disease of hair and hair follicles   . Calcaneal spur   . HLD (hyperlipidemia)   . Undiagnosed cardiac murmurs   . Personal history of colonic polyps   . Rash and other nonspecific skin eruption   . Tachycardia   . Persistent headaches     cervicogenic HA with migraine features, eval by Dr. Clarisse Gouge and others (5% permanent disability rating),  . DDD (degenerative disc disease), cervical     w/ occipital neuralgia, s/p bilateral upper cervical facet injections  . Colon polyp 11/2011    rpt due 5 yrs Leone Payor)  . History of acute mastitis 03/2012  . Vitamin B12 deficiency 09/08/2012  . Vitamin D deficiency  09/08/2012  . OSA (obstructive sleep apnea) 10/2012    severe, AHI 63, desat to 77% ([facility])     Review of Systems Per HPI    Objective:   Physical Exam  Nursing note and vitals reviewed. Constitutional: She appears well-developed and well-nourished. No distress.  obese  HENT:  Mouth/Throat: Oropharynx is clear and moist. No oropharyngeal exudate.  Cardiovascular: Normal rate, regular rhythm, normal heart sounds and intact distal pulses.   No murmur heard. Pulmonary/Chest: Effort normal and breath sounds normal. No respiratory distress. She has no wheezes. She has no rales.  Musculoskeletal: She exhibits no edema.  Diabetic foot exam: Normal inspection No skin breakdown No calluses  Normal DP/PT pulses Normal sensation to light touch and monofilament Nails normal       Assessment & Plan:

## 2013-03-10 NOTE — Assessment & Plan Note (Signed)
Chronic, stable. Continue meds.  Has not tolerated immediate release metformin in the past.  Does not check sugars at home. foot exam today.  Encouraged to schedule eye exam.

## 2013-03-10 NOTE — Assessment & Plan Note (Signed)
Longterm on prozac 40mg  daily - pt worried losing effectiveness. Discussed trial of cymbalta - pt willing to try after priced out.

## 2013-03-10 NOTE — Assessment & Plan Note (Signed)
Improved on supplementation. 

## 2013-03-10 NOTE — Assessment & Plan Note (Signed)
Improving with B12 shots monthly.

## 2013-03-10 NOTE — Patient Instructions (Addendum)
Flu shot today. Restart lipitor 40mg  daily regularly for 6 months then come in for fasting blood work to recheck cholesterol levels Prescription for cymbalta 20mg  printed out today - price out and you may try to help with fibro and pain. Return in 6 months fasting for blood work and afterwards for physical. Good to see you today, call us with questions.

## 2013-03-10 NOTE — Assessment & Plan Note (Signed)
Off statin.  I recommended restart statin and recheck FLP in 6 months to eval true effect of statin on cholesterol levels.  Pt agrees.

## 2013-03-10 NOTE — Assessment & Plan Note (Signed)
Severe OSA on sleep study conducted 09/2012.   States unable to afford $300 copay for CPAP machine. Discussed how several of her sxs are consistent with OSA as well.  Advised her to shop around for other DME companies, and to recheck with current company on price.

## 2013-03-19 ENCOUNTER — Other Ambulatory Visit: Payer: Self-pay | Admitting: Family Medicine

## 2013-04-20 ENCOUNTER — Other Ambulatory Visit: Payer: Self-pay | Admitting: Family Medicine

## 2013-04-21 MED ORDER — TRIAMCINOLONE ACETONIDE 0.5 % EX OINT: 1.0000 "application " | TOPICAL_OINTMENT | Freq: Two times a day (BID) | CUTANEOUS | Status: DC

## 2013-04-21 MED ORDER — DILTIAZEM HCL ER COATED BEADS 180 MG PO CP24: ORAL_CAPSULE | ORAL | Status: DC

## 2013-04-21 MED ORDER — METOPROLOL SUCCINATE ER 100 MG PO TB24: ORAL_TABLET | ORAL | Status: DC

## 2013-05-25 DIAGNOSIS — J189 Pneumonia, unspecified organism: Secondary | ICD-10-CM

## 2013-05-25 HISTORY — DX: Pneumonia, unspecified organism: J18.9

## 2013-05-30 ENCOUNTER — Emergency Department (INDEPENDENT_AMBULATORY_CARE_PROVIDER_SITE_OTHER): Payer: 59

## 2013-05-30 ENCOUNTER — Encounter (HOSPITAL_COMMUNITY): Payer: Self-pay | Admitting: Emergency Medicine

## 2013-05-30 ENCOUNTER — Emergency Department (HOSPITAL_COMMUNITY)
Admission: EM | Admit: 2013-05-30 | Discharge: 2013-05-30 | Disposition: A | Discharge status: Home / Self Care | Payer: 59 | Source: Home / Self Care | Attending: Emergency Medicine | Admitting: Emergency Medicine

## 2013-05-30 DIAGNOSIS — J111 Influenza due to unidentified influenza virus with other respiratory manifestations: Secondary | ICD-10-CM

## 2013-05-30 MED ORDER — IPRATROPIUM BROMIDE 0.02 % IN SOLN
RESPIRATORY_TRACT | Status: AC
  Filled 2013-05-30: qty 2.5

## 2013-05-30 MED ORDER — ALBUTEROL SULFATE (5 MG/ML) 0.5% IN NEBU
5.0000 mg | INHALATION_SOLUTION | Freq: Once | RESPIRATORY_TRACT | Status: AC
  Administered 2013-05-30: 5 mg via RESPIRATORY_TRACT

## 2013-05-30 MED ORDER — ALBUTEROL SULFATE HFA 108 (90 BASE) MCG/ACT IN AERS: 2.0000 | INHALATION_SPRAY | RESPIRATORY_TRACT | Status: DC | PRN

## 2013-05-30 MED ORDER — AEROCHAMBER PLUS FLO-VU LARGE MISC: 1.0000 | Freq: Once | Status: DC

## 2013-05-30 MED ORDER — SODIUM CHLORIDE 0.9 % IV BOLUS (SEPSIS)
1000.0000 mL | Freq: Once | INTRAVENOUS | Status: AC
  Administered 2013-05-30: 1000 mL via INTRAVENOUS

## 2013-05-30 MED ORDER — ALBUTEROL SULFATE (5 MG/ML) 0.5% IN NEBU
INHALATION_SOLUTION | RESPIRATORY_TRACT | Status: AC
  Filled 2013-05-30: qty 1

## 2013-05-30 MED ORDER — BENZONATATE 200 MG PO CAPS: 200.0000 mg | ORAL_CAPSULE | Freq: Three times a day (TID) | ORAL | Status: DC | PRN

## 2013-05-30 MED ORDER — OSELTAMIVIR PHOSPHATE 75 MG PO CAPS: 75.0000 mg | ORAL_CAPSULE | Freq: Two times a day (BID) | ORAL | Status: DC

## 2013-05-30 MED ORDER — IBUPROFEN 800 MG PO TABS
800.0000 mg | ORAL_TABLET | Freq: Once | ORAL | Status: AC
  Administered 2013-05-30: 800 mg via ORAL

## 2013-05-30 MED ORDER — IBUPROFEN 800 MG PO TABS
ORAL_TABLET | ORAL | Status: AC
  Filled 2013-05-30: qty 1

## 2013-05-30 MED ORDER — IPRATROPIUM BROMIDE 0.02 % IN SOLN
0.5000 mg | Freq: Once | RESPIRATORY_TRACT | Status: AC
  Administered 2013-05-30: 0.5 mg via RESPIRATORY_TRACT

## 2013-05-30 MED ORDER — SODIUM CHLORIDE 0.9 % IV SOLN: Freq: Once | INTRAVENOUS | Status: DC

## 2013-05-30 MED ORDER — SODIUM CHLORIDE 0.9 % IN NEBU
INHALATION_SOLUTION | RESPIRATORY_TRACT | Status: AC
  Filled 2013-05-30: qty 6

## 2013-05-30 NOTE — ED Notes (Signed)
Pt c/o cold sxs onset Thursday night Sxs include: dry cough, fever, vomiting due to cough, HA, wheezing Denies: diarrhea, SOB She is alert w/no signs of acute distress.
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
## 2013-05-30 NOTE — ED Provider Notes (Signed)
Medical screening examination/treatment/procedure(s) were performed by non-physician practitioner and as supervising physician I was immediately available for consultation/collaboration.  Leslee Home, M.D.  Reuben Likes, MD 05/30/13 [phone]

## 2013-05-31 ENCOUNTER — Inpatient Hospital Stay (HOSPITAL_COMMUNITY)
Admission: EM | Admit: 2013-05-31 | Discharge: 2013-06-03 | DRG: 194 | Disposition: A | Discharge status: Home / Self Care | Payer: 59 | Attending: Internal Medicine | Admitting: Internal Medicine

## 2013-05-31 ENCOUNTER — Emergency Department (HOSPITAL_COMMUNITY): Payer: 59

## 2013-05-31 ENCOUNTER — Encounter (HOSPITAL_COMMUNITY): Payer: Self-pay | Admitting: Emergency Medicine

## 2013-05-31 ENCOUNTER — Other Ambulatory Visit: Payer: Self-pay

## 2013-05-31 DIAGNOSIS — Z6841 Body Mass Index (BMI) 40.0 and over, adult: Secondary | ICD-10-CM

## 2013-05-31 DIAGNOSIS — I10 Essential (primary) hypertension: Secondary | ICD-10-CM

## 2013-05-31 DIAGNOSIS — J189 Pneumonia, unspecified organism: Secondary | ICD-10-CM

## 2013-05-31 DIAGNOSIS — G4733 Obstructive sleep apnea (adult) (pediatric): Secondary | ICD-10-CM | POA: Diagnosis present

## 2013-05-31 DIAGNOSIS — E785 Hyperlipidemia, unspecified: Secondary | ICD-10-CM | POA: Diagnosis present

## 2013-05-31 DIAGNOSIS — E119 Type 2 diabetes mellitus without complications: Secondary | ICD-10-CM

## 2013-05-31 DIAGNOSIS — F17200 Nicotine dependence, unspecified, uncomplicated: Secondary | ICD-10-CM | POA: Diagnosis present

## 2013-05-31 DIAGNOSIS — K219 Gastro-esophageal reflux disease without esophagitis: Secondary | ICD-10-CM | POA: Diagnosis present

## 2013-05-31 DIAGNOSIS — E669 Obesity, unspecified: Secondary | ICD-10-CM

## 2013-05-31 DIAGNOSIS — R079 Chest pain, unspecified: Secondary | ICD-10-CM

## 2013-05-31 DIAGNOSIS — I498 Other specified cardiac arrhythmias: Secondary | ICD-10-CM | POA: Diagnosis present

## 2013-05-31 DIAGNOSIS — R0602 Shortness of breath: Secondary | ICD-10-CM

## 2013-05-31 DIAGNOSIS — J1100 Influenza due to unidentified influenza virus with unspecified type of pneumonia: Principal | ICD-10-CM | POA: Diagnosis present

## 2013-05-31 DIAGNOSIS — R0902 Hypoxemia: Secondary | ICD-10-CM

## 2013-05-31 DIAGNOSIS — J111 Influenza due to unidentified influenza virus with other respiratory manifestations: Secondary | ICD-10-CM

## 2013-05-31 DIAGNOSIS — J209 Acute bronchitis, unspecified: Secondary | ICD-10-CM

## 2013-05-31 LAB — POCT I-STAT, CHEM 8
BUN: 3 mg/dL — ABNORMAL LOW (ref 6–23)
Calcium, Ion: 1.1 mmol/L — ABNORMAL LOW (ref 1.12–1.23)
Chloride: 101 mEq/L (ref 96–112)
Creatinine, Ser: 0.8 mg/dL (ref 0.50–1.10)
Glucose, Bld: 100 mg/dL — ABNORMAL HIGH (ref 70–99)
HCT: 42 % (ref 36.0–46.0)
Hemoglobin: 14.3 g/dL (ref 12.0–15.0)
Potassium: 3.2 mEq/L — ABNORMAL LOW (ref 3.5–5.1)
Sodium: 138 mEq/L (ref 135–145)
TCO2: 24 mmol/L (ref 0–100)

## 2013-05-31 LAB — CBC WITH DIFFERENTIAL/PLATELET
Basophils Absolute: 0.1 10*3/uL (ref 0.0–0.1)
Basophils Relative: 0 % (ref 0–1)
Eosinophils Absolute: 0 10*3/uL (ref 0.0–0.7)
Eosinophils Relative: 0 % (ref 0–5)
HCT: 36.5 % (ref 36.0–46.0)
Hemoglobin: 12.4 g/dL (ref 12.0–15.0)
Lymphocytes Relative: 12 % (ref 12–46)
Lymphs Abs: 1.9 10*3/uL (ref 0.7–4.0)
MCH: 30.7 pg (ref 26.0–34.0)
MCHC: 34 g/dL (ref 30.0–36.0)
MCV: 90.3 fL (ref 78.0–100.0)
Monocytes Absolute: 2.1 10*3/uL — ABNORMAL HIGH (ref 0.1–1.0)
Monocytes Relative: 13 % — ABNORMAL HIGH (ref 3–12)
Neutro Abs: 11.7 10*3/uL — ABNORMAL HIGH (ref 1.7–7.7)
Neutrophils Relative %: 74 % (ref 43–77)
Platelets: 293 10*3/uL (ref 150–400)
RBC: 4.04 MIL/uL (ref 3.87–5.11)
RDW: 14.5 % (ref 11.5–15.5)
WBC: 15.7 10*3/uL — ABNORMAL HIGH (ref 4.0–10.5)

## 2013-05-31 LAB — CG4 I-STAT (LACTIC ACID): Lactic Acid, Venous: 1.38 mmol/L (ref 0.5–2.2)

## 2013-05-31 MED ORDER — ASPIRIN 81 MG PO CHEW
81.0000 mg | CHEWABLE_TABLET | Freq: Every day | ORAL | Status: DC
  Administered 2013-06-01 – 2013-06-03 (×3): 81 mg via ORAL
  Filled 2013-05-31 (×3): qty 1

## 2013-05-31 MED ORDER — ONDANSETRON HCL 4 MG/2ML IJ SOLN: 4.0000 mg | Freq: Four times a day (QID) | INTRAMUSCULAR | Status: DC | PRN

## 2013-05-31 MED ORDER — SODIUM CHLORIDE 0.9 % IV BOLUS (SEPSIS)
1000.0000 mL | Freq: Once | INTRAVENOUS | Status: AC
  Administered 2013-05-31: 1000 mL via INTRAVENOUS

## 2013-05-31 MED ORDER — ACETAMINOPHEN 325 MG PO TABS
650.0000 mg | ORAL_TABLET | Freq: Four times a day (QID) | ORAL | Status: DC | PRN
  Administered 2013-06-01: 650 mg via ORAL
  Filled 2013-05-31: qty 2

## 2013-05-31 MED ORDER — LEVALBUTEROL HCL 0.63 MG/3ML IN NEBU: 0.6300 mg | INHALATION_SOLUTION | Freq: Four times a day (QID) | RESPIRATORY_TRACT | Status: DC | PRN

## 2013-05-31 MED ORDER — DILTIAZEM HCL ER COATED BEADS 180 MG PO CP24
180.0000 mg | ORAL_CAPSULE | Freq: Every day | ORAL | Status: DC
  Administered 2013-06-01 – 2013-06-03 (×3): 180 mg via ORAL
  Filled 2013-05-31 (×3): qty 1

## 2013-05-31 MED ORDER — ONDANSETRON HCL 4 MG PO TABS: 4.0000 mg | ORAL_TABLET | Freq: Four times a day (QID) | ORAL | Status: DC | PRN

## 2013-05-31 MED ORDER — PANTOPRAZOLE SODIUM 40 MG PO TBEC
40.0000 mg | DELAYED_RELEASE_TABLET | Freq: Every day | ORAL | Status: DC
  Administered 2013-06-01 – 2013-06-03 (×3): 40 mg via ORAL
  Filled 2013-05-31 (×3): qty 1

## 2013-05-31 MED ORDER — BENZONATATE 100 MG PO CAPS
200.0000 mg | ORAL_CAPSULE | Freq: Three times a day (TID) | ORAL | Status: DC | PRN
  Administered 2013-06-01: 200 mg via ORAL
  Filled 2013-05-31: qty 2

## 2013-05-31 MED ORDER — TRAMADOL HCL 50 MG PO TABS
50.0000 mg | ORAL_TABLET | Freq: Two times a day (BID) | ORAL | Status: DC | PRN
  Administered 2013-06-01 – 2013-06-02 (×2): 50 mg via ORAL
  Filled 2013-05-31 (×2): qty 1

## 2013-05-31 MED ORDER — AEROCHAMBER PLUS FLO-VU LARGE MISC
1.0000 | Freq: Once | Status: DC
  Filled 2013-05-31: qty 1

## 2013-05-31 MED ORDER — DULOXETINE HCL 20 MG PO CPEP
20.0000 mg | ORAL_CAPSULE | Freq: Every day | ORAL | Status: DC
  Administered 2013-06-01 – 2013-06-03 (×3): 20 mg via ORAL
  Filled 2013-05-31 (×3): qty 1

## 2013-05-31 MED ORDER — INSULIN ASPART 100 UNIT/ML ~~LOC~~ SOLN
0.0000 [IU] | Freq: Three times a day (TID) | SUBCUTANEOUS | Status: DC
  Administered 2013-06-02 – 2013-06-03 (×2): 1 [IU] via SUBCUTANEOUS

## 2013-05-31 MED ORDER — SODIUM CHLORIDE 0.9 % IJ SOLN
3.0000 mL | Freq: Two times a day (BID) | INTRAMUSCULAR | Status: DC
  Administered 2013-06-01 – 2013-06-03 (×5): 3 mL via INTRAVENOUS

## 2013-05-31 MED ORDER — ENOXAPARIN SODIUM 80 MG/0.8ML ~~LOC~~ SOLN
65.0000 mg | SUBCUTANEOUS | Status: DC
  Administered 2013-06-01 – 2013-06-03 (×3): 65 mg via SUBCUTANEOUS
  Filled 2013-05-31 (×3): qty 0.8

## 2013-05-31 MED ORDER — ONDANSETRON HCL 4 MG/2ML IJ SOLN
4.0000 mg | Freq: Once | INTRAMUSCULAR | Status: AC
  Administered 2013-05-31: 4 mg via INTRAVENOUS
  Filled 2013-05-31: qty 2

## 2013-05-31 MED ORDER — CYCLOBENZAPRINE HCL 10 MG PO TABS
10.0000 mg | ORAL_TABLET | Freq: Two times a day (BID) | ORAL | Status: DC | PRN
  Administered 2013-06-01 – 2013-06-02 (×2): 10 mg via ORAL
  Filled 2013-05-31 (×2): qty 1

## 2013-05-31 MED ORDER — TRIAMCINOLONE ACETONIDE 0.5 % EX OINT
1.0000 "application " | TOPICAL_OINTMENT | Freq: Two times a day (BID) | CUTANEOUS | Status: DC | PRN
  Filled 2013-05-31: qty 15

## 2013-05-31 MED ORDER — POTASSIUM CHLORIDE CRYS ER 20 MEQ PO TBCR
40.0000 meq | EXTENDED_RELEASE_TABLET | Freq: Once | ORAL | Status: AC
  Administered 2013-05-31: 40 meq via ORAL
  Filled 2013-05-31: qty 2

## 2013-05-31 MED ORDER — ACETAMINOPHEN 650 MG RE SUPP: 650.0000 mg | Freq: Four times a day (QID) | RECTAL | Status: DC | PRN

## 2013-05-31 MED ORDER — ATORVASTATIN CALCIUM 40 MG PO TABS
40.0000 mg | ORAL_TABLET | Freq: Every day | ORAL | Status: DC
  Administered 2013-06-01 – 2013-06-03 (×3): 40 mg via ORAL
  Filled 2013-05-31 (×3): qty 1

## 2013-05-31 MED ORDER — SODIUM CHLORIDE 0.9 % IJ SOLN
3.0000 mL | Freq: Two times a day (BID) | INTRAMUSCULAR | Status: DC
  Administered 2013-06-01 – 2013-06-02 (×4): 3 mL via INTRAVENOUS

## 2013-05-31 MED ORDER — FLUOXETINE HCL 20 MG PO CAPS
40.0000 mg | ORAL_CAPSULE | Freq: Every day | ORAL | Status: DC
  Administered 2013-06-01 – 2013-06-03 (×3): 40 mg via ORAL
  Filled 2013-05-31 (×3): qty 2

## 2013-05-31 MED ORDER — LEVALBUTEROL HCL 0.63 MG/3ML IN NEBU
0.6300 mg | INHALATION_SOLUTION | Freq: Once | RESPIRATORY_TRACT | Status: AC
  Administered 2013-05-31: 0.63 mg via RESPIRATORY_TRACT
  Filled 2013-05-31: qty 3

## 2013-05-31 MED ORDER — METOPROLOL SUCCINATE ER 100 MG PO TB24
200.0000 mg | ORAL_TABLET | Freq: Every day | ORAL | Status: DC
Start: 2013-05-31 — End: 2013-06-03
  Administered 2013-05-31 – 2013-06-03 (×4): 200 mg via ORAL
  Filled 2013-05-31 (×4): qty 2

## 2013-05-31 MED ORDER — VITAMIN D 1000 UNITS PO TABS
2000.0000 [IU] | ORAL_TABLET | Freq: Every day | ORAL | Status: DC
  Administered 2013-06-01 – 2013-06-03 (×3): 2000 [IU] via ORAL
  Filled 2013-05-31 (×4): qty 2

## 2013-05-31 MED ORDER — PROPRANOLOL HCL 40 MG PO TABS
40.0000 mg | ORAL_TABLET | Freq: Three times a day (TID) | ORAL | Status: DC | PRN
  Filled 2013-05-31: qty 1

## 2013-05-31 MED ORDER — METOPROLOL SUCCINATE ER 100 MG PO TB24
200.0000 mg | ORAL_TABLET | Freq: Every day | ORAL | Status: DC
  Filled 2013-05-31: qty 2

## 2013-05-31 MED ORDER — BUDESONIDE 0.25 MG/2ML IN SUSP
0.2500 mg | Freq: Two times a day (BID) | RESPIRATORY_TRACT | Status: DC
  Administered 2013-06-01 – 2013-06-03 (×5): 0.25 mg via RESPIRATORY_TRACT
  Filled 2013-05-31 (×8): qty 2

## 2013-05-31 MED ORDER — LEVALBUTEROL HCL 0.63 MG/3ML IN NEBU
0.6300 mg | INHALATION_SOLUTION | Freq: Four times a day (QID) | RESPIRATORY_TRACT | Status: DC
  Administered 2013-06-01 (×2): 0.63 mg via RESPIRATORY_TRACT
  Filled 2013-05-31 (×6): qty 3

## 2013-05-31 MED ORDER — IPRATROPIUM BROMIDE 0.02 % IN SOLN
0.5000 mg | Freq: Four times a day (QID) | RESPIRATORY_TRACT | Status: DC
  Administered 2013-06-01 (×2): 0.5 mg via RESPIRATORY_TRACT
  Filled 2013-05-31 (×2): qty 2.5

## 2013-05-31 MED ORDER — FERROUS FUMARATE 325 (106 FE) MG PO TABS
1.0000 | ORAL_TABLET | ORAL | Status: DC
  Administered 2013-06-01 – 2013-06-03 (×2): 106 mg via ORAL
  Filled 2013-05-31 (×2): qty 1

## 2013-05-31 MED ORDER — LEVOFLOXACIN IN D5W 500 MG/100ML IV SOLN
500.0000 mg | INTRAVENOUS | Status: DC
  Administered 2013-06-01 (×2): 500 mg via INTRAVENOUS
  Filled 2013-05-31 (×2): qty 100

## 2013-05-31 MED ORDER — CYANOCOBALAMIN 1000 MCG/ML IJ SOLN
1000.0000 ug | Freq: Once | INTRAMUSCULAR | Status: AC
  Administered 2013-06-01: 1000 ug via INTRAMUSCULAR
  Filled 2013-05-31: qty 1

## 2013-05-31 MED ORDER — OSELTAMIVIR PHOSPHATE 75 MG PO CAPS
75.0000 mg | ORAL_CAPSULE | Freq: Two times a day (BID) | ORAL | Status: DC
  Administered 2013-06-01 (×2): 75 mg via ORAL
  Filled 2013-05-31 (×3): qty 1

## 2013-05-31 NOTE — ED Notes (Signed)
Pt reports going to [facility]cc yesterday for cold and flu symptoms, n/v and diagnosed with the flu. No relief with tamiflu and tessalon, still having cough, headache, n/v. Airway intact, mask on pt at triage.

## 2013-05-31 NOTE — Progress Notes (Signed)
11:20 PM  lovenox for VTE ppx.    Lovenox changed to 0.5mg /kg q24 h per protocol for VTE ppx. Normal renal function  Janice Coffin

## 2013-05-31 NOTE — ED Notes (Signed)
IV access and blood draw attempted x2 without success.

## 2013-05-31 NOTE — Progress Notes (Signed)
52yo female c/o cough, SOB, and fever, had gone to [facility] yesterday w/ no relief from Tamiflu and Tessalon, to begin IV ABX for bronchitis.  Will begin Levaquin 500mg  IV Q24hr for CrCl >100 and monitor.  Vernard Gambles, PharmD, BCPS  05/31/2013 11:37 PM
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
## 2013-05-31 NOTE — ED Notes (Signed)
IV team re-paged. 2RN's unable to obtain IV access
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
## 2013-05-31 NOTE — ED Notes (Signed)
NOTIFIED DR. PICKERING IN PERSON OF PATIENTS LAB RESULTS OF CG4 LACTIC ACID = 1.38 mmoI/L, @17 :45 PM ,05/31/2013.

## 2013-05-31 NOTE — ED Notes (Signed)
Per RN on 6E, pt's room is being cleaned and will be another . Pt updated on delay.

## 2013-06-01 ENCOUNTER — Telehealth: Payer: Self-pay

## 2013-06-01 ENCOUNTER — Inpatient Hospital Stay (HOSPITAL_COMMUNITY): Payer: 59

## 2013-06-01 ENCOUNTER — Encounter (HOSPITAL_COMMUNITY): Payer: Self-pay | Admitting: General Practice

## 2013-06-01 DIAGNOSIS — R079 Chest pain, unspecified: Secondary | ICD-10-CM

## 2013-06-01 LAB — BASIC METABOLIC PANEL
BUN: 5 mg/dL — ABNORMAL LOW (ref 6–23)
CO2: 27 mEq/L (ref 19–32)
Calcium: 8.4 mg/dL (ref 8.4–10.5)
Chloride: 102 mEq/L (ref 96–112)
Creatinine, Ser: 0.71 mg/dL (ref 0.50–1.10)
GFR calc Af Amer: >90 mL/min (ref >90)
GFR calc non Af Amer: >90 mL/min (ref >90)
Glucose, Bld: 100 mg/dL — ABNORMAL HIGH (ref 70–99)
Potassium: 4.8 mEq/L (ref 3.5–5.1)
Sodium: 136 mEq/L (ref 135–145)

## 2013-06-01 LAB — INFLUENZA PANEL BY PCR (TYPE A & B)
H1N1 flu by pcr: NOT DETECTED
Influenza A By PCR: NEGATIVE
Influenza B By PCR: NEGATIVE

## 2013-06-01 LAB — GLUCOSE, CAPILLARY
Glucose-Capillary: 117 mg/dL — ABNORMAL HIGH (ref 70–99)
Glucose-Capillary: 103 mg/dL — ABNORMAL HIGH (ref 70–99)
Glucose-Capillary: 83 mg/dL (ref 70–99)
Glucose-Capillary: 117 mg/dL — ABNORMAL HIGH (ref 70–99)

## 2013-06-01 LAB — PRO B NATRIURETIC PEPTIDE: Pro B Natriuretic peptide (BNP): 189.6 pg/mL — ABNORMAL HIGH (ref 0–125)

## 2013-06-01 LAB — CBC
HCT: 35.9 % — ABNORMAL LOW (ref 36.0–46.0)
Hemoglobin: 11.8 g/dL — ABNORMAL LOW (ref 12.0–15.0)
MCH: 30.5 pg (ref 26.0–34.0)
MCHC: 32.9 g/dL (ref 30.0–36.0)
MCV: 92.8 fL (ref 78.0–100.0)
Platelets: 271 10*3/uL (ref 150–400)
RBC: 3.87 MIL/uL (ref 3.87–5.11)
RDW: 14.5 % (ref 11.5–15.5)
WBC: 13.8 10*3/uL — ABNORMAL HIGH (ref 4.0–10.5)

## 2013-06-01 LAB — D-DIMER, QUANTITATIVE (NOT AT ARMC): D-Dimer, Quant: 2.12 ug/mL-FEU — ABNORMAL HIGH (ref 0.00–0.48)

## 2013-06-01 LAB — CREATININE, SERUM
Creatinine, Ser: 0.66 mg/dL (ref 0.50–1.10)
GFR calc Af Amer: >90 mL/min (ref >90)
GFR calc non Af Amer: >90 mL/min (ref >90)

## 2013-06-01 LAB — TSH: TSH: 1.571 u[IU]/mL (ref 0.350–4.500)

## 2013-06-01 LAB — TROPONIN I: Troponin I: <0.3 ng/mL (ref <0.30)

## 2013-06-01 MED ORDER — IOHEXOL 350 MG/ML SOLN
100.0000 mL | Freq: Once | INTRAVENOUS | Status: AC | PRN
  Administered 2013-06-01: 100 mL via INTRAVENOUS

## 2013-06-01 MED ORDER — IPRATROPIUM BROMIDE 0.02 % IN SOLN
0.5000 mg | Freq: Three times a day (TID) | RESPIRATORY_TRACT | Status: DC
  Administered 2013-06-01 – 2013-06-02 (×5): 0.5 mg via RESPIRATORY_TRACT
  Filled 2013-06-01 (×5): qty 2.5

## 2013-06-01 MED ORDER — LEVALBUTEROL HCL 0.63 MG/3ML IN NEBU
0.6300 mg | INHALATION_SOLUTION | Freq: Three times a day (TID) | RESPIRATORY_TRACT | Status: DC
  Administered 2013-06-01 – 2013-06-02 (×5): 0.63 mg via RESPIRATORY_TRACT
  Filled 2013-06-01 (×11): qty 3

## 2013-06-01 NOTE — Progress Notes (Signed)
[facility] PROGRESS NOTE  Kathleen Good ZOX:[id number] DOB: [birth year] DOA: 05/31/2013 PCP: Eustaquio Boyden, MD   Brief narrative 52 year old obese female with history of obstructive sleep apnea, hypertension, chronic tachycardia, hyperlipidemia, diabetes mellitus diet-controlled, tobacco abuse presented with worsening cough with shortness of breath  for last 3-4 days subjective fevers and chills. She also had a chest tightness for 2 days. She had an episode of vomiting after prolonged bout of cough yesterday. Patient was admitted for findings of acute bronchitis. She was noted to be tachycardic to 130s, with low grade temperature and elevated d dimer as well.    Assessment/Plan: Shortness of breath Possibly secondary to acute bronchitis however given symptom of chest tightness and elevated d-dimer with acute onset of symptoms we will get CT angiogram of the chest to rule out PE. -Continue nebulizer and Pulmicort. Continue empiric Levaquin. -Check for BCR. On empiric Tamiflu. -O2 via nasal cannula  Chronic sinus tachycardia Continue with metoprolol and Cardizem. TSH within normal limit.  elevated d-dimer noted .  ordered for CT angiogram to rule out for PE given associated chest tightness. She denies any recent travel, trauma and being bedbound  And use of OCPs . Reports sedentary lifestyle.  Diabetes mellitus Diet-controlled. Continue SSI  obstructive sleep apnea Reports unable to afford CPAP  Diet: Diabetic DVT prophylaxis: Subcutaneous Lovenox  Code Status: Full code  Family Communication: None at bedside  Disposition Plan: Home once improved   Consultants:  None  Procedures:  none  Antibiotics:  IV levaquin  HPI/Subjective: Feels congested. C/o anterior chest tightness   Objective: Filed Vitals:   06/01/13 0827  BP: 143/86  Pulse: 95  Temp: 98.5 F (36.9 C)  Resp: 18    Intake/Output Summary (Last 24 hours) at 06/01/13 1228 Last data filed at  06/01/13 0820  Gross per 24 hour  Intake    240 ml  Output      0 ml  Net    240 ml   Filed Weights   05/31/13 2157  Weight: 130.092 kg (286 lb 12.8 oz)    Exam:   General:  Middle aged obese female lying in bed appears congested  HEENT: No pallor, moist oral mucosa, nasal congestion  Chest: Scattered rhonchi, no crackles or wheezing  CVS: Normal S1-S2, no murmurs rub or gallop  Abdomen will soft, nontender, nondistended, bowel sounds present  Extremities: Warm, no edema, no calf tenderness  CNS: AAO x3     Data Reviewed: Basic Metabolic Panel:  Recent Labs Lab 05/31/13 0035 05/31/13 1734 06/01/13 0602  NA  --  138 136  K  --  3.2* 4.8  CL  --  101 102  CO2  --   --  27  GLUCOSE  --  100* 100*  BUN  --  3* 5*  CREATININE 0.66 0.80 0.71  CALCIUM  --   --  8.4   Liver Function Tests: No results found for this basename: AST, ALT, ALKPHOS, BILITOT, PROT, ALBUMIN,  in the last 168 hours No results found for this basename: LIPASE, AMYLASE,  in the last 168 hours No results found for this basename: AMMONIA,  in the last 168 hours CBC:  Recent Labs Lab 05/31/13 1734 05/31/13 1800 06/01/13 0602  WBC  --  15.7* 13.8*  NEUTROABS  --  11.7*  --   HGB 14.3 12.4 11.8*  HCT 42.0 36.5 35.9*  MCV  --  90.3 92.8  PLT  --  293 271   Cardiac  Enzymes:  Recent Labs Lab 05/31/13 0035  TROPONINI <0.30   BNP (last 3 results)  Recent Labs  05/31/13 0035  PROBNP 189.6*   CBG:  Recent Labs Lab 06/01/13 0824 06/01/13 1143  GLUCAP 117* 103*    No results found for this or any previous visit (from the past 240 hour(s)).   Studies: Dg Chest 2 View  05/31/2013   CLINICAL DATA:  Cough, fever, congestion  EXAM: CHEST  2 VIEW  COMPARISON:  05/30/2013  FINDINGS: Cardiomediastinal silhouette is stable. No acute infiltrate or pleural effusion. No pulmonary edema. Bony thorax is stable.  IMPRESSION: No active cardiopulmonary disease.   Electronically Signed   By:  Natasha Mead M.D.   On: 05/31/2013 17:48   Dg Chest 2 View  05/30/2013   CLINICAL DATA:  Cough, congestion, fever  EXAM: CHEST  2 VIEW  COMPARISON:  01/ 22/11  FINDINGS: Cardiomediastinal silhouette is stable. No acute infiltrate or pulmonary edema. Metallic fixation plate cervical spine again noted. Bony thorax is stable.  IMPRESSION: No active cardiopulmonary disease.   Electronically Signed   By: Natasha Mead M.D.   On: 05/30/2013 16:09    Scheduled Meds: . AEROCHAMBER PLUS FLO-VU LARGE  1 each Other Once  . aspirin  81 mg Oral Daily  . atorvastatin  40 mg Oral Daily  . budesonide (PULMICORT) nebulizer solution  0.25 mg Nebulization BID  . cholecalciferol  2,000 Units Oral Daily  . diltiazem  180 mg Oral Daily  . DULoxetine  20 mg Oral Daily  . enoxaparin (LOVENOX) injection  65 mg Subcutaneous Q24H  . ferrous fumarate  1 tablet Oral QODAY  . FLUoxetine  40 mg Oral Daily  . insulin aspart  0-9 Units Subcutaneous TID WC  . ipratropium  0.5 mg Nebulization TID  . levalbuterol  0.63 mg Nebulization TID  . levofloxacin (LEVAQUIN) IV  500 mg Intravenous Q24H  . metoprolol succinate  200 mg Oral Daily  . metoprolol succinate  200 mg Oral QHS  . oseltamivir  75 mg Oral BID  . pantoprazole  40 mg Oral Daily  . sodium chloride  3 mL Intravenous Q12H  . sodium chloride  3 mL Intravenous Q12H   Continuous Infusions:     Time spent:25 minutes    Kathleen Good  [facility] Pager [phone]. If 7PM-7AM, please contact night-coverage at [email or web address], password [facility] 06/01/2013, 12:28 PM  LOS: 1 day

## 2013-06-01 NOTE — Telephone Encounter (Signed)
See CAN fax in your in box. 

## 2013-06-02 DIAGNOSIS — E669 Obesity, unspecified: Secondary | ICD-10-CM

## 2013-06-02 DIAGNOSIS — J189 Pneumonia, unspecified organism: Secondary | ICD-10-CM | POA: Diagnosis present

## 2013-06-02 DIAGNOSIS — E119 Type 2 diabetes mellitus without complications: Secondary | ICD-10-CM

## 2013-06-02 LAB — GLUCOSE, CAPILLARY
Glucose-Capillary: 95 mg/dL (ref 70–99)
Glucose-Capillary: 114 mg/dL — ABNORMAL HIGH (ref 70–99)
Glucose-Capillary: 137 mg/dL — ABNORMAL HIGH (ref 70–99)
Glucose-Capillary: 187 mg/dL — ABNORMAL HIGH (ref 70–99)

## 2013-06-02 MED ORDER — PREDNISONE 20 MG PO TABS
40.0000 mg | ORAL_TABLET | Freq: Every day | ORAL | Status: DC
  Administered 2013-06-02 – 2013-06-03 (×2): 40 mg via ORAL
  Filled 2013-06-02 (×3): qty 2

## 2013-06-02 MED ORDER — LEVOFLOXACIN IN D5W 750 MG/150ML IV SOLN
750.0000 mg | INTRAVENOUS | Status: DC
  Administered 2013-06-02: 750 mg via INTRAVENOUS
  Filled 2013-06-02 (×2): qty 150

## 2013-06-02 NOTE — Progress Notes (Signed)
Utilization review completed.  

## 2013-06-02 NOTE — Progress Notes (Addendum)
[facility] PROGRESS NOTE  Kathleen Good ZOX:[id number] DOB: June 08, 1961 DOA: 05/31/2013 PCP: Eustaquio Boyden, MD  Brief narrative  52 year old obese female with history of obstructive sleep apnea, hypertension, chronic tachycardia, hyperlipidemia, diabetes mellitus diet-controlled, tobacco abuse presented with worsening cough with shortness of breath for last 3-4 days subjective fevers and chills. She also had a chest tightness for 2 days. She had an episode of vomiting after prolonged bout of cough yesterday.  Patient was admitted for findings of acute bronchitis. She was noted to be tachycardic to 130s, with low grade temperature and elevated d dimer as well.   Assessment/Plan:  Shortness of breath  Possibly secondary to CAP/ acute bronchitis  Had chest tightness with elevated D-dimer. CT angio chest was negative for PE. -Continue nebulizer and Pulmicort. Continue empiric Levaquin.  Will add po prednisone. -O2 via nasal cannula PRN.   Chronic sinus tachycardia  Continue with metoprolol and Cardizem. TSH within normal limit. elevated d-dimer noted . CT chest ruled out for PE.  HR stable.  Diabetes mellitus  Diet-controlled. Continue SSI   obstructive sleep apnea  Reports unable to afford CPAP   Diet: Diabetic   DVT prophylaxis: Subcutaneous Lovenox   Code Status: Full code   Family Communication: None at bedside   Disposition Plan: Home likely tomorrow if  improved   Consultants:  None   Procedures:  None   Antibiotics:  IV levaquin( 12/9>>)   HPI/Subjective:  SOB and chest congestion improved. afebrile   Objective: Filed Vitals:   06/02/13 0930  BP: 119/82  Pulse: 81  Temp: 99 F (37.2 C)  Resp: 18    Intake/Output Summary (Last 24 hours) at 06/02/13 1317 Last data filed at 06/02/13 0900  Gross per 24 hour  Intake   1180 ml  Output      7 ml  Net   1173 ml   Filed Weights   05/31/13 2157 06/01/13 2206  Weight: 130.092 kg (286 lb 12.8 oz)  113.807 kg (250 lb 14.4 oz)    Exam: General: Middle aged obese female lying in bed in NAD  HEENT: No pallor, moist oral mucosa,   Chest: Scattered rhonchi, no crackles or wheezing  CVS: Normal S1-S2, no murmurs rub or gallop  Abdomen will soft, nontender, nondistended, bowel sounds present  Extremities: Warm, no edema,  CNS: AAO x3    Data Reviewed: Basic Metabolic Panel:  Recent Labs Lab 05/31/13 0035 05/31/13 1734 06/01/13 0602  NA  --  138 136  K  --  3.2* 4.8  CL  --  101 102  CO2  --   --  27  GLUCOSE  --  100* 100*  BUN  --  3* 5*  CREATININE 0.66 0.80 0.71  CALCIUM  --   --  8.4   Liver Function Tests: No results found for this basename: AST, ALT, ALKPHOS, BILITOT, PROT, ALBUMIN,  in the last 168 hours No results found for this basename: LIPASE, AMYLASE,  in the last 168 hours No results found for this basename: AMMONIA,  in the last 168 hours CBC:  Recent Labs Lab 05/31/13 1734 05/31/13 1800 06/01/13 0602  WBC  --  15.7* 13.8*  NEUTROABS  --  11.7*  --   HGB 14.3 12.4 11.8*  HCT 42.0 36.5 35.9*  MCV  --  90.3 92.8  PLT  --  293 271   Cardiac Enzymes:  Recent Labs Lab 05/31/13 0035  TROPONINI <0.30   BNP (last 3 results)  Recent Labs  05/31/13 0035  PROBNP 189.6*   CBG:  Recent Labs Lab 06/01/13 1143 06/01/13 1804 06/01/13 2206 06/02/13 0746 06/02/13 1132  GLUCAP 103* 83 117* 95 114*    No results found for this or any previous visit (from the past 240 hour(s)).   Studies: Dg Chest 2 View  05/31/2013   CLINICAL DATA:  Cough, fever, congestion  EXAM: CHEST  2 VIEW  COMPARISON:  05/30/2013  FINDINGS: Cardiomediastinal silhouette is stable. No acute infiltrate or pleural effusion. No pulmonary edema. Bony thorax is stable.  IMPRESSION: No active cardiopulmonary disease.   Electronically Signed   By: Natasha Mead M.D.   On: 05/31/2013 17:48   Ct Angio Chest Pe W/cm &/or Wo Cm  06/01/2013   CLINICAL DATA:  Shortness of breath, chest  pain and elevated D-dimer.  EXAM: CT ANGIOGRAPHY CHEST WITH CONTRAST  TECHNIQUE: Multidetector CT imaging of the chest was performed using the standard protocol during bolus administration of intravenous contrast. Multiplanar CT image reconstructions including MIPs were obtained to evaluate the vascular anatomy.  CONTRAST:  OMNIPAQUE IOHEXOL 350 MG/ML SOLN  COMPARISON:  And PA and lateral chest 05/31/2013  FINDINGS: No pulmonary embolus is identified. There is cardiomegaly. Trace left pleural effusion is noted. No right pleural effusion or pericardial effusion is seen. A right hilar lymph node on image 33 measures 1.3 cm short axis dimension. Subcentimeter AP window lymph nodes are noted. The lungs demonstrate bilateral lower lobe airspace disease, worse on the left. Small focus of airspace opacity is also seen in the right upper lobe. Visualized upper abdomen demonstrates fatty infiltration of the liver. No focal abnormality upper abdomen is seen. No focal bony abnormality is identified.  Review of the MIP images confirms the above findings.  IMPRESSION: Negative for pulmonary embolus.  Left worse than right lower lobe airspace disease with small focus of airspace opacity in the right upper lobe has an appearance most compatible with pneumonia.  Cardiomegaly.  Fatty infiltration of the liver.   Electronically Signed   By: Drusilla Kanner M.D.   On: 06/01/2013 13:52    Scheduled Meds: . AEROCHAMBER PLUS FLO-VU LARGE  1 each Other Once  . aspirin  81 mg Oral Daily  . atorvastatin  40 mg Oral Daily  . budesonide (PULMICORT) nebulizer solution  0.25 mg Nebulization BID  . cholecalciferol  2,000 Units Oral Daily  . diltiazem  180 mg Oral Daily  . DULoxetine  20 mg Oral Daily  . enoxaparin (LOVENOX) injection  65 mg Subcutaneous Q24H  . ferrous fumarate  1 tablet Oral QODAY  . FLUoxetine  40 mg Oral Daily  . insulin aspart  0-9 Units Subcutaneous TID WC  . ipratropium  0.5 mg Nebulization TID  .  levalbuterol  0.63 mg Nebulization TID  . levofloxacin (LEVAQUIN) IV  750 mg Intravenous Q24H  . metoprolol succinate  200 mg Oral Daily  . pantoprazole  40 mg Oral Daily  . predniSONE  40 mg Oral Q breakfast  . sodium chloride  3 mL Intravenous Q12H  . sodium chloride  3 mL Intravenous Q12H   Continuous Infusions:     Time spent: 25 MINUTES    Eddie North  [facility] Pager [phone] If 7PM-7AM, please contact night-coverage at [email or web address], password [facility] 06/02/2013, 1:17 PM  LOS: 2 days

## 2013-06-03 LAB — GLUCOSE, CAPILLARY
Glucose-Capillary: 123 mg/dL — ABNORMAL HIGH (ref 70–99)
Glucose-Capillary: 119 mg/dL — ABNORMAL HIGH (ref 70–99)

## 2013-06-03 MED ORDER — ALBUTEROL SULFATE 0.63 MG/3ML IN NEBU: 1.0000 | INHALATION_SOLUTION | Freq: Four times a day (QID) | RESPIRATORY_TRACT | Status: DC | PRN

## 2013-06-03 MED ORDER — LEVOFLOXACIN 750 MG PO TABS: 750.0000 mg | ORAL_TABLET | Freq: Every day | ORAL | Status: DC

## 2013-06-03 MED ORDER — PREDNISONE (PAK) 10 MG PO TABS: ORAL_TABLET | Freq: Every day | ORAL | Status: DC

## 2013-06-03 MED ORDER — IPRATROPIUM BROMIDE 0.02 % IN SOLN: 0.5000 mg | Freq: Four times a day (QID) | RESPIRATORY_TRACT | Status: DC | PRN

## 2013-06-03 NOTE — Progress Notes (Signed)
06/03/2013 patient ambulate in hallway on room air saturation 93 to 94%. [facility] RN.

## 2013-06-03 NOTE — Care Management Note (Signed)
   CARE MANAGEMENT NOTE 06/03/2013  Patient:  Kathleen Good, Kathleen Good   Account Number:  [id number]  Date Initiated:  06/03/2013  Documentation initiated by:  Ezrie Bunyan  Subjective/Objective Assessment:   Request for assistance with medications and equipment.     Action/Plan:   Met with pt re d/c needs, pt has insurance and is unable to pay copays. CM will check with medication manufactures to see if pt may qualify for low income assistance.   Anticipated DC Date:  06/03/2013   Anticipated DC Plan:  HOME/SELF CARE         Choice offered to / List presented to:             Status of service:  Completed, signed off Medicare Important Message given?   (If response is "NO", the following Medicare IM given date fields will be blank) Date Medicare IM given:   Date Additional Medicare IM given:    Discharge Disposition:  HOME/SELF CARE  Per UR Regulation:    If discussed at Long Length of Stay Meetings, dates discussed:    Comments:  06/03/2013 Pt given applications, websites and phone numbers for current meds to apply for assistance due to low income status. Johny Shock RN MPH, case manager, [phone]

## 2013-06-03 NOTE — Discharge Summary (Signed)
Physician Discharge Summary  Kathleen Good QIO:[id number] DOB: [birth year] DOA: 05/31/2013  PCP: Eustaquio Boyden, MD  Admit date: 05/31/2013 Discharge date: 06/03/2013  Time spent: 30 minutes  Recommendations for Outpatient Follow-up:  1. Follow upwith PCP in one week 2. Follow up with pulmonology in one week.   Discharge Diagnoses:  Principal Problem:   CAP (community acquired pneumonia) Active Problems:   OSA (obstructive sleep apnea)   Influenza-like illness   SOB (shortness of breath)   Bronchitis, acute   Tachycardia   Discharge Condition: stable  Diet recommendation: regular  Filed Weights   05/31/13 2157 06/01/13 2206 06/02/13 2051  Weight: 130.092 kg (286 lb 12.8 oz) 113.807 kg (250 lb 14.4 oz) 128.05 kg (282 lb 4.8 oz)    History of present illness:  52 year old obese female with history of obstructive sleep apnea, hypertension, chronic tachycardia, hyperlipidemia, diabetes mellitus diet-controlled, tobacco abuse presented with worsening cough with shortness of breath for last 3-4 days subjective fevers and chills. She also had a chest tightness for 2 days. She had an episode of vomiting after prolonged bout of cough yesterday.  Patient was admitted for findings of acute bronchitis. She was noted to be tachycardic to 130s, with low grade temperature and elevated d dimer as well.    Hospital Course:  Shortness of breath / community acquired pneumonia. Possibly secondary to CAP/ acute bronchitis  Had chest tightness with elevated D-dimer. CT angio chest was negative for PE.  -Continue the course of levaquin , nebs and prednisone. Stable for discharge.  Chronic sinus tachycardia  Continue with metoprolol and Cardizem. TSH within normal limit. elevated d-dimer noted . CT chest ruled out for PE. HR stable.  Diabetes mellitus  Diet-controlled. Continue SSI  obstructive sleep apnea  Reports unable to afford CPAP . Cm consulted.      Procedures:  CXR  Consultations:  none  Discharge Exam: Filed Vitals:   06/03/13 1325  BP: 143/76  Pulse: 79  Temp: 98 F (36.7 C)  Resp: 20    General: alert afebrile comfortable Cardiovascular: s1s2 Respiratory: ctab  Discharge Instructions  Discharge Orders   Future Appointments Provider Department Dept Phone   09/01/2013 8:45 AM [facility] [place] [phone]   09/07/2013 8:30 AM Eustaquio Boyden, MD [facility] [phone]   Future Orders Complete By Expires   Discharge instructions  As directed    Comments:     Follow up with PCP in one week.       Medication List    STOP taking these medications       oseltamivir 75 MG capsule  Commonly known as:  TAMIFLU      TAKE these medications       AEROCHAMBER PLUS FLO-VU LARGE Misc  1 each by Other route once.     albuterol 108 (90 BASE) MCG/ACT inhaler  Commonly known as:  PROVENTIL HFA;VENTOLIN HFA  Inhale 2 puffs into the lungs every 6 (six) hours as needed for wheezing.     albuterol 108 (90 BASE) MCG/ACT inhaler  Commonly known as:  PROVENTIL HFA;VENTOLIN HFA  Inhale 2 puffs into the lungs every 4 (four) hours as needed for shortness of breath (or for cough).     albuterol 0.63 MG/3ML nebulizer solution  Commonly known as:  ACCUNEB  Take 3 mLs (0.63 mg total) by nebulization every 6 (six) hours as needed for wheezing.     aspirin 81 MG EC tablet  Take 81 mg  by mouth daily.     atorvastatin 40 MG tablet  Commonly known as:  LIPITOR  Take 1 tablet (40 mg total) by mouth daily.     benzonatate 200 MG capsule  Commonly known as:  TESSALON  Take 1 capsule (200 mg total) by mouth 3 (three) times daily as needed for cough.     cyanocobalamin 1000 MCG/ML injection  Commonly known as:  (VITAMIN B-12)  Inject 1 mL (1,000 mcg total) into the muscle once. Start 08/2012     cyclobenzaprine 10 MG tablet  Commonly known as:  FLEXERIL  Take 1  tablet (10 mg total) by mouth 2 (two) times daily as needed (migraines).     diltiazem 180 MG 24 hr capsule  Commonly known as:  CARDIZEM CD  TAKE ONE CAPSULE BY MOUTH ONE TIME DAILY     DULoxetine 20 MG capsule  Commonly known as:  CYMBALTA  Take 1 capsule (20 mg total) by mouth daily.     ferrous fumarate 325 (106 FE) MG Tabs tablet  Commonly known as:  HEMOCYTE - 106 mg FE  Take 1 tablet by mouth every other day.     FLUoxetine 40 MG capsule  Commonly known as:  PROZAC  Take one capsule by mouth one time daily     ipratropium 0.02 % nebulizer solution  Commonly known as:  ATROVENT  Take 2.5 mLs (0.5 mg total) by nebulization every 6 (six) hours as needed for wheezing or shortness of breath.     levofloxacin 750 MG tablet  Commonly known as:  LEVAQUIN  Take 1 tablet (750 mg total) by mouth daily.     metoprolol succinate 100 MG 24 hr tablet  Commonly known as:  TOPROL-XL  TAKE TWO TABLETS BY MOUTH DAILY  WITH FOOD     NEEDLE (DISP) 18 G 18G X 1" Misc  USE TO DRAW B12     NEEDLE (DISP) 25 G 25G X 1-1/2" Misc  USE TO ADMINISTER B12     omeprazole 20 MG capsule  Commonly known as:  PRILOSEC  TAKE ONE CAPSULE BY MOUTH TWICE DAILY     predniSONE 10 MG tablet  Commonly known as:  STERAPRED UNI-PAK  - Take by mouth daily. Prednisone 30 mg daily for 1 day followed by   - Prednisone 20 mg daily for 1 day followed by   - Prednisone 10 mg daily for 1 day and stop.     propranolol 20 MG tablet  Commonly known as:  INDERAL  Take 2 tablets (40 mg total) by mouth 3 (three) times daily as needed. For tachycardia     Syringe (Disposable) 3 ML Misc  USE TO ADMINISTER B12     traMADol 50 MG tablet  Commonly known as:  ULTRAM  Take 1 tablet (50 mg total) by mouth 2 (two) times daily as needed for pain.     triamcinolone ointment 0.5 %  Commonly known as:  KENALOG  Apply 1 application topically 2 (two) times daily. 2 times a day as needed     Vitamin D 2000 UNITS Caps   Take 1 capsule (2,000 Units total) by mouth daily.       Allergies  Allergen Reactions  . Codeine Sulfate     REACTION: causes something like heart palpitations  . Gabapentin Other (See Comments)    Visual hallucinations  . Hydrocodone-Guaifenesin     REACTION: heart palpitations  . Lyrica [Pregabalin] Other (See Comments)    oversedation  .  Metformin And Related Nausea Only    GI upset  . Penicillins     REACTION: Patient is allergic to all penicillin products  . Phenergan [Promethazine Hcl] Itching       Follow-up Information   Follow up with Eustaquio Boyden, MD. Schedule an appointment as soon as possible for a visit in 1 week.   Specialty:  Family Medicine   Contact information:   [address] [place] [address] [phone]        The results of significant diagnostics from this hospitalization (including imaging, microbiology, ancillary and laboratory) are listed below for reference.    Significant Diagnostic Studies: Dg Chest 2 View  05/31/2013   CLINICAL DATA:  Cough, fever, congestion  EXAM: CHEST  2 VIEW  COMPARISON:  05/30/2013  FINDINGS: Cardiomediastinal silhouette is stable. No acute infiltrate or pleural effusion. No pulmonary edema. Bony thorax is stable.  IMPRESSION: No active cardiopulmonary disease.   Electronically Signed   By: Natasha Mead M.D.   On: 05/31/2013 17:48   Dg Chest 2 View  05/30/2013   CLINICAL DATA:  Cough, congestion, fever  EXAM: CHEST  2 VIEW  COMPARISON:  01/ 22/11  FINDINGS: Cardiomediastinal silhouette is stable. No acute infiltrate or pulmonary edema. Metallic fixation plate cervical spine again noted. Bony thorax is stable.  IMPRESSION: No active cardiopulmonary disease.   Electronically Signed   By: Natasha Mead M.D.   On: 05/30/2013 16:09   Ct Angio Chest Pe W/cm &/or Wo Cm  06/01/2013   CLINICAL DATA:  Shortness of breath, chest pain and elevated D-dimer.  EXAM: CT ANGIOGRAPHY CHEST WITH CONTRAST  TECHNIQUE:  Multidetector CT imaging of the chest was performed using the standard protocol during bolus administration of intravenous contrast. Multiplanar CT image reconstructions including MIPs were obtained to evaluate the vascular anatomy.  CONTRAST:  OMNIPAQUE IOHEXOL 350 MG/ML SOLN  COMPARISON:  And PA and lateral chest 05/31/2013  FINDINGS: No pulmonary embolus is identified. There is cardiomegaly. Trace left pleural effusion is noted. No right pleural effusion or pericardial effusion is seen. A right hilar lymph node on image 33 measures 1.3 cm short axis dimension. Subcentimeter AP window lymph nodes are noted. The lungs demonstrate bilateral lower lobe airspace disease, worse on the left. Small focus of airspace opacity is also seen in the right upper lobe. Visualized upper abdomen demonstrates fatty infiltration of the liver. No focal abnormality upper abdomen is seen. No focal bony abnormality is identified.  Review of the MIP images confirms the above findings.  IMPRESSION: Negative for pulmonary embolus.  Left worse than right lower lobe airspace disease with small focus of airspace opacity in the right upper lobe has an appearance most compatible with pneumonia.  Cardiomegaly.  Fatty infiltration of the liver.   Electronically Signed   By: Drusilla Kanner M.D.   On: 06/01/2013 13:52    Microbiology: No results found for this or any previous visit (from the past 240 hour(s)).   Labs: Basic Metabolic Panel:  Recent Labs Lab 05/31/13 0035 05/31/13 1734 06/01/13 0602  NA  --  138 136  K  --  3.2* 4.8  CL  --  101 102  CO2  --   --  27  GLUCOSE  --  100* 100*  BUN  --  3* 5*  CREATININE 0.66 0.80 0.71  CALCIUM  --   --  8.4   Liver Function Tests: No results found for this basename: AST, ALT, ALKPHOS,  BILITOT, PROT, ALBUMIN,  in the last 168 hours No results found for this basename: LIPASE, AMYLASE,  in the last 168 hours No results found for this basename: AMMONIA,  in the last 168  hours CBC:  Recent Labs Lab 05/31/13 1734 05/31/13 1800 06/01/13 0602  WBC  --  15.7* 13.8*  NEUTROABS  --  11.7*  --   HGB 14.3 12.4 11.8*  HCT 42.0 36.5 35.9*  MCV  --  90.3 92.8  PLT  --  293 271   Cardiac Enzymes:  Recent Labs Lab 05/31/13 0035  TROPONINI <0.30   BNP: BNP (last 3 results)  Recent Labs  05/31/13 0035  PROBNP 189.6*   CBG:  Recent Labs Lab 06/02/13 1132 06/02/13 1704 06/02/13 2049 06/03/13 0805 06/03/13 1155  GLUCAP 114* 137* 187* 123* 119*       Signed:  Marce Schartz  [facility] 06/03/2013, 2:46 PM

## 2013-06-03 NOTE — Care Management Note (Signed)
   CARE MANAGEMENT NOTE 06/03/2013  Patient:  Kathleen Good, Kathleen Good   Account Number:  [id number]  Date Initiated:  06/03/2013  Documentation initiated by:  Jahking Lesser  Subjective/Objective Assessment:   Request for assistance with medications and equipment.     Action/Plan:   Met with pt re d/c needs, pt has insurance and is unable to pay copays. CM will check with medication manufactures to see if pt may qualify for low income assistance.   Anticipated DC Date:  06/03/2013   Anticipated DC Plan:  HOME/SELF CARE         Choice offered to / List presented to:             Status of service:  In process, will continue to follow Medicare Important Message given?   (If response is "NO", the following Medicare IM given date fields will be blank) Date Medicare IM given:   Date Additional Medicare IM given:    Discharge Disposition:    Per UR Regulation:    If discussed at Long Length of Stay Meetings, dates discussed:    Comments:

## 2013-06-05 NOTE — Telephone Encounter (Signed)
Pt discharged from hospital 06/03/2013 for CAP Can we call today for f/u phone call and schedule hospital f/u visit next week?  thanks

## 2013-06-09 ENCOUNTER — Ambulatory Visit: Payer: 59 | Admitting: Family Medicine

## 2013-06-12 ENCOUNTER — Ambulatory Visit (INDEPENDENT_AMBULATORY_CARE_PROVIDER_SITE_OTHER): Payer: 59 | Admitting: Family Medicine

## 2013-06-12 ENCOUNTER — Encounter: Payer: Self-pay | Admitting: Family Medicine

## 2013-06-12 VITALS — BP 118/86 | HR 64 | Temp 99.0°F | Wt 288.8 lb

## 2013-06-12 DIAGNOSIS — Z87891 Personal history of nicotine dependence: Secondary | ICD-10-CM

## 2013-06-12 DIAGNOSIS — E119 Type 2 diabetes mellitus without complications: Secondary | ICD-10-CM

## 2013-06-12 DIAGNOSIS — J189 Pneumonia, unspecified organism: Secondary | ICD-10-CM

## 2013-06-12 NOTE — Progress Notes (Signed)
Pre-visit discussion using our clinic review tool. No additional management support is needed unless otherwise documented below in the visit note.  

## 2013-06-12 NOTE — Progress Notes (Signed)
   Subjective:    Patient ID: Kathleen Good, female    DOB: 16-Feb-1961, 52 y.o.   MRN: [id number]  HPI CC: hospital follow up  Pt was recently admitted to [facility] with diagnosis of CAP, treated with levaquin course, prednisone course and nebulizer.  D dimer was elevated and CT angio was negative for PE but did show concern for possible RUL CAP.   Feeling much better but still has persistent weakness from deconditioning. Has not recently needed albuterol/atrovent nebulizer.  Has never had pneumonia in the past.  Has not had pneumovax. Hospital records reviewed.  Diet controlled diabetes - Doesn't check sugars at home. Ex smoker - 1 ppd for 15 yrs.  Quit 2 wks ago.  Has some cravings.  Stopped cold [place].  Feels will be able to abstain ?COPD - does not tend to get recurrent bronchitis infections.  Lab Results  Component Value Date   HGBA1C 6.8* 03/06/2013    Past Medical History  Diagnosis Date  . Depression   . GERD (gastroesophageal reflux disease)   . Hypertension   . Diabetes type 2, controlled   . Obesity   . Tobacco abuse   . Abdominal pain, unspecified site   . Chest pain, unspecified   . Diffuse cystic mastopathy   . Other specified disease of hair and hair follicles   . Calcaneal spur   . HLD (hyperlipidemia)   . Undiagnosed cardiac murmurs   . Personal history of colonic polyps   . Rash and other nonspecific skin eruption   . Tachycardia   . Persistent headaches     cervicogenic HA with migraine features, eval by Dr. Clarisse Gouge and others (5% permanent disability rating),  . DDD (degenerative disc disease), cervical     w/ occipital neuralgia, s/p bilateral upper cervical facet injections  . Colon polyp 11/2011    rpt due 5 yrs Leone Payor)  . History of acute mastitis 03/2012  . Vitamin B12 deficiency 09/08/2012  . Vitamin D deficiency 09/08/2012  . OSA (obstructive sleep apnea) 10/2012    severe, AHI 63, desat to 77% ([place])  . Asthma     Review of Systems Per  HPI    Objective:   Physical Exam  Nursing note and vitals reviewed. Constitutional: She appears well-developed and well-nourished. No distress.  HENT:  Mouth/Throat: Oropharynx is clear and moist. No oropharyngeal exudate.  Cardiovascular: Normal rate, regular rhythm, normal heart sounds and intact distal pulses.   No murmur heard. Pulmonary/Chest: Effort normal. No respiratory distress. She has wheezes. She has no rales.  Faint exp wheezing  Skin: Skin is warm and dry. No rash noted.  Psychiatric: She has a normal mood and affect.       Assessment & Plan:

## 2013-06-12 NOTE — Assessment & Plan Note (Signed)
Resolving well.  Anticipate continued improved breathing especially as has recently quit smoking. I advised she return in 3 months for pneumovax and spirometry to further evaluate for COPD in h/o smoking. (15 PY hx)

## 2013-06-12 NOTE — Assessment & Plan Note (Signed)
Congratulated, encourage continued abstinence. Check spirometry next visit to evaluate for COPD.

## 2013-06-12 NOTE — Patient Instructions (Signed)
Return in 3 months for spirometry and pneumonia shot. Good to see you today, call us with questions.

## 2013-06-12 NOTE — Telephone Encounter (Signed)
i'm seeing patient today.  F/u phone call necessary for transitional care visit was not completed.

## 2013-06-12 NOTE — Assessment & Plan Note (Signed)
Overall stable - consider recehck at next visit. Lab Results  Component Value Date   HGBA1C 6.8* 03/06/2013

## 2013-06-13 ENCOUNTER — Encounter: Payer: Self-pay | Admitting: Family Medicine

## 2013-07-13 ENCOUNTER — Other Ambulatory Visit: Payer: Self-pay | Admitting: *Deleted

## 2013-07-13 NOTE — Telephone Encounter (Signed)
Received faxed refill request from pharmacy. Last office visit 06/12/13. See medication warning Cardizem and Atorvastatin. Is it okay to refill medication?

## 2013-07-14 MED ORDER — DILTIAZEM HCL ER COATED BEADS 180 MG PO CP24: ORAL_CAPSULE | ORAL | Status: DC

## 2013-07-14 MED ORDER — FLUOXETINE HCL 40 MG PO CAPS: ORAL_CAPSULE | ORAL | Status: DC

## 2013-07-18 ENCOUNTER — Other Ambulatory Visit: Payer: Self-pay | Admitting: Family Medicine

## 2013-07-20 MED ORDER — OMEPRAZOLE 20 MG PO CPDR: DELAYED_RELEASE_CAPSULE | ORAL | Status: DC

## 2013-07-20 MED ORDER — TRIAMCINOLONE ACETONIDE 0.5 % EX OINT: 1.0000 "application " | TOPICAL_OINTMENT | Freq: Two times a day (BID) | CUTANEOUS | Status: DC

## 2013-07-26 LAB — HM DIABETES EYE EXAM

## 2013-09-01 ENCOUNTER — Other Ambulatory Visit: Payer: 59

## 2013-09-07 ENCOUNTER — Encounter: Payer: Self-pay | Admitting: Family Medicine

## 2013-09-07 ENCOUNTER — Ambulatory Visit (INDEPENDENT_AMBULATORY_CARE_PROVIDER_SITE_OTHER): Payer: 59 | Admitting: Family Medicine

## 2013-09-07 VITALS — BP 134/84 | HR 88 | Temp 98.8°F | Wt 296.8 lb

## 2013-09-07 DIAGNOSIS — R0602 Shortness of breath: Secondary | ICD-10-CM

## 2013-09-07 DIAGNOSIS — G4733 Obstructive sleep apnea (adult) (pediatric): Secondary | ICD-10-CM

## 2013-09-07 DIAGNOSIS — Z23 Encounter for immunization: Secondary | ICD-10-CM

## 2013-09-07 DIAGNOSIS — J449 Chronic obstructive pulmonary disease, unspecified: Secondary | ICD-10-CM

## 2013-09-07 DIAGNOSIS — E669 Obesity, unspecified: Secondary | ICD-10-CM

## 2013-09-07 MED ORDER — ALBUTEROL SULFATE (2.5 MG/3ML) 0.083% IN NEBU
2.5000 mg | INHALATION_SOLUTION | Freq: Once | RESPIRATORY_TRACT | Status: AC
  Administered 2013-09-07: 2.5 mg via RESPIRATORY_TRACT

## 2013-09-07 NOTE — Assessment & Plan Note (Signed)
Severe OSA. Unable to afford CPAP.

## 2013-09-07 NOTE — Progress Notes (Signed)
Pre visit review using our clinic review tool, if applicable. No additional management support is needed unless otherwise documented below in the visit note. 

## 2013-09-07 NOTE — Assessment & Plan Note (Signed)
Anticipate leading to dyspnea and deconditioning. Discussed importance of weight loss. Pt interested in learning more about bariatric surgery and I think would be good candidate for this - info on bariatric information seminar provided today. Body mass index is 47.92 kg/(m^2).

## 2013-09-07 NOTE — Assessment & Plan Note (Signed)
Attributable to obesity.  Mild restriction on spirometry today.  No evidence of asthma or COPD. Encouraged continued smoking cessation.

## 2013-09-07 NOTE — Progress Notes (Signed)
BP 134/84  Pulse 88  Temp(Src) 98.8 F (37.1 C) (Oral)  Wt 296 lb 12 oz (134.605 kg)  LMP 08/12/2013   CC: spirometry  Subjective:    Patient ID: Kathleen Good, female    DOB: [birth year], 53 y.o.   MRN: [id number]  HPI: Kathleen Good is a 53 y.o. female presenting on 09/07/2013 for Follow-up   Recent hospitalization for CAP treated with levaquin.  Feeling better.  1 day last week had some chest tightness with dyspnea and wheeze that improved with breathing treatment.  Presents today for pneumovax as well as spirometry.  15 PY history, quit early this year.  Doing well abstaining.  Few cravings for cigarettes.  Spirometry today - Mild restriction. Post bronchodilator test not clearly improved.  FVC 79%, FEV1 80%, ratio 0.8.  No evidence of COPD.  Obesity - very easily short winded with minimal exertion - ie walking dog down street.  She does walk 5 dogs regularly.  Wt Readings from Last 3 Encounters:  09/07/13 296 lb 12 oz (134.605 kg)  06/12/13 288 lb 12 oz (130.976 kg)  06/02/13 282 lb 4.8 oz (128.05 kg)  Body mass index is 47.92 kg/(m^2).  Untreated OSA - was unable to afford CPAP despite looking into assistance options.  Stays fatigued.  Non restful sleep.  $350.  Relevant past medical, surgical, family and social history reviewed and updated as indicated.  Allergies and medications reviewed and updated. Current Outpatient Prescriptions on File Prior to Visit  Medication Sig  . albuterol (ACCUNEB) 0.63 MG/3ML nebulizer solution Take 3 mLs (0.63 mg total) by nebulization every 6 (six) hours as needed for wheezing.  Marland Kitchen albuterol (PROVENTIL HFA;VENTOLIN HFA) 108 (90 BASE) MCG/ACT inhaler Inhale 2 puffs into the lungs every 4 (four) hours as needed for shortness of breath (or for cough).  Marland Kitchen aspirin 81 MG EC tablet Take 81 mg by mouth daily.    Marland Kitchen atorvastatin (LIPITOR) 40 MG tablet Take 1 tablet (40 mg total) by mouth daily.  . Cholecalciferol (VITAMIN D) 2000 UNITS CAPS Take 1  capsule (2,000 Units total) by mouth daily.  . cyanocobalamin (,VITAMIN B-12,) 1000 MCG/ML injection Inject 1 mL (1,000 mcg total) into the muscle once. Start 08/2012  . cyclobenzaprine (FLEXERIL) 10 MG tablet Take 1 tablet (10 mg total) by mouth 2 (two) times daily as needed (migraines).  Marland Kitchen diltiazem (CARDIZEM CD) 180 MG 24 hr capsule TAKE ONE CAPSULE BY MOUTH ONE TIME DAILY  . DULoxetine (CYMBALTA) 20 MG capsule Take 1 capsule (20 mg total) by mouth daily.  . ferrous fumarate (HEMOCYTE - 106 MG FE) 325 (106 FE) MG TABS Take 1 tablet by mouth every other day.   Marland Kitchen FLUoxetine (PROZAC) 40 MG capsule Take one capsule by mouth one time daily  . ipratropium (ATROVENT) 0.02 % nebulizer solution Take 2.5 mLs (0.5 mg total) by nebulization every 6 (six) hours as needed for wheezing or shortness of breath.  . metoprolol succinate (TOPROL-XL) 100 MG 24 hr tablet TAKE TWO TABLETS BY MOUTH DAILY  WITH FOOD  . NEEDLE, DISP, 18 G 18G X 1" MISC USE TO DRAW B12  . NEEDLE, DISP, 25 G 25G X 1-1/2" MISC USE TO ADMINISTER B12  . omeprazole (PRILOSEC) 20 MG capsule TAKE ONE CAPSULE BY MOUTH TWICE DAILY  . propranolol (INDERAL) 20 MG tablet Take 2 tablets (40 mg total) by mouth 3 (three) times daily as needed. For tachycardia  . Spacer/Aero-Holding Chambers (AEROCHAMBER PLUS FLO-VU LARGE) MISC 1  each by Other route once.  . Syringe, Disposable, 3 ML MISC USE TO ADMINISTER B12  . traMADol (ULTRAM) 50 MG tablet Take 1 tablet (50 mg total) by mouth 2 (two) times daily as needed for pain.  Marland Kitchen triamcinolone ointment (KENALOG) 0.5 % Apply 1 application topically 2 (two) times daily. 2 times a day as needed  . [DISCONTINUED] metFORMIN (GLUCOPHAGE) 500 MG tablet Take 1 tablet (500 mg total) by mouth 2 (two) times daily with a meal.   No current facility-administered medications on file prior to visit.    Review of Systems Per HPI unless specifically indicated above    Objective:    BP 134/84  Pulse 88  Temp(Src) 98.8  F (37.1 C) (Oral)  Wt 296 lb 12 oz (134.605 kg)  LMP 08/12/2013  Physical Exam  Nursing note and vitals reviewed. Constitutional: She appears well-developed and well-nourished. No distress.  HENT:  Head: Normocephalic and atraumatic.  Mouth/Throat: Oropharynx is clear and moist. No oropharyngeal exudate.  Cardiovascular: Normal rate, regular rhythm, normal heart sounds and intact distal pulses.   No murmur heard. Distant heart sounds  Pulmonary/Chest: Effort normal and breath sounds normal. No respiratory distress. She has no wheezes. She has no rales.       Assessment & Plan:   Problem List Items Addressed This Visit   DYSPNEA     Attributable to obesity.  Mild restriction on spirometry today.  No evidence of asthma or COPD. Encouraged continued smoking cessation.    Obesity     Anticipate leading to dyspnea and deconditioning. Discussed importance of weight loss. Pt interested in learning more about bariatric surgery and I think would be good candidate for this - info on bariatric information seminar provided today. Body mass index is 47.92 kg/(m^2).     OSA (obstructive sleep apnea)     Severe OSA. Unable to afford CPAP.     Other Visit Diagnoses   COPD (chronic obstructive pulmonary disease)    -  Primary    Relevant Medications       albuterol (PROVENTIL) (2.5 MG/3ML) 0.083% nebulizer solution 2.5 mg (Completed)    Other Relevant Orders       Spirometry with Graph (Completed)        Follow up plan: Return in about 6 months (around 03/10/2014), or if symptoms worsen or fail to improve, for follow up visit or physical if due.

## 2013-09-07 NOTE — Patient Instructions (Signed)
Lungs are looking good today - no changes. Pneumonia shot today. Call [phone] for info on free bariatric surgery seminar.

## 2013-09-07 NOTE — Addendum Note (Signed)
Addended by: Royann Shivers A on: 09/07/2013 09:32 AM   Modules accepted: Orders

## 2013-09-08 ENCOUNTER — Telehealth: Payer: Self-pay | Admitting: Family Medicine

## 2013-09-08 NOTE — Telephone Encounter (Signed)
Relevant patient education assigned to patient using Emmi. ° °

## 2013-10-01 ENCOUNTER — Other Ambulatory Visit: Payer: Self-pay

## 2014-01-02 ENCOUNTER — Other Ambulatory Visit: Payer: Self-pay | Admitting: Family Medicine

## 2014-01-04 MED ORDER — TRIAMCINOLONE ACETONIDE 0.5 % EX OINT: 1.0000 "application " | TOPICAL_OINTMENT | Freq: Two times a day (BID) | CUTANEOUS | Status: DC

## 2014-01-04 MED ORDER — CYCLOBENZAPRINE HCL 10 MG PO TABS: 10.0000 mg | ORAL_TABLET | Freq: Three times a day (TID) | ORAL | Status: DC | PRN

## 2014-01-04 MED ORDER — TRAMADOL HCL 50 MG PO TABS: 50.0000 mg | ORAL_TABLET | Freq: Two times a day (BID) | ORAL | Status: DC | PRN

## 2014-01-04 NOTE — Telephone Encounter (Signed)
plz phone in tramadol to appropriate pharmacy

## 2014-01-04 NOTE — Telephone Encounter (Signed)
Rx called in as directed.   

## 2014-02-19 ENCOUNTER — Other Ambulatory Visit: Payer: Self-pay | Admitting: Family Medicine

## 2014-02-19 ENCOUNTER — Telehealth: Payer: Self-pay | Admitting: Family Medicine

## 2014-02-19 MED ORDER — OMEPRAZOLE 20 MG PO CPDR: DELAYED_RELEASE_CAPSULE | ORAL | Status: DC

## 2014-02-19 MED ORDER — METOPROLOL SUCCINATE ER 100 MG PO TB24: ORAL_TABLET | ORAL | Status: DC

## 2014-02-19 MED ORDER — DILTIAZEM HCL ER COATED BEADS 180 MG PO CP24: ORAL_CAPSULE | ORAL | Status: DC

## 2014-02-19 NOTE — Telephone Encounter (Signed)
Received a faxed refill request from [facility] for Omeprazole 20 mg BID, I refilled medication electronically.

## 2014-04-09 ENCOUNTER — Other Ambulatory Visit: Payer: Self-pay

## 2014-06-25 DIAGNOSIS — M5136 Other intervertebral disc degeneration, lumbar region: Secondary | ICD-10-CM

## 2014-06-25 DIAGNOSIS — M51369 Other intervertebral disc degeneration, lumbar region without mention of lumbar back pain or lower extremity pain: Secondary | ICD-10-CM

## 2014-06-25 HISTORY — DX: Other intervertebral disc degeneration, lumbar region without mention of lumbar back pain or lower extremity pain: M51.369

## 2014-06-25 HISTORY — DX: Other intervertebral disc degeneration, lumbar region: M51.36

## 2014-06-26 ENCOUNTER — Other Ambulatory Visit: Payer: Self-pay | Admitting: Family Medicine

## 2014-06-27 ENCOUNTER — Other Ambulatory Visit: Payer: Self-pay | Admitting: Family Medicine

## 2014-06-28 ENCOUNTER — Other Ambulatory Visit: Payer: Self-pay | Admitting: *Deleted

## 2014-06-28 MED ORDER — DILTIAZEM HCL ER COATED BEADS 180 MG PO CP24: ORAL_CAPSULE | ORAL | Status: DC

## 2014-07-07 ENCOUNTER — Other Ambulatory Visit: Payer: Self-pay | Admitting: Family Medicine

## 2014-07-07 DIAGNOSIS — E669 Obesity, unspecified: Secondary | ICD-10-CM

## 2014-07-07 DIAGNOSIS — E559 Vitamin D deficiency, unspecified: Secondary | ICD-10-CM

## 2014-07-07 DIAGNOSIS — E538 Deficiency of other specified B group vitamins: Secondary | ICD-10-CM

## 2014-07-07 DIAGNOSIS — E785 Hyperlipidemia, unspecified: Secondary | ICD-10-CM

## 2014-07-07 DIAGNOSIS — D649 Anemia, unspecified: Secondary | ICD-10-CM

## 2014-07-07 DIAGNOSIS — I10 Essential (primary) hypertension: Secondary | ICD-10-CM

## 2014-07-07 DIAGNOSIS — E119 Type 2 diabetes mellitus without complications: Secondary | ICD-10-CM

## 2014-07-08 ENCOUNTER — Other Ambulatory Visit: Payer: Self-pay

## 2014-07-21 ENCOUNTER — Encounter: Payer: Self-pay | Admitting: Family Medicine

## 2014-08-20 ENCOUNTER — Other Ambulatory Visit: Payer: Self-pay | Admitting: Family Medicine

## 2014-08-24 ENCOUNTER — Encounter: Payer: Self-pay | Admitting: Family Medicine

## 2014-08-24 ENCOUNTER — Telehealth: Payer: Self-pay

## 2014-08-24 NOTE — Telephone Encounter (Signed)
Pt left v/m; pt is scheduled 08/26/14 for 6 teeth to be extracted (most of teeth are to be cut out). Dentist will only suggest Tylenol or Advil. Pt wants to know if Dr Darnell Level would prescribe small quantity of Demerol or Dilaudid (only med pt can take without effecting pts heart rate). Pt request cb.pt has CPX scheduled with Dr Darnell Level 09/02/14.

## 2014-08-25 MED ORDER — OXYCODONE-ACETAMINOPHEN 5-325 MG PO TABS: 1.0000 | ORAL_TABLET | Freq: Three times a day (TID) | ORAL | Status: DC | PRN

## 2014-08-25 NOTE — Telephone Encounter (Signed)
Spoke with patient and she said the percocet makes her heart race as well. She is begging for the demerol or dilaudid, but will use the percocet if absolutely necessary. Her procedure is at 11:00 am tomorrow and would like to pick up the Rx prior to that if possible.

## 2014-08-25 NOTE — Telephone Encounter (Signed)
We could percocets - is pt willing to try this? Printed and in Kim's box.

## 2014-08-25 NOTE — Telephone Encounter (Signed)
Don't recommend demerol (I don't think it's available anymore) May do dilaudid but will have to wait until 10am when I arrive. Shred percocet prescription.

## 2014-08-26 MED ORDER — HYDROMORPHONE HCL 2 MG PO TABS: 2.0000 mg | ORAL_TABLET | Freq: Three times a day (TID) | ORAL | Status: DC | PRN

## 2014-08-26 NOTE — Telephone Encounter (Signed)
Printed and in Kim's box 

## 2014-08-26 NOTE — Telephone Encounter (Signed)
Patient notified and Rx placed up front for pick up. Percocet Rx shredded.

## 2014-08-30 ENCOUNTER — Other Ambulatory Visit (INDEPENDENT_AMBULATORY_CARE_PROVIDER_SITE_OTHER): Payer: 59

## 2014-08-30 ENCOUNTER — Encounter: Payer: Self-pay | Admitting: Radiology

## 2014-08-30 DIAGNOSIS — E538 Deficiency of other specified B group vitamins: Secondary | ICD-10-CM

## 2014-08-30 DIAGNOSIS — I10 Essential (primary) hypertension: Secondary | ICD-10-CM

## 2014-08-30 DIAGNOSIS — E559 Vitamin D deficiency, unspecified: Secondary | ICD-10-CM

## 2014-08-30 DIAGNOSIS — E785 Hyperlipidemia, unspecified: Secondary | ICD-10-CM

## 2014-08-30 DIAGNOSIS — D649 Anemia, unspecified: Secondary | ICD-10-CM

## 2014-08-30 DIAGNOSIS — E119 Type 2 diabetes mellitus without complications: Secondary | ICD-10-CM

## 2014-08-30 LAB — COMPREHENSIVE METABOLIC PANEL
ALT: 19 U/L (ref 0–35)
AST: 20 U/L (ref 0–37)
Albumin: 3.9 g/dL (ref 3.5–5.2)
Alkaline Phosphatase: 72 U/L (ref 39–117)
BUN: 9 mg/dL (ref 6–23)
CO2: 27 mEq/L (ref 19–32)
Calcium: 8.7 mg/dL (ref 8.4–10.5)
Chloride: 101 mEq/L (ref 96–112)
Creatinine, Ser: 0.83 mg/dL (ref 0.40–1.20)
GFR: 76.83 mL/min (ref >60.00)
Glucose, Bld: 118 mg/dL — ABNORMAL HIGH (ref 70–99)
Potassium: 3.5 mEq/L (ref 3.5–5.1)
Sodium: 136 mEq/L (ref 135–145)
Total Bilirubin: 0.5 mg/dL (ref 0.2–1.2)
Total Protein: 7.5 g/dL (ref 6.0–8.3)

## 2014-08-30 LAB — CBC WITH DIFFERENTIAL/PLATELET
Basophils Absolute: 0 10*3/uL (ref 0.0–0.1)
Basophils Relative: 0.3 % (ref 0.0–3.0)
Eosinophils Absolute: 0.2 10*3/uL (ref 0.0–0.7)
Eosinophils Relative: 2.5 % (ref 0.0–5.0)
HCT: 36.1 % (ref 36.0–46.0)
Hemoglobin: 12.1 g/dL (ref 12.0–15.0)
Lymphocytes Relative: 30.7 % (ref 12.0–46.0)
Lymphs Abs: 2.1 10*3/uL (ref 0.7–4.0)
MCHC: 33.6 g/dL (ref 30.0–36.0)
MCV: 86.6 fl (ref 78.0–100.0)
Monocytes Absolute: 0.6 10*3/uL (ref 0.1–1.0)
Monocytes Relative: 9.4 % (ref 3.0–12.0)
Neutro Abs: 3.9 10*3/uL (ref 1.4–7.7)
Neutrophils Relative %: 57.1 % (ref 43.0–77.0)
Platelets: 327 10*3/uL (ref 150.0–400.0)
RBC: 4.17 Mil/uL (ref 3.87–5.11)
RDW: 14.3 % (ref 11.5–15.5)
WBC: 6.8 10*3/uL (ref 4.0–10.5)

## 2014-08-30 LAB — TSH: TSH: 2.34 u[IU]/mL (ref 0.35–4.50)

## 2014-08-30 LAB — MICROALBUMIN / CREATININE URINE RATIO
Creatinine,U: 242.3 mg/dL
Microalb Creat Ratio: 2 mg/g (ref 0.0–30.0)
Microalb, Ur: 4.9 mg/dL — ABNORMAL HIGH (ref 0.0–1.9)

## 2014-08-30 LAB — HEMOGLOBIN A1C: Hgb A1c MFr Bld: 7.1 % — ABNORMAL HIGH (ref 4.6–6.5)

## 2014-08-30 LAB — LIPID PANEL
Cholesterol: 265 mg/dL — ABNORMAL HIGH (ref 0–200)
HDL: 37.3 mg/dL — ABNORMAL LOW (ref >39.00)
NonHDL: 227.7
Total CHOL/HDL Ratio: 7
Triglycerides: 209 mg/dL — ABNORMAL HIGH (ref 0.0–149.0)
VLDL: 41.8 mg/dL — ABNORMAL HIGH (ref 0.0–40.0)

## 2014-08-30 LAB — LDL CHOLESTEROL, DIRECT: Direct LDL: 198 mg/dL

## 2014-08-30 LAB — FERRITIN: Ferritin: 25.3 ng/mL (ref 10.0–291.0)

## 2014-08-30 LAB — VITAMIN D 25 HYDROXY (VIT D DEFICIENCY, FRACTURES): VITD: 20.29 ng/mL — ABNORMAL LOW (ref 30.00–100.00)

## 2014-08-30 LAB — VITAMIN B12: Vitamin B-12: 301 pg/mL (ref 211–911)

## 2014-09-02 ENCOUNTER — Other Ambulatory Visit (HOSPITAL_COMMUNITY)
Admission: RE | Admit: 2014-09-02 | Discharge: 2014-09-02 | Disposition: A | Discharge status: Home / Self Care | Payer: 59 | Source: Ambulatory Visit | Attending: Family Medicine | Admitting: Family Medicine

## 2014-09-02 ENCOUNTER — Ambulatory Visit (INDEPENDENT_AMBULATORY_CARE_PROVIDER_SITE_OTHER): Payer: 59 | Admitting: Family Medicine

## 2014-09-02 ENCOUNTER — Encounter: Payer: Self-pay | Admitting: Family Medicine

## 2014-09-02 VITALS — BP 130/80 | HR 72 | Temp 98.6°F | Ht 65.0 in | Wt 304.0 lb

## 2014-09-02 DIAGNOSIS — R079 Chest pain, unspecified: Secondary | ICD-10-CM

## 2014-09-02 DIAGNOSIS — Z01411 Encounter for gynecological examination (general) (routine) with abnormal findings: Secondary | ICD-10-CM | POA: Insufficient documentation

## 2014-09-02 DIAGNOSIS — E785 Hyperlipidemia, unspecified: Secondary | ICD-10-CM

## 2014-09-02 DIAGNOSIS — E538 Deficiency of other specified B group vitamins: Secondary | ICD-10-CM

## 2014-09-02 DIAGNOSIS — E669 Obesity, unspecified: Secondary | ICD-10-CM

## 2014-09-02 DIAGNOSIS — IMO0002 Reserved for concepts with insufficient information to code with codable children: Secondary | ICD-10-CM

## 2014-09-02 DIAGNOSIS — I10 Essential (primary) hypertension: Secondary | ICD-10-CM

## 2014-09-02 DIAGNOSIS — E1165 Type 2 diabetes mellitus with hyperglycemia: Secondary | ICD-10-CM

## 2014-09-02 DIAGNOSIS — Z87891 Personal history of nicotine dependence: Secondary | ICD-10-CM

## 2014-09-02 DIAGNOSIS — Z0000 Encounter for general adult medical examination without abnormal findings: Secondary | ICD-10-CM

## 2014-09-02 DIAGNOSIS — Z124 Encounter for screening for malignant neoplasm of cervix: Secondary | ICD-10-CM

## 2014-09-02 DIAGNOSIS — F329 Major depressive disorder, single episode, unspecified: Secondary | ICD-10-CM

## 2014-09-02 DIAGNOSIS — Z1151 Encounter for screening for human papillomavirus (HPV): Secondary | ICD-10-CM | POA: Diagnosis present

## 2014-09-02 DIAGNOSIS — F32A Depression, unspecified: Secondary | ICD-10-CM

## 2014-09-02 DIAGNOSIS — E559 Vitamin D deficiency, unspecified: Secondary | ICD-10-CM

## 2014-09-02 MED ORDER — NITROGLYCERIN 0.4 MG SL SUBL: 0.4000 mg | SUBLINGUAL_TABLET | SUBLINGUAL | Status: DC | PRN

## 2014-09-02 MED ORDER — SITAGLIPTIN PHOSPHATE 50 MG PO TABS: 50.0000 mg | ORAL_TABLET | Freq: Every day | ORAL | Status: DC

## 2014-09-02 MED ORDER — VITAMIN B-12 1000 MCG PO TABS: 1000.0000 ug | ORAL_TABLET | Freq: Every day | ORAL | Status: DC

## 2014-09-02 MED ORDER — CHOLECALCIFEROL 25 MCG (1000 UT) PO CAPS: 1000.0000 [IU] | ORAL_CAPSULE | Freq: Every day | ORAL | Status: DC

## 2014-09-02 MED ORDER — ATORVASTATIN CALCIUM 40 MG PO TABS: 40.0000 mg | ORAL_TABLET | Freq: Every day | ORAL | Status: DC

## 2014-09-02 NOTE — Progress Notes (Signed)
BP 130/80 mmHg  Pulse 72  Temp(Src) 98.6 F (37 C) (Oral)  Ht 5\' 5"  (1.651 m)  Wt 304 lb (137.893 kg)  BMI 50.59 kg/m2  LMP 05/21/2014   CC: CPE  Subjective:    Patient ID: Kathleen Good, female    DOB: Nov 08, 1960, 54 y.o.   MRN: [id number]  HPI: Kathleen Good is a 54 y.o. female presenting on 09/02/2014 for Annual Exam   HLD - ran out of lipitor - has not taken in >1 yr.  Not on vit D or B12. Did do 1 year worth of B12 shots. fmhx colon cancer and CAD (dad). On aspirin 81mg  daily. Quit smoking 05/2013! No cravings.   Activity limited by exertional chest tightness. Due to f/u with Dr Rockey Situ.   Preventative: Well woman exams here, none in several years. Never had abnl pap, has had 3 normals in past. LMP 04/2014. Getting irregular. Significant hot flashes and night sweats for last 10 yrs.  Last mammogram 04/2012 normal. Due for rpt.  COLONOSCOPY Date: 12/11/2011 hyperplastic polyp, rpt 5 yrs given fmhx Gatha Mayer) Flu - declines Tdap - done 11/2010, at ER. Pneumovax - 08/2013 Seat belt use discussed Sunscreen use discussed. No suspicious moles  Lives with husband and cats and dogs Occupation: housewife Activity: walk dogs but limited by chest discomfort Diet: good water, some fruits/vegetables  Relevant past medical, surgical, family and social history reviewed and updated as indicated. Interim medical history since our last visit reviewed. Allergies and medications reviewed and updated. Current Outpatient Prescriptions on File Prior to Visit  Medication Sig  . aspirin 81 MG EC tablet Take 81 mg by mouth daily.    . cyclobenzaprine (FLEXERIL) 10 MG tablet Take 1 tablet (10 mg total) by mouth 3 (three) times daily as needed for muscle spasms.  Marland Kitchen diltiazem (CARDIZEM CD) 180 MG 24 hr capsule TAKE ONE CAPSULE BY MOUTH ONE TIME DAILY  . FLUoxetine (PROZAC) 40 MG capsule TAKE 1 CAPSULE BY MOUTH ONCE A DAY  . metoprolol succinate (TOPROL-XL) 100 MG 24 hr tablet TAKE TWO  TABLETS DAILY WITH FOOD  . omeprazole (PRILOSEC) 20 MG capsule TAKE ONE CAPSULE BY MOUTH TWICE DAILY  . triamcinolone ointment (KENALOG) 0.5 % Apply 1 application topically 2 (two) times daily. 2 times a day as needed  . albuterol (ACCUNEB) 0.63 MG/3ML nebulizer solution Take 3 mLs (0.63 mg total) by nebulization every 6 (six) hours as needed for wheezing. (Patient not taking: Reported on 09/02/2014)  . albuterol (PROVENTIL HFA;VENTOLIN HFA) 108 (90 BASE) MCG/ACT inhaler Inhale 2 puffs into the lungs every 4 (four) hours as needed for shortness of breath (or for cough). (Patient not taking: Reported on 09/02/2014)  . Spacer/Aero-Holding Chambers (AEROCHAMBER PLUS FLO-VU LARGE) MISC 1 each by Other route once. (Patient not taking: Reported on 09/02/2014)  . [DISCONTINUED] metFORMIN (GLUCOPHAGE) 500 MG tablet Take 1 tablet (500 mg total) by mouth 2 (two) times daily with a meal.   No current facility-administered medications on file prior to visit.    Review of Systems  Constitutional: Negative for fever, chills, activity change, appetite change, fatigue and unexpected weight change.  HENT: Negative for hearing loss.   Eyes: Negative for visual disturbance.  Respiratory: Positive for chest tightness (occasional). Negative for cough, shortness of breath and wheezing.   Cardiovascular: Positive for leg swelling. Negative for chest pain and palpitations.  Gastrointestinal: Negative for nausea, vomiting, abdominal pain, diarrhea, constipation, blood in stool and abdominal distention.  Genitourinary:  Negative for hematuria and difficulty urinating.  Musculoskeletal: Negative for myalgias, arthralgias and neck pain.  Skin: Negative for rash.  Neurological: Positive for dizziness and headaches (occasional). Negative for seizures and syncope.  Hematological: Negative for adenopathy. Does not bruise/bleed easily.  Psychiatric/Behavioral: Negative for dysphoric mood. The patient is not nervous/anxious.     Per HPI unless specifically indicated above     Objective:    BP 130/80 mmHg  Pulse 72  Temp(Src) 98.6 F (37 C) (Oral)  Ht 5\' 5"  (1.651 m)  Wt 304 lb (137.893 kg)  BMI 50.59 kg/m2  LMP 05/21/2014  Wt Readings from Last 3 Encounters:  09/02/14 304 lb (137.893 kg)  09/07/13 296 lb 12 oz (134.605 kg)  06/12/13 288 lb 12 oz (130.976 kg)    Physical Exam  Constitutional: She is oriented to person, place, and time. She appears well-developed and well-nourished. No distress.  HENT:  Head: Normocephalic and atraumatic.  Right Ear: Hearing, tympanic membrane, external ear and ear canal normal.  Left Ear: Hearing, tympanic membrane, external ear and ear canal normal.  Nose: Nose normal.  Mouth/Throat: Uvula is midline, oropharynx is clear and moist and mucous membranes are normal. No oropharyngeal exudate, posterior oropharyngeal edema or posterior oropharyngeal erythema.  Eyes: Conjunctivae and EOM are normal. Pupils are equal, round, and reactive to light. No scleral icterus.  Neck: Normal range of motion. Neck supple. No thyromegaly present.  Cardiovascular: Normal rate, regular rhythm, normal heart sounds and intact distal pulses.   No murmur heard. Pulses:      Radial pulses are 2+ on the right side, and 2+ on the left side.  Pulmonary/Chest: Effort normal and breath sounds normal. No respiratory distress. She has no wheezes. She has no rales. Right breast exhibits no inverted nipple, no mass, no nipple discharge, no skin change and no tenderness. Left breast exhibits no inverted nipple, no mass, no nipple discharge, no skin change and no tenderness.  Abdominal: Soft. Bowel sounds are normal. She exhibits no distension and no mass. There is no tenderness. There is no rebound and no guarding.  Genitourinary: Vagina normal and uterus normal. Pelvic exam was performed with patient supine. There is no rash, tenderness, lesion or injury on the right labia. There is no rash, tenderness,  lesion or injury on the left labia. Cervix exhibits no motion tenderness, no discharge and no friability. Right adnexum displays no mass, no tenderness and no fullness. Left adnexum displays no mass, no tenderness and no fullness.  Musculoskeletal: Normal range of motion. She exhibits no edema.  Lymphadenopathy:       Head (right side): No submental, no submandibular, no tonsillar, no preauricular and no posterior auricular adenopathy present.       Head (left side): No submental, no submandibular, no tonsillar, no preauricular and no posterior auricular adenopathy present.    She has no cervical adenopathy.    She has no axillary adenopathy.       Right axillary: No lateral adenopathy present.       Left axillary: No lateral adenopathy present.      Right: No supraclavicular adenopathy present.       Left: No supraclavicular adenopathy present.  Neurological: She is alert and oriented to person, place, and time.  CN grossly intact, station and gait intact  Skin: Skin is warm and dry. No rash noted.  Psychiatric: She has a normal mood and affect. Her behavior is normal. Judgment and thought content normal.  Nursing note and vitals  reviewed.  Results for orders placed or performed in visit on 08/30/14  Ferritin  Result Value Ref Range   Ferritin 25.3 10.0 - 291.0 ng/mL  Vitamin B12  Result Value Ref Range   Vitamin B-12 301 211 - 911 pg/mL  Vit D  25 hydroxy (rtn osteoporosis monitoring)  Result Value Ref Range   VITD 20.29 (L) 30.00 - 100.00 ng/mL  Lipid panel  Result Value Ref Range   Cholesterol 265 (H) 0 - 200 mg/dL   Triglycerides 209.0 (H) 0.0 - 149.0 mg/dL   HDL 37.30 (L) >39.00 mg/dL   VLDL 41.8 (H) 0.0 - 40.0 mg/dL   Total CHOL/HDL Ratio 7    NonHDL 227.70   TSH  Result Value Ref Range   TSH 2.34 0.35 - 4.50 uIU/mL  Hemoglobin A1c  Result Value Ref Range   Hgb A1c MFr Bld 7.1 (H) 4.6 - 6.5 %  CBC with Differential  Result Value Ref Range   WBC 6.8 4.0 - 10.5 K/uL    RBC 4.17 3.87 - 5.11 Mil/uL   Hemoglobin 12.1 12.0 - 15.0 g/dL   HCT 36.1 36.0 - 46.0 %   MCV 86.6 78.0 - 100.0 fl   MCHC 33.6 30.0 - 36.0 g/dL   RDW 14.3 11.5 - 15.5 %   Platelets 327.0 150.0 - 400.0 K/uL   Neutrophils Relative % 57.1 43.0 - 77.0 %   Lymphocytes Relative 30.7 12.0 - 46.0 %   Monocytes Relative 9.4 3.0 - 12.0 %   Eosinophils Relative 2.5 0.0 - 5.0 %   Basophils Relative 0.3 0.0 - 3.0 %   Neutro Abs 3.9 1.4 - 7.7 K/uL   Lymphs Abs 2.1 0.7 - 4.0 K/uL   Monocytes Absolute 0.6 0.1 - 1.0 K/uL   Eosinophils Absolute 0.2 0.0 - 0.7 K/uL   Basophils Absolute 0.0 0.0 - 0.1 K/uL  Comprehensive metabolic panel  Result Value Ref Range   Sodium 136 135 - 145 mEq/L   Potassium 3.5 3.5 - 5.1 mEq/L   Chloride 101 96 - 112 mEq/L   CO2 27 19 - 32 mEq/L   Glucose, Bld 118 (H) 70 - 99 mg/dL   BUN 9 6 - 23 mg/dL   Creatinine, Ser 0.83 0.40 - 1.20 mg/dL   Total Bilirubin 0.5 0.2 - 1.2 mg/dL   Alkaline Phosphatase 72 39 - 117 U/L   AST 20 0 - 37 U/L   ALT 19 0 - 35 U/L   Total Protein 7.5 6.0 - 8.3 g/dL   Albumin 3.9 3.5 - 5.2 g/dL   Calcium 8.7 8.4 - 10.5 mg/dL   GFR 76.83 >60.00 mL/min  Microalbumin / creatinine urine ratio  Result Value Ref Range   Microalb, Ur 4.9 (H) 0.0 - 1.9 mg/dL   Creatinine,U 242.3 mg/dL   Microalb Creat Ratio 2.0 0.0 - 30.0 mg/g  LDL cholesterol, direct  Result Value Ref Range   Direct LDL 198.0 mg/dL      Assessment & Plan:   Problem List Items Addressed This Visit    Vitamin D deficiency    Again low. rec restart vit D 1000 IU daily.      Vitamin B12 deficiency    Borderline low. rec start B12 1062mcg oral daily      Obesity    Weight gain noted. Discussed healthy diet. rec against increased exertion until eval by cardiology for exertional chest pain. Body mass index is 50.59 kg/(m^2).      Relevant Medications  sitaGLIPtin (JANUVIA) tablet   HYPERTENSION, BENIGN    Chronic, stable. Continue regimen.      Relevant Medications     nitroGLYCERIN (NITROSTAT) SL tablet   atorvastatin (LIPITOR) tablet   HLD (hyperlipidemia)    rec restart statin. LDL 190s with fmhx. Start atorvastatin 40mg  daily.      Relevant Medications   nitroGLYCERIN (NITROSTAT) SL tablet   atorvastatin (LIPITOR) tablet   Healthcare maintenance - Primary    Preventative protocols reviewed and updated unless pt declined. Discussed healthy diet and lifestyle.       Exertional chest pain    Rx SL nitro prn exertional chest pressure, if used rec ER eval. Pt declines cards referral by [place] today, states she will call today to schedule f/u appt with cardiology.      Ex-smoker    Congratulated on continued abstinence      Diabetes type 2, uncontrolled    Has not tolerated IR metformin. Start [place] 50mg  daily.      Relevant Medications   sitaGLIPtin (JANUVIA) tablet   atorvastatin (LIPITOR) tablet   Depression    Chronic, stable on prozac. cymbalta was ineffective.          Follow up plan: Return in about 6 months (around 03/05/2015), or as needed, for follow up visit.

## 2014-09-02 NOTE — Assessment & Plan Note (Signed)
Preventative protocols reviewed and updated unless pt declined. Discussed healthy diet and lifestyle.  

## 2014-09-02 NOTE — Assessment & Plan Note (Signed)
Congratulated on continued abstinence 

## 2014-09-02 NOTE — Assessment & Plan Note (Signed)
Chronic, stable. Continue regimen. 

## 2014-09-02 NOTE — Patient Instructions (Addendum)
Call [phone] to schedule mammogram ([facility]).  Schedule appointment with Dr Rockey Situ as you're due.  May use sublingual nitro if any chest pain in the meantime. Restart vitamin D 1000 units, b12 1043mcg daily. Restart lipitor (atorvastatin) 40mg  nightly for cholesterol and start [place] 50mg  daily for diabetes.

## 2014-09-02 NOTE — Assessment & Plan Note (Signed)
rec restart statin. LDL 190s with fmhx. Start atorvastatin 40mg  daily.

## 2014-09-02 NOTE — Assessment & Plan Note (Signed)
Rx SL nitro prn exertional chest pressure, if used rec ER eval. Pt declines cards referral by [place] today, states she will call today to schedule f/u appt with cardiology.

## 2014-09-02 NOTE — Assessment & Plan Note (Signed)
Weight gain noted. Discussed healthy diet. rec against increased exertion until eval by cardiology for exertional chest pain. Body mass index is 50.59 kg/(m^2).

## 2014-09-02 NOTE — Progress Notes (Signed)
Pre visit review using our clinic review tool, if applicable. No additional management support is needed unless otherwise documented below in the visit note. 

## 2014-09-02 NOTE — Assessment & Plan Note (Signed)
Has not tolerated IR metformin. Start [place] 50mg  daily.

## 2014-09-02 NOTE — Assessment & Plan Note (Signed)
Chronic, stable on prozac. cymbalta was ineffective.

## 2014-09-02 NOTE — Assessment & Plan Note (Signed)
Borderline low. rec start B12 1086mcg oral daily

## 2014-09-02 NOTE — Addendum Note (Signed)
Addended by: Royann Shivers A on: 09/02/2014 01:35 PM   Modules accepted: Orders

## 2014-09-02 NOTE — Assessment & Plan Note (Signed)
Again low. rec restart vit D 1000 IU daily.

## 2014-09-03 ENCOUNTER — Telehealth: Payer: Self-pay | Admitting: Family Medicine

## 2014-09-03 LAB — CYTOLOGY - PAP

## 2014-09-03 NOTE — Telephone Encounter (Signed)
emmmi emailed

## 2014-09-04 ENCOUNTER — Encounter: Payer: Self-pay | Admitting: Family Medicine

## 2014-09-09 ENCOUNTER — Emergency Department: Payer: Self-pay | Admitting: Emergency Medicine

## 2014-09-09 LAB — HM PAP SMEAR: HM Pap smear: NORMAL

## 2014-09-20 ENCOUNTER — Ambulatory Visit (INDEPENDENT_AMBULATORY_CARE_PROVIDER_SITE_OTHER): Payer: 59 | Admitting: Cardiovascular Disease

## 2014-09-20 ENCOUNTER — Encounter: Payer: Self-pay | Admitting: Family Medicine

## 2014-09-20 ENCOUNTER — Encounter: Payer: Self-pay | Admitting: Cardiovascular Disease

## 2014-09-20 VITALS — BP 112/80 | HR 76 | Ht 65.0 in | Wt 303.5 lb

## 2014-09-20 DIAGNOSIS — G4733 Obstructive sleep apnea (adult) (pediatric): Secondary | ICD-10-CM | POA: Diagnosis not present

## 2014-09-20 DIAGNOSIS — E1165 Type 2 diabetes mellitus with hyperglycemia: Secondary | ICD-10-CM

## 2014-09-20 DIAGNOSIS — R000 Tachycardia, unspecified: Secondary | ICD-10-CM | POA: Diagnosis not present

## 2014-09-20 DIAGNOSIS — IMO0002 Reserved for concepts with insufficient information to code with codable children: Secondary | ICD-10-CM

## 2014-09-20 DIAGNOSIS — I10 Essential (primary) hypertension: Secondary | ICD-10-CM | POA: Diagnosis not present

## 2014-09-20 DIAGNOSIS — R0602 Shortness of breath: Secondary | ICD-10-CM

## 2014-09-20 MED ORDER — EZETIMIBE 10 MG PO TABS: 10.0000 mg | ORAL_TABLET | Freq: Every day | ORAL | Status: DC

## 2014-09-20 MED ORDER — PROPRANOLOL HCL 20 MG PO TABS: 20.0000 mg | ORAL_TABLET | Freq: Three times a day (TID) | ORAL | Status: DC | PRN

## 2014-09-20 NOTE — Assessment & Plan Note (Signed)
She reports that she is compliant with her  CPAP.

## 2014-09-20 NOTE — Patient Instructions (Signed)
You are doing well.  Please start zetia one pill a day for cholesterol  We will order a 30 day monitor for tachycardia, pounding heart beat, severe HTN  Please call us if you have new issues that need to be addressed before your next appt.  Your physician wants you to follow-up in: 6 weeks
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
## 2014-09-20 NOTE — Assessment & Plan Note (Signed)
Blood pressure is well controlled on today's visit. No changes made to the medications. 

## 2014-09-20 NOTE — Assessment & Plan Note (Signed)
We have encouraged continued exercise, careful diet management in an effort to lose weight. 

## 2014-09-20 NOTE — Assessment & Plan Note (Signed)
Shortness of breath likely secondary to underlying obesity and deconditioning. Unable to exclude ischemia,  normal stress in 2011

## 2014-09-20 NOTE — Assessment & Plan Note (Signed)
Long history of palpitations and tachycardia. We have ordered a 30 day monitor given rare episodes of tachycardia, now with chronic episodes of strong heartbeat

## 2014-09-21 DIAGNOSIS — R000 Tachycardia, unspecified: Secondary | ICD-10-CM

## 2014-09-27 ENCOUNTER — Other Ambulatory Visit: Payer: Self-pay | Admitting: Family Medicine

## 2014-09-27 MED ORDER — FLUOXETINE HCL 40 MG PO CAPS: 40.0000 mg | ORAL_CAPSULE | Freq: Every day | ORAL | Status: DC

## 2014-10-07 ENCOUNTER — Encounter: Payer: Self-pay | Admitting: Cardiovascular Disease

## 2014-10-27 ENCOUNTER — Other Ambulatory Visit: Payer: Self-pay | Admitting: Family Medicine

## 2014-10-28 MED ORDER — CYCLOBENZAPRINE HCL 10 MG PO TABS: 10.0000 mg | ORAL_TABLET | Freq: Three times a day (TID) | ORAL | Status: DC | PRN

## 2014-11-01 ENCOUNTER — Ambulatory Visit (INDEPENDENT_AMBULATORY_CARE_PROVIDER_SITE_OTHER): Payer: 59 | Admitting: Cardiovascular Disease

## 2014-11-01 ENCOUNTER — Encounter: Payer: Self-pay | Admitting: Cardiovascular Disease

## 2014-11-01 VITALS — BP 118/82 | HR 64 | Ht 65.0 in | Wt 299.8 lb

## 2014-11-01 DIAGNOSIS — R0602 Shortness of breath: Secondary | ICD-10-CM | POA: Diagnosis not present

## 2014-11-01 DIAGNOSIS — I10 Essential (primary) hypertension: Secondary | ICD-10-CM

## 2014-11-01 DIAGNOSIS — E785 Hyperlipidemia, unspecified: Secondary | ICD-10-CM

## 2014-11-01 DIAGNOSIS — M797 Fibromyalgia: Secondary | ICD-10-CM

## 2014-11-01 DIAGNOSIS — G4733 Obstructive sleep apnea (adult) (pediatric): Secondary | ICD-10-CM | POA: Diagnosis not present

## 2014-11-01 DIAGNOSIS — IMO0002 Reserved for concepts with insufficient information to code with codable children: Secondary | ICD-10-CM

## 2014-11-01 DIAGNOSIS — E669 Obesity, unspecified: Secondary | ICD-10-CM

## 2014-11-01 DIAGNOSIS — R079 Chest pain, unspecified: Secondary | ICD-10-CM

## 2014-11-01 DIAGNOSIS — E1165 Type 2 diabetes mellitus with hyperglycemia: Secondary | ICD-10-CM

## 2014-11-01 NOTE — Assessment & Plan Note (Signed)
Blood pressure is well controlled on today's visit. No changes made to the medications. 

## 2014-11-01 NOTE — Assessment & Plan Note (Signed)
I suspect some of her leg pain is secondary to fibromyalgia

## 2014-11-01 NOTE — Assessment & Plan Note (Signed)
We have encouraged continued exercise, careful diet management in an effort to lose weight. 
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
## 2014-11-01 NOTE — Assessment & Plan Note (Signed)
Cholesterol is very high. She has only recently restarted her Lipitor and zetia

## 2014-11-01 NOTE — Patient Instructions (Signed)
You are doing well. No medication changes were made.  Please call us if you have new issues that need to be addressed before your next appt.  Your physician wants you to follow-up in: 12 months.  You will receive a reminder letter in the mail two months in advance. If you don't receive a letter, please call our office to schedule the follow-up appointment. 

## 2014-11-01 NOTE — Assessment & Plan Note (Signed)
Compliant with her CPAP 

## 2014-11-10 ENCOUNTER — Other Ambulatory Visit: Payer: Self-pay

## 2014-11-10 ENCOUNTER — Encounter (INDEPENDENT_AMBULATORY_CARE_PROVIDER_SITE_OTHER): Payer: 59

## 2014-11-10 DIAGNOSIS — R000 Tachycardia, unspecified: Secondary | ICD-10-CM

## 2014-12-05 ENCOUNTER — Encounter: Payer: Self-pay | Admitting: Family Medicine

## 2014-12-05 ENCOUNTER — Other Ambulatory Visit: Payer: Self-pay | Admitting: Family Medicine

## 2014-12-06 ENCOUNTER — Ambulatory Visit: Payer: 59 | Admitting: Family Medicine

## 2014-12-07 ENCOUNTER — Ambulatory Visit (INDEPENDENT_AMBULATORY_CARE_PROVIDER_SITE_OTHER): Payer: 59 | Admitting: Family Medicine

## 2014-12-07 ENCOUNTER — Encounter: Payer: Self-pay | Admitting: Family Medicine

## 2014-12-07 VITALS — BP 128/82 | HR 72 | Temp 98.3°F | Wt 302.0 lb

## 2014-12-07 DIAGNOSIS — F331 Major depressive disorder, recurrent, moderate: Secondary | ICD-10-CM | POA: Diagnosis not present

## 2014-12-07 DIAGNOSIS — M545 Low back pain, unspecified: Secondary | ICD-10-CM

## 2014-12-07 MED ORDER — BUPROPION HCL 75 MG PO TABS: 75.0000 mg | ORAL_TABLET | Freq: Two times a day (BID) | ORAL | Status: DC

## 2014-12-07 MED ORDER — NAPROXEN 500 MG PO TABS: ORAL_TABLET | ORAL | Status: DC

## 2014-12-07 MED ORDER — FLUOXETINE HCL 20 MG PO CAPS: 20.0000 mg | ORAL_CAPSULE | Freq: Every day | ORAL | Status: DC

## 2014-12-07 NOTE — Assessment & Plan Note (Signed)
Recent worsening.  Has evidence on exam for left sacroiliitis and left trochanteric bursitis. Treat with naprosyn course (recent cardiac evaluation stable) and stretching exercises for sacroiliitis provided from [facility] pt advisor. Continue home ice/heating pad and TENS unit.  If not improving would check SI joint films and refer to ortho/spine to discuss dx/tx sacroiliac joint injection

## 2014-12-07 NOTE — Assessment & Plan Note (Signed)
Recently deteriorated, unsure inciting trigger other than worsening pain. Discussed this (situational depression to worsening pain), support provided. Not true SI/HI.  Discussed treatment options. Will increase prozac - which has been effective - to total 60mg  daily (20mg  in am and 40mg  in pm) and add adjuvant wellbutrin start at 75mg  bid. Update if not improving with treatment, otherwise RTC 1 mo f/u visit. Discussed red flags to seek emergent care.

## 2014-12-07 NOTE — Progress Notes (Signed)
Pre visit review using our clinic review tool, if applicable. No additional management support is needed unless otherwise documented below in the visit note. 

## 2014-12-07 NOTE — Progress Notes (Signed)
BP 128/82 mmHg  Pulse 72  Temp(Src) 98.3 F (36.8 C) (Oral)  Wt 302 lb (136.986 kg)   CC: worsening depression  Subjective:    Patient ID: Kathleen Good, female    DOB: 1961-03-24, 54 y.o.   MRN: [id number]  HPI: Kathleen Good is a 54 y.o. female presenting on 12/07/2014 for Depression   Husband's aunt died this past weekend.   Worsening depression over last 1 month. Depressed mood. Anhedonia. Increased fatigue/energy, increased somnolence, decreased focusing ability, appetite unchanged, no guilt. Increased irritability as well. No manic sxs. H/o severe depression years ago. Chronically on prozac 40mg  daily. cymbalta previously tried and ineffective at lower doses.   Increased pain from increased activity. Pushing through pain. Main pain is L lower back and hip as well as throughout body. Treats with heating pad and ice, also uses TENS unit. Self treating with tylenol/advil/aleve. Enjoys going to the store and cooking but no longer able to do this because too painful.   Last week between wake/sleep had thought of shooting herself and thought "how peaceful". Unsure if she was fully awake with this thought.   PHQ9 = 18 today, extremely difficult to function.   Intolerant to lyrica in the past (swelling). Gabapentin may have caused visual hallucinations.  Known fibromyalgia and cymbalta didn't help.  OSA - unable to afford CPAP.  Relevant past medical, surgical, family and social history reviewed and updated as indicated. Interim medical history since our last visit reviewed. Allergies and medications reviewed and updated. Current Outpatient Prescriptions on File Prior to Visit  Medication Sig  . aspirin 81 MG EC tablet Take 81 mg by mouth daily.    Marland Kitchen atorvastatin (LIPITOR) 40 MG tablet Take 1 tablet (40 mg total) by mouth daily.  Marland Kitchen CARTIA XT 180 MG 24 hr capsule TAKE ONE CAPSULE BY MOUTH ONE TIME DAILY  . Cholecalciferol ([employer]S VITAMIN D3) 1000 UNITS capsule Take 1 capsule (1,000  Units total) by mouth daily.  . cyclobenzaprine (FLEXERIL) 10 MG tablet Take 1 tablet (10 mg total) by mouth 3 (three) times daily as needed for muscle spasms.  Marland Kitchen ezetimibe (ZETIA) 10 MG tablet Take 1 tablet (10 mg total) by mouth daily.  Marland Kitchen FLUoxetine (PROZAC) 40 MG capsule Take 1 capsule (40 mg total) by mouth daily. (Patient taking differently: Take 40 mg by mouth daily. In PM)  . metoprolol succinate (TOPROL-XL) 100 MG 24 hr tablet TAKE TWO TABLETS DAILY WITH FOOD  . nitroGLYCERIN (NITROSTAT) 0.4 MG SL tablet Place 1 tablet (0.4 mg total) under the tongue every 5 (five) minutes as needed for chest pain. If needed recommend ER evaluation  . omeprazole (PRILOSEC) 20 MG capsule TAKE ONE CAPSULE BY MOUTH TWICE DAILY  . propranolol (INDERAL) 20 MG tablet Take 1 tablet (20 mg total) by mouth 3 (three) times daily as needed.  . sitaGLIPtin (JANUVIA) 50 MG tablet Take 1 tablet (50 mg total) by mouth daily.  Marland Kitchen triamcinolone ointment (KENALOG) 0.5 % Apply 1 application topically 2 (two) times daily. 2 times a day as needed  . vitamin B-12 (CYANOCOBALAMIN) 1000 MCG tablet Take 1 tablet (1,000 mcg total) by mouth daily.  . [DISCONTINUED] metFORMIN (GLUCOPHAGE) 500 MG tablet Take 1 tablet (500 mg total) by mouth 2 (two) times daily with a meal.   No current facility-administered medications on file prior to visit.    Review of Systems Per HPI unless specifically indicated above     Objective:    BP 128/82 mmHg  Pulse 72  Temp(Src) 98.3 F (36.8 C) (Oral)  Wt 302 lb (136.986 kg)  Wt Readings from Last 3 Encounters:  12/07/14 302 lb (136.986 kg)  11/01/14 299 lb 12 oz (135.966 kg)  09/20/14 303 lb 8 oz (137.667 kg)    Physical Exam  Constitutional: She appears well-developed and well-nourished. No distress.  HENT:  Mouth/Throat: Oropharynx is clear and moist. No oropharyngeal exudate.  Neck: No thyromegaly present.  Cardiovascular: Normal rate, regular rhythm, normal heart sounds and intact  distal pulses.   No murmur heard. Pulmonary/Chest: Effort normal and breath sounds normal. No respiratory distress. She has no wheezes. She has no rales.  Musculoskeletal:  Tender to palpation lower midline lumbar spine No paraspinous mm tenderness Neg SLR bilaterally. No pain with int/ext rotation at hip. + FABER on left. + pain at left SIJ, as well as GTB on left. No sciatic notch tenderness bilaterally.   Skin: Skin is warm and dry.  Psychiatric: She has a normal mood and affect. Her behavior is normal. Thought content normal.  Nursing note and vitals reviewed.  Lab Results  Component Value Date   TSH 2.34 08/30/2014   LUMBAR SPINE - COMPLETE 4+ VIEW 11/24/2012 Comparison: CT abdomen pelvis of 07/21/2012 Findings: The lumbar vertebrae are in normal alignment. Intervertebral disc spaces appear grossly normal. There is mild degenerative change of the facet joints of L4-5 and L5-S1. The SI joints appear corticated. IMPRESSION: Normal alignment with normal disc spaces. Mild degenerative change of the facet joints of L4-5 and L5-S1. Original Report Authenticated By: Ivar Drape, M.D.    Assessment & Plan:   Problem List Items Addressed This Visit    Lower back pain    Recent worsening.  Has evidence on exam for left sacroiliitis and left trochanteric bursitis. Treat with naprosyn course (recent cardiac evaluation stable) and stretching exercises for sacroiliitis provided from [facility] pt advisor. Continue home ice/heating pad and TENS unit.  If not improving would check SI joint films and refer to ortho/spine to discuss dx/tx sacroiliac joint injection      Relevant Medications   naproxen (NAPROSYN) 500 MG tablet   MDD (major depressive disorder), recurrent episode, moderate - Primary    Recently deteriorated, unsure inciting trigger other than worsening pain. Discussed this (situational depression to worsening pain), support provided. Not true SI/HI.  Discussed treatment  options. Will increase prozac - which has been effective - to total 60mg  daily (20mg  in am and 40mg  in pm) and add adjuvant wellbutrin start at 75mg  bid. Update if not improving with treatment, otherwise RTC 1 mo f/u visit. Discussed red flags to seek emergent care.      Relevant Medications   FLUoxetine (PROZAC) 20 MG capsule   buPROPion (WELLBUTRIN) 75 MG tablet       Follow up plan: Return in about 4 weeks (around 01/04/2015), or as needed, for follow up visit.

## 2014-12-07 NOTE — Patient Instructions (Addendum)
For back - I think you have left hip bursitis as well as sacroiliitis. Treat with continued stretching exercises and with prescription anti inflammatory course sent to pharmacy today. For worsening depression - increase prozac to 60mg  daily (20mg  in the morning and 40mg  in evening) and add on wellbutrin 75mg  twice daily. Call me with update especially if not improving. Return in 1 month for follow up visit.

## 2014-12-09 ENCOUNTER — Telehealth: Payer: Self-pay | Admitting: *Deleted

## 2014-12-09 ENCOUNTER — Encounter: Payer: Self-pay | Admitting: Family Medicine

## 2014-12-09 MED ORDER — METHOCARBAMOL 500 MG PO TABS: 500.0000 mg | ORAL_TABLET | Freq: Three times a day (TID) | ORAL | Status: DC | PRN

## 2014-12-09 NOTE — Telephone Encounter (Signed)
error 

## 2014-12-09 NOTE — Telephone Encounter (Signed)
Please see Mychart message from patient. I've already advised her not to take any other NSAID while taking the naproxen.

## 2014-12-20 ENCOUNTER — Other Ambulatory Visit: Payer: Self-pay

## 2015-01-06 ENCOUNTER — Ambulatory Visit (INDEPENDENT_AMBULATORY_CARE_PROVIDER_SITE_OTHER): Payer: 59 | Admitting: Family Medicine

## 2015-01-06 ENCOUNTER — Encounter: Payer: Self-pay | Admitting: Family Medicine

## 2015-01-06 ENCOUNTER — Other Ambulatory Visit: Payer: Self-pay | Admitting: Family Medicine

## 2015-01-06 VITALS — BP 126/86 | HR 64 | Temp 98.3°F | Wt 303.8 lb

## 2015-01-06 DIAGNOSIS — F331 Major depressive disorder, recurrent, moderate: Secondary | ICD-10-CM

## 2015-01-06 DIAGNOSIS — M545 Low back pain, unspecified: Secondary | ICD-10-CM

## 2015-01-06 MED ORDER — AMITRIPTYLINE HCL 25 MG PO TABS: 25.0000 mg | ORAL_TABLET | Freq: Two times a day (BID) | ORAL | Status: DC

## 2015-01-06 NOTE — Patient Instructions (Addendum)
Continue prozac 60mg  daily. Let's stop wellbutrin. Start amitritpyline 25mg  nightly, if tolerated well, may increase to 25mg  twice daily.  Restart naprosyn anti inflammatory as needed for pain. Keep legs elevated.  Xrays today. Depending on results we will refer you to orthopedist or physical therapy.

## 2015-01-06 NOTE — Progress Notes (Signed)
Pre visit review using our clinic review tool, if applicable. No additional management support is needed unless otherwise documented below in the visit note. 

## 2015-01-06 NOTE — Assessment & Plan Note (Signed)
Naprosyn has helped knee and hips but persistent lower back pain. Still consistent with sacroiliitis and possible sciatica, bursitis has calmed down some. Given longevity of sxs, will check lumbar films today.  Continue naprosyn. Consider PT vs ortho referral. Pt agrees with plan. States tramadol ineffective. Has not tolerated codeine or hydrocodone well in past.

## 2015-01-06 NOTE — Addendum Note (Signed)
Addended by: Ria Bush on: 01/06/2015 02:49 PM   Modules accepted: Orders

## 2015-01-06 NOTE — Assessment & Plan Note (Signed)
Still anticipate large component of situational depression from uncontrolled pain. No improvement with increased prozac and with wellbutrin. Continue 60mg  prozac, stop wellbutrin and start amitriptyline 25mg  nightly with option to increase to 25mg  bid. Hopefully will assist with back pain as well. Pt agrees with plan.

## 2015-01-06 NOTE — Progress Notes (Signed)
BP 126/86 mmHg  Pulse 64  Temp(Src) 98.3 F (36.8 C) (Oral)  Wt 303 lb 12 oz (137.78 kg)  LMP 05/20/2014   CC: f/u visit  Subjective:    Patient ID: Kathleen Good, female    DOB: [birth year], 54 y.o.   MRN: [id number]  HPI: Kathleen Good is a 54 y.o. female presenting on 01/06/2015 for Follow-up   See prior note for details. Seen here 12/07/2014 with worsening depression and LBP thought related to L sacroiliitis and L trochanteric bursitis. Thought situational depression from deteriorated pain control. We started naprosyn course and exercises. We also increased prozac to 60mg  daily and added wellbutrin 75mg  bid. Here for f/u.   This past month feeling more irritable, hateful, but sill with crying spells.   Knees improved, hips so so, back unchanged with persistent pain.   Prior cymbalta caused foot swelling.   Persistent fleeting passive SI but no plan, would not go through with this.  Relevant past medical, surgical, family and social history reviewed and updated as indicated. Interim medical history since our last visit reviewed. Allergies and medications reviewed and updated. Current Outpatient Prescriptions on File Prior to Visit  Medication Sig  . aspirin 81 MG EC tablet Take 81 mg by mouth daily.    Marland Kitchen atorvastatin (LIPITOR) 40 MG tablet Take 1 tablet (40 mg total) by mouth daily.  Marland Kitchen CARTIA XT 180 MG 24 hr capsule TAKE ONE CAPSULE BY MOUTH ONE TIME DAILY  . Cholecalciferol ([employer]S VITAMIN D3) 1000 UNITS capsule Take 1 capsule (1,000 Units total) by mouth daily.  Marland Kitchen ezetimibe (ZETIA) 10 MG tablet Take 1 tablet (10 mg total) by mouth daily.  Marland Kitchen FLUoxetine (PROZAC) 20 MG capsule Take 1 capsule (20 mg total) by mouth daily. In AM  . FLUoxetine (PROZAC) 40 MG capsule Take 1 capsule (40 mg total) by mouth daily. (Patient taking differently: Take 40 mg by mouth daily. In PM)  . methocarbamol (ROBAXIN) 500 MG tablet Take 1-2 tablets (500-1,000 mg total) by mouth 3 (three) times daily as  needed for muscle spasms (sedation precautions).  . metoprolol succinate (TOPROL-XL) 100 MG 24 hr tablet TAKE TWO TABLETS DAILY WITH FOOD  . nitroGLYCERIN (NITROSTAT) 0.4 MG SL tablet Place 1 tablet (0.4 mg total) under the tongue every 5 (five) minutes as needed for chest pain. If needed recommend ER evaluation  . omeprazole (PRILOSEC) 20 MG capsule TAKE ONE CAPSULE BY MOUTH TWICE DAILY  . propranolol (INDERAL) 20 MG tablet Take 1 tablet (20 mg total) by mouth 3 (three) times daily as needed.  . sitaGLIPtin (JANUVIA) 50 MG tablet Take 1 tablet (50 mg total) by mouth daily.  Marland Kitchen triamcinolone ointment (KENALOG) 0.5 % Apply 1 application topically 2 (two) times daily. 2 times a day as needed  . vitamin B-12 (CYANOCOBALAMIN) 1000 MCG tablet Take 1 tablet (1,000 mcg total) by mouth daily.  . [DISCONTINUED] metFORMIN (GLUCOPHAGE) 500 MG tablet Take 1 tablet (500 mg total) by mouth 2 (two) times daily with a meal.   No current facility-administered medications on file prior to visit.    Review of Systems Per HPI unless specifically indicated above     Objective:    BP 126/86 mmHg  Pulse 64  Temp(Src) 98.3 F (36.8 C) (Oral)  Wt 303 lb 12 oz (137.78 kg)  LMP 05/20/2014  Wt Readings from Last 3 Encounters:  01/06/15 303 lb 12 oz (137.78 kg)  12/07/14 302 lb (136.986 kg)  11/01/14 299 lb 12  oz (135.966 kg)    Physical Exam  Constitutional: She appears well-developed and well-nourished. No distress.  Musculoskeletal: She exhibits no edema.  Mild nonpitting edema BLE + pain midline lower lumbar spine + upper lumbar paraspinous mm tenderness  + pain at SIJ, GTB and sciatic notch bilaterally. Predominantly at SIJ and sciatic notch  Skin: Skin is warm and dry. No rash noted.  Psychiatric: She has a normal mood and affect.  Nursing note and vitals reviewed.  Results for orders placed or performed in visit on 09/02/14  HM PAP SMEAR  Result Value Ref Range   HM Pap smear Normal   Cytology  - PAP  Result Value Ref Range   CYTOLOGY - PAP PAP RESULT       Assessment & Plan:   Problem List Items Addressed This Visit    Lower back pain    Naprosyn has helped knee and hips but persistent lower back pain. Still consistent with sacroiliitis and possible sciatica, bursitis has calmed down some. Given longevity of sxs, will check lumbar films today.  Continue naprosyn. Consider PT vs ortho referral. Pt agrees with plan. States tramadol ineffective. Has not tolerated codeine or hydrocodone well in past.      Relevant Orders   DG Lumbar Spine Complete   MDD (major depressive disorder), recurrent episode, moderate - Primary    Still anticipate large component of situational depression from uncontrolled pain. No improvement with increased prozac and with wellbutrin. Continue 60mg  prozac, stop wellbutrin and start amitriptyline 25mg  nightly with option to increase to 25mg  bid. Hopefully will assist with back pain as well. Pt agrees with plan.      Relevant Medications   amitriptyline (ELAVIL) 25 MG tablet       Follow up plan: Return as needed, for follow up visit.

## 2015-01-07 ENCOUNTER — Encounter: Payer: Self-pay | Admitting: Family Medicine

## 2015-01-11 ENCOUNTER — Telehealth: Payer: Self-pay

## 2015-01-11 NOTE — Telephone Encounter (Signed)
Request from [place] , sent to [place] on 01/12/2015 .

## 2015-01-12 ENCOUNTER — Encounter: Payer: Self-pay | Admitting: Family Medicine

## 2015-01-14 ENCOUNTER — Other Ambulatory Visit: Payer: Self-pay | Admitting: Family Medicine

## 2015-01-14 MED ORDER — BUPROPION HCL 75 MG PO TABS: 75.0000 mg | ORAL_TABLET | Freq: Two times a day (BID) | ORAL | Status: DC

## 2015-01-17 ENCOUNTER — Telehealth: Payer: Self-pay

## 2015-01-17 NOTE — Telephone Encounter (Signed)
Request from [place] , sent to [place] on 01/18/2015 .

## 2015-01-28 ENCOUNTER — Ambulatory Visit
Admission: RE | Admit: 2015-01-28 | Discharge: 2015-01-28 | Disposition: A | Discharge status: Home / Self Care | Payer: 59 | Source: Ambulatory Visit | Attending: Family Medicine | Admitting: Family Medicine

## 2015-01-28 DIAGNOSIS — M5137 Other intervertebral disc degeneration, lumbosacral region: Secondary | ICD-10-CM | POA: Diagnosis not present

## 2015-01-28 DIAGNOSIS — M545 Low back pain, unspecified: Secondary | ICD-10-CM

## 2015-01-29 ENCOUNTER — Encounter: Payer: Self-pay | Admitting: Family Medicine

## 2015-02-03 ENCOUNTER — Encounter: Payer: Self-pay | Admitting: Family Medicine

## 2015-02-03 DIAGNOSIS — M545 Low back pain, unspecified: Secondary | ICD-10-CM

## 2015-02-03 DIAGNOSIS — E669 Obesity, unspecified: Secondary | ICD-10-CM

## 2015-02-03 DIAGNOSIS — M797 Fibromyalgia: Secondary | ICD-10-CM

## 2015-02-03 MED ORDER — BUPROPION HCL 100 MG PO TABS: 100.0000 mg | ORAL_TABLET | Freq: Two times a day (BID) | ORAL | Status: DC

## 2015-02-03 NOTE — Addendum Note (Signed)
Addended by: Ria Bush on: 02/03/2015 03:05 PM   Modules accepted: Orders

## 2015-02-03 NOTE — Telephone Encounter (Addendum)
PM&R referral placed

## 2015-02-07 ENCOUNTER — Other Ambulatory Visit: Payer: Self-pay | Admitting: Family Medicine

## 2015-02-08 NOTE — Telephone Encounter (Signed)
Received refill request electronically See drug warning with Atorvastatin Is it okay to refill?

## 2015-02-09 ENCOUNTER — Other Ambulatory Visit: Payer: Self-pay | Admitting: Family Medicine

## 2015-02-09 MED ORDER — DILTIAZEM HCL ER COATED BEADS 180 MG PO CP24: 180.0000 mg | ORAL_CAPSULE | Freq: Every day | ORAL | Status: DC

## 2015-02-21 ENCOUNTER — Other Ambulatory Visit: Payer: Self-pay | Admitting: Orthopedic Surgery

## 2015-02-21 DIAGNOSIS — M545 Low back pain: Secondary | ICD-10-CM

## 2015-02-22 ENCOUNTER — Encounter: Payer: Self-pay | Admitting: Family Medicine

## 2015-02-24 MED ORDER — BUPROPION HCL ER (SR) 150 MG PO TB12: 150.0000 mg | ORAL_TABLET | Freq: Two times a day (BID) | ORAL | Status: DC

## 2015-02-27 ENCOUNTER — Ambulatory Visit
Admission: RE | Admit: 2015-02-27 | Discharge: 2015-02-27 | Disposition: A | Discharge status: Home / Self Care | Payer: 59 | Source: Ambulatory Visit | Attending: Orthopedic Surgery | Admitting: Orthopedic Surgery

## 2015-02-27 DIAGNOSIS — M545 Low back pain: Secondary | ICD-10-CM

## 2015-03-11 ENCOUNTER — Encounter: Payer: Self-pay | Admitting: Family Medicine

## 2015-04-01 ENCOUNTER — Other Ambulatory Visit: Payer: Self-pay | Admitting: Family Medicine

## 2015-04-04 MED ORDER — METHOCARBAMOL 500 MG PO TABS: 500.0000 mg | ORAL_TABLET | Freq: Three times a day (TID) | ORAL | Status: DC | PRN

## 2015-04-11 ENCOUNTER — Encounter: Payer: Self-pay | Admitting: Family Medicine

## 2015-04-14 ENCOUNTER — Encounter: Payer: Self-pay | Admitting: Family Medicine

## 2015-04-14 ENCOUNTER — Ambulatory Visit (INDEPENDENT_AMBULATORY_CARE_PROVIDER_SITE_OTHER): Payer: 59 | Admitting: Family Medicine

## 2015-04-14 VITALS — BP 120/82 | HR 75 | Temp 99.2°F | Ht 65.0 in | Wt 306.2 lb

## 2015-04-14 DIAGNOSIS — R609 Edema, unspecified: Secondary | ICD-10-CM | POA: Insufficient documentation

## 2015-04-14 LAB — COMPREHENSIVE METABOLIC PANEL
ALT: 17 U/L (ref 0–35)
AST: 15 U/L (ref 0–37)
Albumin: 3.8 g/dL (ref 3.5–5.2)
Alkaline Phosphatase: 66 U/L (ref 39–117)
BUN: 10 mg/dL (ref 6–23)
CO2: 28 mEq/L (ref 19–32)
Calcium: 8.8 mg/dL (ref 8.4–10.5)
Chloride: 102 mEq/L (ref 96–112)
Creatinine, Ser: 0.86 mg/dL (ref 0.40–1.20)
GFR: 73.57 mL/min (ref >60.00)
Glucose, Bld: 102 mg/dL — ABNORMAL HIGH (ref 70–99)
Potassium: 3.9 mEq/L (ref 3.5–5.1)
Sodium: 139 mEq/L (ref 135–145)
Total Bilirubin: 0.3 mg/dL (ref 0.2–1.2)
Total Protein: 7.4 g/dL (ref 6.0–8.3)

## 2015-04-14 LAB — CBC WITH DIFFERENTIAL/PLATELET
Basophils Absolute: 0 10*3/uL (ref 0.0–0.1)
Basophils Relative: 0.4 % (ref 0.0–3.0)
Eosinophils Absolute: 0.6 10*3/uL (ref 0.0–0.7)
Eosinophils Relative: 4.9 % (ref 0.0–5.0)
HCT: 37.3 % (ref 36.0–46.0)
Hemoglobin: 12.1 g/dL (ref 12.0–15.0)
Lymphocytes Relative: 19.5 % (ref 12.0–46.0)
Lymphs Abs: 2.3 10*3/uL (ref 0.7–4.0)
MCHC: 32.4 g/dL (ref 30.0–36.0)
MCV: 87.6 fl (ref 78.0–100.0)
Monocytes Absolute: 0.9 10*3/uL (ref 0.1–1.0)
Monocytes Relative: 7.8 % (ref 3.0–12.0)
Neutro Abs: 8 10*3/uL — ABNORMAL HIGH (ref 1.4–7.7)
Neutrophils Relative %: 67.4 % (ref 43.0–77.0)
Platelets: 321 10*3/uL (ref 150.0–400.0)
RBC: 4.25 Mil/uL (ref 3.87–5.11)
RDW: 16.5 % — ABNORMAL HIGH (ref 11.5–15.5)
WBC: 11.8 10*3/uL — ABNORMAL HIGH (ref 4.0–10.5)

## 2015-04-14 LAB — TSH: TSH: 1.67 u[IU]/mL (ref 0.35–4.50)

## 2015-04-14 NOTE — Patient Instructions (Addendum)
Stop at lab on way out.  Start compression hose, elevated feet when sitting, above heart.  Work on [employer] loss and exercise as able.  We will call with recommendations.  Go to ER if severe shortness of breath.

## 2015-04-14 NOTE — Telephone Encounter (Signed)
Pt asks to pick up handicap placard on Monday - see email.

## 2015-04-14 NOTE — Progress Notes (Signed)
   Subjective:    Patient ID: Kathleen Good, female    DOB: 1960/12/21, 54 y.o.   MRN: [id number]  HPI 25 yea rold female pt of Dr.G with history of morbidly obese, HTN, uncontrolled DM presents with  new onset  swelling in bilateral legs, left worse than right in 3-4 days. Typically has some swelling but this is worse than usual.  Worse later in the day.  No chest pain, no SOB, always has some DOE, mild.   Was on lasix in past, HCTZ.  No new changes in diet, no new meds. No recent long trips, relatively immobile.  She did have facet injection 3 weeks ago.   Cardiologist: Dr. Rockey Situ.  ECHO 2011 EF 65% BP Readings from Last 3 Encounters:  04/14/15 120/82  01/06/15 126/86  12/07/14 128/82       Review of Systems  Constitutional: Negative for fever and fatigue.  HENT: Negative for ear pain.   Eyes: Negative for pain.  Respiratory: Negative for chest tightness and shortness of breath.   Cardiovascular: Negative for chest pain, palpitations and leg swelling.  Gastrointestinal: Negative for abdominal pain.  Genitourinary: Negative for dysuria.       Objective:   Physical Exam  Constitutional: Vital signs are normal. She appears well-developed and well-nourished. She is cooperative.  Non-toxic appearance. She does not appear ill. No distress.  obese  HENT:  Head: Normocephalic.  Right Ear: Hearing, tympanic membrane, external ear and ear canal normal. Tympanic membrane is not erythematous, not retracted and not bulging.  Left Ear: Hearing, tympanic membrane, external ear and ear canal normal. Tympanic membrane is not erythematous, not retracted and not bulging.  Nose: No mucosal edema or rhinorrhea. Right sinus exhibits no maxillary sinus tenderness and no frontal sinus tenderness. Left sinus exhibits no maxillary sinus tenderness and no frontal sinus tenderness.  Mouth/Throat: Uvula is midline, oropharynx is clear and moist and mucous membranes are normal.  Eyes:  Conjunctivae, EOM and lids are normal. Pupils are equal, round, and reactive to light. Lids are everted and swept, no foreign bodies found.  Neck: Trachea normal and normal range of motion. Neck supple. Carotid bruit is not present. No thyroid mass and no thyromegaly present.  Cardiovascular: Normal rate, regular rhythm, S1 normal, S2 normal, normal heart sounds, intact distal pulses and normal pulses.  Exam reveals no gallop and no friction rub.   No murmur heard. 2 plus pitting edema bilateral ankles  Pulmonary/Chest: Effort normal and breath sounds normal. No tachypnea. No respiratory distress. She has no decreased breath sounds. She has no wheezes. She has no rhonchi. She has no rales.  Abdominal: Soft. Normal appearance and bowel sounds are normal. There is no tenderness.  Neurological: She is alert.  Skin: Skin is warm, dry and intact. No rash noted.  Psychiatric: Her speech is normal and behavior is normal. Judgment and thought content normal. Her mood appears not anxious. Cognition and memory are normal. She does not exhibit a depressed mood.          Assessment & Plan:

## 2015-04-14 NOTE — Progress Notes (Signed)
Pre visit review using our clinic review tool, if applicable. No additional management support is needed unless otherwise documented below in the visit note. 

## 2015-04-15 ENCOUNTER — Telehealth: Payer: Self-pay | Admitting: *Deleted

## 2015-04-15 MED ORDER — HYDROCHLOROTHIAZIDE 25 MG PO TABS: 25.0000 mg | ORAL_TABLET | Freq: Every day | ORAL | Status: DC

## 2015-04-15 NOTE — Telephone Encounter (Signed)
Ms Kathleen Good notified to start HCTZ daily.  Rx sent to [facility] [address].

## 2015-04-15 NOTE — Telephone Encounter (Signed)
-----   Message from Jinny Sanders, MD sent at 04/15/2015  1:06 PM EDT ----- Start  HCTZ 25 mg daily. # 30, 5 rf  Send int ot pharm of choice  please.

## 2015-04-15 NOTE — Telephone Encounter (Signed)
In your IN box for completion.  

## 2015-04-18 NOTE — Telephone Encounter (Signed)
Filled and in Kim's box. 

## 2015-04-18 NOTE — Telephone Encounter (Signed)
Placed up front for pickup ° °

## 2015-05-13 NOTE — Assessment & Plan Note (Signed)
Eval with labs for new change. Likely multifactorial due to obesity, limited mobility and diet.  Start compression hose, elevated feet when sitting, above heart.  Work on [employer] loss and exercise as able.

## 2015-05-26 ENCOUNTER — Other Ambulatory Visit: Payer: Self-pay | Admitting: Family Medicine

## 2015-05-27 MED ORDER — VITAMIN B-12 1000 MCG PO TABS: 1000.0000 ug | ORAL_TABLET | Freq: Every day | ORAL | Status: DC

## 2015-05-27 MED ORDER — OMEPRAZOLE 20 MG PO CPDR: 20.0000 mg | DELAYED_RELEASE_CAPSULE | Freq: Two times a day (BID) | ORAL | Status: DC

## 2015-05-27 MED ORDER — FLUOXETINE HCL 40 MG PO CAPS: 40.0000 mg | ORAL_CAPSULE | Freq: Every day | ORAL | Status: DC

## 2015-08-12 LAB — HM DIABETES EYE EXAM

## 2015-08-14 ENCOUNTER — Encounter: Payer: Self-pay | Admitting: Family Medicine

## 2015-08-15 ENCOUNTER — Other Ambulatory Visit: Payer: Self-pay | Admitting: Family Medicine

## 2015-08-16 MED ORDER — METHOCARBAMOL 500 MG PO TABS: 500.0000 mg | ORAL_TABLET | Freq: Three times a day (TID) | ORAL | Status: DC | PRN

## 2015-08-16 MED ORDER — OMEPRAZOLE 20 MG PO CPDR: 20.0000 mg | DELAYED_RELEASE_CAPSULE | Freq: Two times a day (BID) | ORAL | Status: DC

## 2015-08-26 ENCOUNTER — Ambulatory Visit (INDEPENDENT_AMBULATORY_CARE_PROVIDER_SITE_OTHER): Payer: 59 | Admitting: Family Medicine

## 2015-08-26 ENCOUNTER — Encounter: Payer: Self-pay | Admitting: Family Medicine

## 2015-08-26 VITALS — BP 124/84 | HR 72 | Temp 98.3°F | Wt 305.2 lb

## 2015-08-26 DIAGNOSIS — M545 Low back pain, unspecified: Secondary | ICD-10-CM

## 2015-08-26 DIAGNOSIS — F331 Major depressive disorder, recurrent, moderate: Secondary | ICD-10-CM

## 2015-08-26 DIAGNOSIS — R21 Rash and other nonspecific skin eruption: Secondary | ICD-10-CM

## 2015-08-26 DIAGNOSIS — G8929 Other chronic pain: Secondary | ICD-10-CM

## 2015-08-26 DIAGNOSIS — M797 Fibromyalgia: Secondary | ICD-10-CM

## 2015-08-26 DIAGNOSIS — Z6841 Body Mass Index (BMI) 40.0 and over, adult: Secondary | ICD-10-CM

## 2015-08-26 MED ORDER — CLOBETASOL PROPIONATE 0.05 % EX CREA: 1.0000 "application " | TOPICAL_CREAM | Freq: Two times a day (BID) | CUTANEOUS | Status: DC

## 2015-08-26 MED ORDER — ORLISTAT 60 MG PO CAPS: 60.0000 mg | ORAL_CAPSULE | Freq: Three times a day (TID) | ORAL | Status: DC
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
## 2015-08-26 NOTE — Patient Instructions (Addendum)
We will refer you to pain clinic in [place] or orlistat for weight loss - sent to pharmacy. For likely psoriasis of left elbow - try stronger steroid cream clobetasol sent to pharmacy.  Let me know how you're doing.  Return in 3 months for physical.

## 2015-08-26 NOTE — Progress Notes (Signed)
Pre visit review using our clinic review tool, if applicable. No additional management support is needed unless otherwise documented below in the visit note. 

## 2015-08-27 NOTE — Assessment & Plan Note (Addendum)
Discussed bariatric surgery - pt feels unaffordable at this time. Discussed bariatric pharmacotherapy - rec against phentermine 2/2 tachycardia hx, rec against belviq 2/2 mood disorder and current psychotropic medication regimen. Discussed orlistat mechanism of action and side effects to monitor for - will start and monitor tolerance. Pt agrees with plan.

## 2015-08-27 NOTE — Assessment & Plan Note (Addendum)
Silvery scaly patch on left dorsal elbow most consistent with psoriasis vs isolated nummular eczema.  Treat with higher strength steroid cream clobetasol sent to pharmacy - update with effect

## 2015-08-27 NOTE — Assessment & Plan Note (Addendum)
Reviewed management to date including recent ESI/RF ablation as well as latest MRI. Not felt to be a surgical candidate. Pain affecting day to day activities, not currently well managed on methocarbamol. Pt has stopped NSAID (prior on naprosyn). Failed prior medical management (cymbalta, elavil, tramadol) and has multiple med intolerances (codeine, hydrocodone). Offered pain management referral - pt agrees and requests eval in [place]. Reviewed importance of weight loss to help control chronic pain - see below for weight management plan.

## 2015-08-27 NOTE — Assessment & Plan Note (Addendum)
Stable period on prozac 60mg  and higher wellbutrin dose 150mg  bid. Continue.  Did not tolerate amitriptyline

## 2015-08-28 ENCOUNTER — Encounter: Payer: Self-pay | Admitting: Family Medicine

## 2015-08-29 MED ORDER — ORLISTAT 120 MG PO CAPS: 120.0000 mg | ORAL_CAPSULE | Freq: Three times a day (TID) | ORAL | Status: DC

## 2015-08-31 NOTE — Telephone Encounter (Signed)
See Mychart message. Handicapped form in your IN box for completion. I'm working on PA for Xenical now.

## 2015-09-01 NOTE — Telephone Encounter (Signed)
Filled and in Kim's box. 

## 2015-09-02 NOTE — Telephone Encounter (Signed)
Patient notified via Mychart and paperwork placed upfront for pick up.

## 2015-09-08 ENCOUNTER — Encounter: Payer: Self-pay | Admitting: *Deleted

## 2015-09-26 ENCOUNTER — Ambulatory Visit: Payer: 59 | Admitting: Family Medicine

## 2015-09-27 ENCOUNTER — Encounter: Payer: Self-pay | Admitting: Family Medicine

## 2015-09-27 ENCOUNTER — Other Ambulatory Visit: Payer: Self-pay | Admitting: Family Medicine

## 2015-09-27 ENCOUNTER — Ambulatory Visit (INDEPENDENT_AMBULATORY_CARE_PROVIDER_SITE_OTHER): Payer: 59 | Admitting: Family Medicine

## 2015-09-27 VITALS — BP 112/78 | HR 100 | Temp 98.4°F | Wt 304.2 lb

## 2015-09-27 DIAGNOSIS — G8929 Other chronic pain: Secondary | ICD-10-CM

## 2015-09-27 DIAGNOSIS — M545 Low back pain: Secondary | ICD-10-CM

## 2015-09-27 DIAGNOSIS — Z6841 Body Mass Index (BMI) 40.0 and over, adult: Secondary | ICD-10-CM

## 2015-09-27 MED ORDER — DILTIAZEM HCL ER COATED BEADS 180 MG PO CP24: 180.0000 mg | ORAL_CAPSULE | Freq: Every day | ORAL | Status: DC

## 2015-09-27 MED ORDER — SITAGLIPTIN PHOSPHATE 50 MG PO TABS: 50.0000 mg | ORAL_TABLET | Freq: Every day | ORAL | Status: DC

## 2015-09-27 NOTE — Assessment & Plan Note (Signed)
Reviewing chart, has been approved to see Dr Dossie Arbour but has not been contacted yet. I will have our referral coordinator call to schedule appointment. Pt agrees with plan. No charge visit today.

## 2015-09-27 NOTE — Progress Notes (Signed)
Pre visit review using our clinic review tool, if applicable. No additional management support is needed unless otherwise documented below in the visit note. 

## 2015-09-27 NOTE — Assessment & Plan Note (Signed)
Insurance did not approve xenical.  Pt has cut out most sodas from diet. Has increased water and joined gym. Motivated to continue slow but steady weight loss program.

## 2015-09-27 NOTE — Progress Notes (Signed)
BP 112/78 mmHg  Pulse 100  Temp(Src) 98.4 F (36.9 C) (Oral)  Wt 304 lb 4 oz (138.007 kg)   CC: chronic back pain and joint pains  Subjective:    Patient ID: PATT ANCAR, female    DOB: [birth year], 55 y.o.   MRN: [id number]  HPI: Kathleen Good is a 55 y.o. female presenting on 09/27/2015 for Back Pain and Joint Pain   See prior note for details. Last visit we started xenical - insurance didn't cover. Pt unable to afford OTC orlistat. Unable to afford bariatric surgery. Not good candidate for other bariatric medication. She has joined gym and has started exercise regimen.   Last visit I referred to pain clinic. Dr Primus Bravo and Andree Elk declined pt.  Does not want to see chiropractor. Has seen integrative therapies in the past which was helpful, may return to them.   Has been sent for MRI of L knee Delilah Shan with [place] [facility]).  Chronic lower back pain - has received ESI and RF ablation by Dr Jacelyn Grip. Has seen [place]. She has completed physical therapy. Has declined further ESI. Back pain limits daily activities - unable to walk at [place] for more than 15 min at a time.   CERVICAL FUSION Date: 11/2003 C5-C6 DDD (degenerative disc disease), cervical w/ occipital neuralgia, s/p bilateral upper cervical facet injections DDD (degenerative disc disease), lumbar Date: 2016 mild DDD by xray, chronic disc space loss L5/S1, facet hypertrophy by MRI pending facet injection Jacelyn Grip)  Relevant past medical, surgical, family and social history reviewed and updated as indicated. Interim medical history since our last visit reviewed. Allergies and medications reviewed and updated. Current Outpatient Prescriptions on File Prior to Visit  Medication Sig  . aspirin 81 MG EC tablet Take 81 mg by mouth daily.    Marland Kitchen atorvastatin (LIPITOR) 40 MG tablet Take 1 tablet (40 mg total) by mouth daily.  . Cholecalciferol ([employer] VITAMIN D3) 1000 UNITS capsule Take 1 capsule (1,000 Units total) by mouth daily.  . clobetasol  cream (TEMOVATE) [id number] % Apply 1 application topically 2 (two) times daily. Apply to AA  . diltiazem (CARTIA XT) 180 MG 24 hr capsule Take 1 capsule (180 mg total) by mouth daily.  Marland Kitchen ezetimibe (ZETIA) 10 MG tablet Take 1 tablet (10 mg total) by mouth daily.  Marland Kitchen FLUoxetine (PROZAC) 20 MG capsule Take 1 capsule (20 mg total) by mouth daily. In AM  . FLUoxetine (PROZAC) 40 MG capsule Take 1 capsule (40 mg total) by mouth daily.  . hydrochlorothiazide (HYDRODIURIL) 25 MG tablet Take 1 tablet (25 mg total) by mouth daily.  . methocarbamol (ROBAXIN) 500 MG tablet Take 1-2 tablets (500-1,000 mg total) by mouth 3 (three) times daily as needed for muscle spasms (sedation precautions).  . metoprolol succinate (TOPROL-XL) 100 MG 24 hr tablet Take 100 mg by mouth 2 (two) times daily with a meal. Take with or immediately following a meal.  . nitroGLYCERIN (NITROSTAT) 0.4 MG SL tablet Place 1 tablet (0.4 mg total) under the tongue every 5 (five) minutes as needed for chest pain. If needed recommend ER evaluation  . omeprazole (PRILOSEC) 20 MG capsule Take 1 capsule (20 mg total) by mouth 2 (two) times daily.  Marland Kitchen orlistat (XENICAL) 120 MG capsule Take 1 capsule (120 mg total) by mouth 3 (three) times daily with meals.  . propranolol (INDERAL) 20 MG tablet Take 1 tablet (20 mg total) by mouth 3 (three) times daily as needed.  . sitaGLIPtin (JANUVIA) 50  MG tablet Take 1 tablet (50 mg total) by mouth daily.  Marland Kitchen triamcinolone ointment (KENALOG) 0.5 % Apply 1 application topically 2 (two) times daily. 2 times a day as needed  . vitamin B-12 (CYANOCOBALAMIN) 1000 MCG tablet Take 1 tablet (1,000 mcg total) by mouth daily.  . [DISCONTINUED] metFORMIN (GLUCOPHAGE) 500 MG tablet Take 1 tablet (500 mg total) by mouth 2 (two) times daily with a meal.   No current facility-administered medications on file prior to visit.    Review of Systems Per HPI unless specifically indicated in ROS section     Objective:    BP 112/78  mmHg  Pulse 100  Temp(Src) 98.4 F (36.9 C) (Oral)  Wt 304 lb 4 oz (138.007 kg)  Wt Readings from Last 3 Encounters:  09/27/15 304 lb 4 oz (138.007 kg)  08/26/15 305 lb 4 oz (138.46 kg)  04/14/15 306 lb 4 oz (138.914 kg)   Body mass index is 50.63 kg/(m^2).  Physical Exam Results for orders placed or performed in visit on 08/14/15  HM DIABETES EYE EXAM  Result Value Ref Range   HM Diabetic Eye Exam No Retinopathy No Retinopathy   MRI LUMBAR SPINE WITHOUT CONTRAST TECHNIQUE: Multiplanar, multisequence MR imaging of the lumbar spine was performed. No intravenous contrast was administered. COMPARISON: Lumbar radiographs 01/28/2015 FINDINGS: Normal lumbar alignment. Negative for fracture or mass. No bone marrow edema. Conus medullaris normal and terminates at L1-2 L1-2: Negative L2-3: Normal disc. Mild facet degeneration without spinal or foraminal stenosis L3-4: Normal disc. Mild facet degeneration without spinal or foraminal stenosis L4-5: Mild disc degeneration. Moderate facet hypertrophy and ligamentum flavum hypertrophy causing mild spinal stenosis. Neural foramina are patent L5-S1: Mild facet degeneration with without spinal or foraminal encroachment. IMPRESSION: Lumbar facet degeneration diffusely. Mild spinal stenosis L4-5. No focal disc protrusion. Electronically Signed  By: Franchot Gallo M.D.  On: 02/27/2015 11:50     Assessment & Plan:   Problem List Items Addressed This Visit    Morbid obesity with BMI of 50.0-59.9, adult ([place])    Insurance did not approve xenical.  Pt has cut out most sodas from diet. Has increased water and joined gym. Motivated to continue slow but steady weight loss program.       Chronic low back pain - Primary    Reviewing chart, has been approved to see Dr Dossie Arbour but has not been contacted yet. I will have our referral coordinator call to schedule appointment. Pt agrees with plan. No charge visit today.           Follow up  plan: No Follow-up on file.  Ria Bush, MD

## 2015-09-27 NOTE — Patient Instructions (Signed)
Consider returning to integrative therapies. Pass by Marion's office to check on pain clinic referral.

## 2015-10-30 ENCOUNTER — Other Ambulatory Visit: Payer: Self-pay | Admitting: Family Medicine

## 2015-10-31 NOTE — Telephone Encounter (Signed)
Per Loran Senters is now $50. She needs a substitution.

## 2015-11-01 ENCOUNTER — Other Ambulatory Visit: Payer: Self-pay | Admitting: *Deleted

## 2015-11-01 MED ORDER — BLOOD GLUCOSE MONITORING SUPPL KIT: PACK | Status: DC

## 2015-11-01 MED ORDER — GLUCOSE BLOOD VI STRP: 1.0000 | ORAL_STRIP | Freq: Every day | Status: DC

## 2015-11-01 MED ORDER — GLIMEPIRIDE 2 MG PO TABS: 2.0000 mg | ORAL_TABLET | Freq: Every day | ORAL | Status: DC

## 2015-11-01 MED ORDER — BLOOD GLUCOSE METER KIT: 1.0000 | PACK | Freq: Every day | Status: DC

## 2015-11-01 MED ORDER — LANCETS MISC: Status: DC

## 2015-11-01 NOTE — Addendum Note (Signed)
Addended by: Royann Shivers A on: 11/01/2015 03:27 PM   Modules accepted: Orders, Medications

## 2015-11-01 NOTE — Telephone Encounter (Signed)
Start amaryl (glimepiride) 2mg  daily Needs to take with breakfast, needs to monitor sugars closely initially to ensure no low sugars (this medicine can drop sugars).  Keep f/u in June, return sooner as needed.

## 2015-11-01 NOTE — Telephone Encounter (Signed)
Patient notified and verbalized understanding. She said she didn't have a glucometer to monitor sugars. Sent in kit to pharmacy.

## 2015-11-02 ENCOUNTER — Encounter: Payer: Self-pay | Admitting: Family Medicine

## 2015-11-03 ENCOUNTER — Ambulatory Visit: Payer: 59 | Admitting: Pain Medicine

## 2015-11-03 ENCOUNTER — Telehealth: Payer: Self-pay | Admitting: *Deleted

## 2015-11-03 NOTE — Telephone Encounter (Signed)
Returned the pt called due to her appt being cancelled and she wanted to r/s. Gave appt for 11/24/15@ 7:40 am pt is aware...td

## 2015-11-24 ENCOUNTER — Encounter: Payer: Self-pay | Admitting: Pain Medicine

## 2015-11-24 ENCOUNTER — Ambulatory Visit: Payer: 59 | Attending: Pain Medicine | Admitting: Pain Medicine

## 2015-11-24 VITALS — BP 123/102 | HR 96 | Temp 98.4°F | Resp 18 | Ht 65.0 in | Wt 306.0 lb

## 2015-11-24 DIAGNOSIS — N644 Mastodynia: Secondary | ICD-10-CM | POA: Insufficient documentation

## 2015-11-24 DIAGNOSIS — R011 Cardiac murmur, unspecified: Secondary | ICD-10-CM | POA: Diagnosis not present

## 2015-11-24 DIAGNOSIS — M25569 Pain in unspecified knee: Secondary | ICD-10-CM | POA: Diagnosis present

## 2015-11-24 DIAGNOSIS — M4722 Other spondylosis with radiculopathy, cervical region: Secondary | ICD-10-CM

## 2015-11-24 DIAGNOSIS — J45909 Unspecified asthma, uncomplicated: Secondary | ICD-10-CM | POA: Diagnosis not present

## 2015-11-24 DIAGNOSIS — M170 Bilateral primary osteoarthritis of knee: Secondary | ICD-10-CM | POA: Diagnosis not present

## 2015-11-24 DIAGNOSIS — K219 Gastro-esophageal reflux disease without esophagitis: Secondary | ICD-10-CM | POA: Insufficient documentation

## 2015-11-24 DIAGNOSIS — Z5181 Encounter for therapeutic drug level monitoring: Secondary | ICD-10-CM

## 2015-11-24 DIAGNOSIS — G4733 Obstructive sleep apnea (adult) (pediatric): Secondary | ICD-10-CM | POA: Diagnosis not present

## 2015-11-24 DIAGNOSIS — M25511 Pain in right shoulder: Secondary | ICD-10-CM

## 2015-11-24 DIAGNOSIS — Z87891 Personal history of nicotine dependence: Secondary | ICD-10-CM | POA: Insufficient documentation

## 2015-11-24 DIAGNOSIS — M25559 Pain in unspecified hip: Secondary | ICD-10-CM | POA: Insufficient documentation

## 2015-11-24 DIAGNOSIS — N6019 Diffuse cystic mastopathy of unspecified breast: Secondary | ICD-10-CM | POA: Diagnosis not present

## 2015-11-24 DIAGNOSIS — G8929 Other chronic pain: Secondary | ICD-10-CM | POA: Diagnosis not present

## 2015-11-24 DIAGNOSIS — R079 Chest pain, unspecified: Secondary | ICD-10-CM | POA: Insufficient documentation

## 2015-11-24 DIAGNOSIS — M25512 Pain in left shoulder: Secondary | ICD-10-CM | POA: Insufficient documentation

## 2015-11-24 DIAGNOSIS — R52 Pain, unspecified: Secondary | ICD-10-CM | POA: Insufficient documentation

## 2015-11-24 DIAGNOSIS — M47816 Spondylosis without myelopathy or radiculopathy, lumbar region: Secondary | ICD-10-CM

## 2015-11-24 DIAGNOSIS — M419 Scoliosis, unspecified: Secondary | ICD-10-CM | POA: Insufficient documentation

## 2015-11-24 DIAGNOSIS — L739 Follicular disorder, unspecified: Secondary | ICD-10-CM | POA: Insufficient documentation

## 2015-11-24 DIAGNOSIS — Z6841 Body Mass Index (BMI) 40.0 and over, adult: Secondary | ICD-10-CM | POA: Diagnosis not present

## 2015-11-24 DIAGNOSIS — Z86010 Personal history of colonic polyps: Secondary | ICD-10-CM | POA: Insufficient documentation

## 2015-11-24 DIAGNOSIS — E538 Deficiency of other specified B group vitamins: Secondary | ICD-10-CM

## 2015-11-24 DIAGNOSIS — R51 Headache: Secondary | ICD-10-CM

## 2015-11-24 DIAGNOSIS — M2578 Osteophyte, vertebrae: Secondary | ICD-10-CM | POA: Diagnosis not present

## 2015-11-24 DIAGNOSIS — D649 Anemia, unspecified: Secondary | ICD-10-CM | POA: Diagnosis not present

## 2015-11-24 DIAGNOSIS — Z981 Arthrodesis status: Secondary | ICD-10-CM | POA: Insufficient documentation

## 2015-11-24 DIAGNOSIS — M25561 Pain in right knee: Secondary | ICD-10-CM

## 2015-11-24 DIAGNOSIS — M47892 Other spondylosis, cervical region: Secondary | ICD-10-CM | POA: Diagnosis not present

## 2015-11-24 DIAGNOSIS — M545 Low back pain: Secondary | ICD-10-CM

## 2015-11-24 DIAGNOSIS — E559 Vitamin D deficiency, unspecified: Secondary | ICD-10-CM

## 2015-11-24 DIAGNOSIS — Z0189 Encounter for other specified special examinations: Secondary | ICD-10-CM

## 2015-11-24 DIAGNOSIS — M4806 Spinal stenosis, lumbar region: Secondary | ICD-10-CM | POA: Diagnosis not present

## 2015-11-24 DIAGNOSIS — F329 Major depressive disorder, single episode, unspecified: Secondary | ICD-10-CM | POA: Diagnosis not present

## 2015-11-24 DIAGNOSIS — M47812 Spondylosis without myelopathy or radiculopathy, cervical region: Secondary | ICD-10-CM

## 2015-11-24 DIAGNOSIS — M533 Sacrococcygeal disorders, not elsewhere classified: Secondary | ICD-10-CM | POA: Diagnosis not present

## 2015-11-24 DIAGNOSIS — G4486 Cervicogenic headache: Secondary | ICD-10-CM

## 2015-11-24 DIAGNOSIS — M797 Fibromyalgia: Secondary | ICD-10-CM | POA: Insufficient documentation

## 2015-11-24 DIAGNOSIS — M25562 Pain in left knee: Secondary | ICD-10-CM

## 2015-11-24 DIAGNOSIS — M542 Cervicalgia: Secondary | ICD-10-CM | POA: Insufficient documentation

## 2015-11-24 DIAGNOSIS — M549 Dorsalgia, unspecified: Secondary | ICD-10-CM

## 2015-11-24 DIAGNOSIS — M47896 Other spondylosis, lumbar region: Secondary | ICD-10-CM

## 2015-11-24 DIAGNOSIS — Z9889 Other specified postprocedural states: Secondary | ICD-10-CM

## 2015-11-24 DIAGNOSIS — M79601 Pain in right arm: Secondary | ICD-10-CM

## 2015-11-24 DIAGNOSIS — E1165 Type 2 diabetes mellitus with hyperglycemia: Secondary | ICD-10-CM | POA: Diagnosis not present

## 2015-11-24 DIAGNOSIS — R000 Tachycardia, unspecified: Secondary | ICD-10-CM | POA: Insufficient documentation

## 2015-11-24 DIAGNOSIS — M5382 Other specified dorsopathies, cervical region: Secondary | ICD-10-CM

## 2015-11-24 DIAGNOSIS — R21 Rash and other nonspecific skin eruption: Secondary | ICD-10-CM | POA: Insufficient documentation

## 2015-11-24 DIAGNOSIS — G4700 Insomnia, unspecified: Secondary | ICD-10-CM | POA: Diagnosis not present

## 2015-11-24 DIAGNOSIS — M5481 Occipital neuralgia: Secondary | ICD-10-CM

## 2015-11-24 DIAGNOSIS — M4802 Spinal stenosis, cervical region: Secondary | ICD-10-CM | POA: Diagnosis not present

## 2015-11-24 DIAGNOSIS — I10 Essential (primary) hypertension: Secondary | ICD-10-CM | POA: Insufficient documentation

## 2015-11-24 DIAGNOSIS — E785 Hyperlipidemia, unspecified: Secondary | ICD-10-CM | POA: Diagnosis not present

## 2015-11-24 DIAGNOSIS — F1190 Opioid use, unspecified, uncomplicated: Secondary | ICD-10-CM

## 2015-11-24 DIAGNOSIS — M5412 Radiculopathy, cervical region: Secondary | ICD-10-CM

## 2015-11-24 DIAGNOSIS — R5383 Other fatigue: Secondary | ICD-10-CM | POA: Diagnosis not present

## 2015-11-24 NOTE — Patient Instructions (Signed)
Patient understands all discharge instructions and denies any questions at this time. Patient instructed to call for any questions or concerns re today's visit.

## 2015-11-24 NOTE — Progress Notes (Signed)
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 Right lower extremity  Side: Left lower extremity  Inspection: No masses, redness, swelling, or asymmetry ROM:  Inspection: No masses, redness, swelling, or asymmetry ROM:  Functional: Limited ROM for the hip and knee joint.   Functional: Limited ROM for the hip and knee joint.   Active: Decreased ROM for the hip and knee joint.   Active: Decreased ROM for the hip and knee joint.   Muscle strength & Tone: Functionally intact  Muscle strength & Tone: Functionally intact  Sensory: Unimpaired  Sensory: Unimpaired  Palpation: Non-contributory  Palpation: Non-contributory   Assessment  Primary Diagnosis & Pertinent Problem List: The primary encounter diagnosis was Chronic pain. Diagnoses of Lumbar facet hypertrophy, Lumbar facet syndrome, Chronic neck pain (Right), Chronic cervical radicular pain (Right), Chronic shoulder radicular pain (Right), Chronic shoulder pain (Right), Chronic upper extremity pain (Right), Chronic hip pain, unspecified laterality, Cervical spondylosis with radiculopathy (Right),  Lumbar spondylosis, unspecified spinal osteoarthritis, Opiate use, Encounter for therapeutic drug level monitoring, Encounter for pain management planning, Vitamin B12 deficiency, Vitamin D deficiency, Chronic sacroiliac joint pain, Chronic pain of left knee, Chronic knee pain (Location of Tertiary source of pain) (Bilateral) (L>R), Primary osteoarthritis of both knees, Cervicogenic headache (Occipital) (Bilateral), Occipital neuralgia (Bilateral) (R>L), Hx of cervical spine surgery (ACDF in 2006 by Dr. Louanne Skye), Cervical facet syndrome, and Morbid obesity with BMI of 50.0-59.9, adult [facility]) were also pertinent to this visit.  Visit Diagnosis: 1. Chronic pain   2. Lumbar facet hypertrophy   3. Lumbar facet syndrome   4. Chronic neck pain (Right)   5. Chronic cervical radicular pain (Right)   6. Chronic shoulder radicular pain (Right)   7. Chronic shoulder pain (Right)   8. Chronic upper extremity pain (Right)   9. Chronic hip pain, unspecified laterality   10. Cervical spondylosis with radiculopathy (Right)   11. Lumbar spondylosis, unspecified spinal osteoarthritis   12. Opiate use   13. Encounter for therapeutic drug level monitoring   14. Encounter for pain management planning   15. Vitamin B12 deficiency   16. Vitamin D deficiency   17. Chronic sacroiliac joint pain   18. Chronic pain of left knee   19. Chronic knee pain (Location of Tertiary source of pain) (Bilateral) (L>R)   20. Primary osteoarthritis of both knees   21. Cervicogenic headache (Occipital) (Bilateral)   22. Occipital neuralgia (Bilateral) (R>L)   23. Hx of cervical spine surgery (ACDF in 2006 by Dr. Louanne Skye)   24. Cervical facet syndrome   25. Morbid obesity with BMI of 50.0-59.9, adult [facility])     Assessment: No problem-specific assessment & plan notes found for this encounter.   Plan of Care  Initial Treatment Plan:  Please be advised that as per protocol, today's visit has been an evaluation only. We have not  taken over the patient's controlled substance management.  Problem List Items Addressed This Visit      High   Cervical facet syndrome (Chronic)   Cervical spondylosis with radiculopathy (C8) (Right) (Chronic)   Cervicogenic headache (Occipital) (Bilateral) (Chronic)   Chronic cervical radicular pain (C8) (Right) (  Chronic)   Chronic hip pain (Bilateral) (Chronic)   Relevant Orders   DG HIP UNILAT W OR W/O PELVIS 2-3 VIEWS LEFT   DG HIP UNILAT W OR W/O PELVIS 2-3 VIEWS RIGHT   Chronic knee pain (Location of Tertiary source of pain) (Bilateral) (L>R) (Chronic)   Chronic neck pain (Location of Secondary source of pain) (Bilateral) (R>L) (Chronic)   Chronic pain - Primary (Chronic)   Relevant Orders   Comprehensive metabolic panel   C-reactive protein   Magnesium   Sedimentation rate   Vitamin B12   25-Hydroxyvitamin D Lcms D2+D3   Chronic pain of left knee (Chronic)   Relevant Orders   DG Knee 1-2 Views Left   Chronic sacroiliac joint pain (Bilateral) (L>R) (Chronic)   Relevant Orders   DG Si Joints   Chronic shoulder pain (Bilateral) (R>L) (Chronic)   Chronic shoulder radicular pain (Right) (Chronic)   Chronic upper extremity pain (Right) (Chronic)   Hx of cervical spine surgery (ACDF in 2006 by Dr. Louanne Skye)   Lumbar facet hypertrophy (Chronic)   Lumbar facet syndrome (Location of Primary Source of Pain) (Bilateral) (L>R) (Chronic)   Lumbar spondylosis (Chronic)   Occipital neuralgia (Bilateral) (R>L) (Chronic)   Osteoarthritis of knee (Bilateral) (L>R) (Chronic)     Medium   Encounter for pain management planning   Encounter for therapeutic drug level monitoring   Opiate use (24 MME/Day)   Relevant Orders   Compliance Drug Analysis, Ur   Ambulatory referral to Psychology     Low   Morbid obesity with BMI of 50.0-59.9, adult (HCC) (Chronic)   Relevant Orders   Amb ref to Medical Nutrition Therapy-MNT   Ambulatory referral to General Surgery   Vitamin B12 deficiency    Relevant Orders   Vitamin B12   Vitamin D deficiency   Relevant Orders   25-Hydroxyvitamin D Lcms D2+D3      Pharmacotherapy (Medications Ordered): No orders of the defined types were placed in this encounter.    Lab-work & Procedure Ordered: Orders Placed This Encounter  Procedures  . DG HIP UNILAT W OR W/O PELVIS 2-3 VIEWS LEFT  . DG HIP UNILAT W OR W/O PELVIS 2-3 VIEWS RIGHT  . DG Knee 1-2 Views Left  . DG Si Joints  . Compliance Drug Analysis, Ur  . Comprehensive metabolic panel  . C-reactive protein  . Magnesium  . Sedimentation rate  . Vitamin B12  . 25-Hydroxyvitamin D Lcms D2+D3  . Ambulatory referral to Psychology  . Amb ref to Medical Nutrition Therapy-MNT  . Ambulatory referral to General Surgery    Imaging Ordered: AMB REFERRAL TO PSYCHOLOGY AMB REFERRAL TO MEDICAL NUTRITION THERAPY (MNT) AMB REFERRAL TO GENERAL SURGERY DG HIP UNILAT W OR W/O PELVIS 2-3 VIEWS LEFT DG HIP UNILAT W OR W/O PELVIS 2-3 VIEWS RIGHT DG KNEE 1-2 VIEWS LEFT DG SI JOINTS  Interventional Therapies: Scheduled:  None at this time.    Considering:  None at this time. The patient was informed that she would need to bring her BMI to 35 or less so that we can provide her some benefit with interventional therapies.    PRN Procedures:  None at this time.    Referral(s) or Consult(s): Medical psychology consult for substance use disorder evaluation  Medications administered during this visit: Ms. Derstine had no medications administered during this visit.  Prescriptions ordered during this visit: New Prescriptions   No medications on file    Requested PM Follow-up: Return for Return after MedPsych (  SUD) Eval.  Future Appointments Date Time Provider [place]  12/08/2015 9:15 AM Ria Bush, MD [id number]     Primary Care Physician: Ria Bush, MD Location: [facility] Note by: Kathlen Brunswick. Dossie Arbour, M.D, DABA, DABAPM,  DABPM, DABIPP, FIPP  Pain Score Disclaimer: We use the NRS-11 scale. This is a self-reported, subjective measurement of pain severity with only modest accuracy. It is used primarily to identify changes within a particular patient. It must be understood that outpatient pain scales are significantly less accurate that those used for research, where they can be applied under ideal controlled circumstances with minimal exposure to variables. In reality, the score is likely to be a combination of pain intensity and pain affect, where pain affect describes the degree of emotional arousal or changes in action readiness caused by the sensory experience of pain. Factors such as social and work situation, setting, emotional state, anxiety levels, expectation, and prior pain experience may influence pain perception and show large inter-individual differences that may also be affected by time variables.  Patient instructions provided during this appointment: Patient Instructions  Patient understands all discharge instructions and denies any questions at this time. Patient instructed to call for any questions or concerns re today's visit.

## 2015-11-24 NOTE — Progress Notes (Signed)
Safety precautions to be maintained throughout the outpatient stay will include: orient to surroundings, keep bed in low position, maintain call bell within reach at all times, provide assistance with transfer out of bed and ambulation.  

## 2015-11-25 ENCOUNTER — Other Ambulatory Visit: Payer: Self-pay | Admitting: Family Medicine

## 2015-12-01 LAB — COMPLIANCE DRUG ANALYSIS, UR: PDF: 0

## 2015-12-08 ENCOUNTER — Encounter: Payer: 59 | Admitting: Family Medicine

## 2015-12-14 ENCOUNTER — Telehealth: Payer: Self-pay

## 2015-12-14 NOTE — Telephone Encounter (Signed)
Sent Medical records to [facility] ortho/ Dr Nelva Bush pt signed release form. 12/08/2015

## 2015-12-15 NOTE — Telephone Encounter (Signed)
Please take care of this. This should not be sent to me.

## 2016-02-08 ENCOUNTER — Other Ambulatory Visit: Payer: Self-pay | Admitting: Family Medicine

## 2016-02-08 DIAGNOSIS — E559 Vitamin D deficiency, unspecified: Secondary | ICD-10-CM

## 2016-02-08 DIAGNOSIS — I10 Essential (primary) hypertension: Secondary | ICD-10-CM

## 2016-02-08 DIAGNOSIS — IMO0001 Reserved for inherently not codable concepts without codable children: Secondary | ICD-10-CM

## 2016-02-08 DIAGNOSIS — E538 Deficiency of other specified B group vitamins: Secondary | ICD-10-CM

## 2016-02-08 DIAGNOSIS — E1165 Type 2 diabetes mellitus with hyperglycemia: Principal | ICD-10-CM

## 2016-02-08 DIAGNOSIS — Z1159 Encounter for screening for other viral diseases: Secondary | ICD-10-CM

## 2016-02-08 DIAGNOSIS — E785 Hyperlipidemia, unspecified: Secondary | ICD-10-CM

## 2016-02-09 ENCOUNTER — Other Ambulatory Visit: Payer: 59

## 2016-02-14 ENCOUNTER — Encounter: Payer: 59 | Admitting: Family Medicine

## 2016-03-08 ENCOUNTER — Other Ambulatory Visit: Payer: 59

## 2016-03-12 ENCOUNTER — Encounter: Payer: 59 | Admitting: Family Medicine

## 2016-03-30 ENCOUNTER — Other Ambulatory Visit: Payer: Self-pay | Admitting: Family Medicine

## 2016-04-05 ENCOUNTER — Telehealth: Payer: Self-pay | Admitting: Cardiovascular Disease

## 2016-04-05 NOTE — Telephone Encounter (Signed)
3 ttempts to schedule fu from recall list. No ans no vm.   Deleting recall.

## 2016-04-18 ENCOUNTER — Encounter: Payer: Self-pay | Admitting: Family Medicine

## 2016-06-14 ENCOUNTER — Encounter: Payer: Self-pay | Admitting: Family Medicine

## 2016-06-14 MED ORDER — FLUCONAZOLE 150 MG PO TABS: 150.0000 mg | ORAL_TABLET | Freq: Every day | ORAL | 0 refills | Status: DC

## 2016-06-22 ENCOUNTER — Other Ambulatory Visit (INDEPENDENT_AMBULATORY_CARE_PROVIDER_SITE_OTHER): Payer: 59

## 2016-06-22 DIAGNOSIS — E785 Hyperlipidemia, unspecified: Secondary | ICD-10-CM | POA: Diagnosis not present

## 2016-06-22 DIAGNOSIS — IMO0001 Reserved for inherently not codable concepts without codable children: Secondary | ICD-10-CM

## 2016-06-22 DIAGNOSIS — I10 Essential (primary) hypertension: Secondary | ICD-10-CM

## 2016-06-22 DIAGNOSIS — E559 Vitamin D deficiency, unspecified: Secondary | ICD-10-CM | POA: Diagnosis not present

## 2016-06-22 DIAGNOSIS — E784 Other hyperlipidemia: Secondary | ICD-10-CM | POA: Diagnosis not present

## 2016-06-22 DIAGNOSIS — E1165 Type 2 diabetes mellitus with hyperglycemia: Secondary | ICD-10-CM | POA: Diagnosis not present

## 2016-06-22 DIAGNOSIS — Z1159 Encounter for screening for other viral diseases: Secondary | ICD-10-CM

## 2016-06-22 DIAGNOSIS — E538 Deficiency of other specified B group vitamins: Secondary | ICD-10-CM | POA: Diagnosis not present

## 2016-06-22 DIAGNOSIS — E7849 Other hyperlipidemia: Secondary | ICD-10-CM

## 2016-06-22 LAB — LIPID PANEL
Cholesterol: 276 mg/dL — ABNORMAL HIGH (ref 0–200)
HDL: 40.1 mg/dL (ref >39.00)
LDL Cholesterol: 201 mg/dL — ABNORMAL HIGH (ref 0–99)
NonHDL: 235.49
Total CHOL/HDL Ratio: 7
Triglycerides: 171 mg/dL — ABNORMAL HIGH (ref 0.0–149.0)
VLDL: 34.2 mg/dL (ref 0.0–40.0)

## 2016-06-22 LAB — BASIC METABOLIC PANEL
BUN: 9 mg/dL (ref 6–23)
CO2: 30 mEq/L (ref 19–32)
Calcium: 8.8 mg/dL (ref 8.4–10.5)
Chloride: 102 mEq/L (ref 96–112)
Creatinine, Ser: 0.85 mg/dL (ref 0.40–1.20)
GFR: 74.23 mL/min (ref >60.00)
Glucose, Bld: 179 mg/dL — ABNORMAL HIGH (ref 70–99)
Potassium: 4.2 mEq/L (ref 3.5–5.1)
Sodium: 139 mEq/L (ref 135–145)

## 2016-06-22 LAB — HEMOGLOBIN A1C: Hgb A1c MFr Bld: 9.9 % — ABNORMAL HIGH (ref 4.6–6.5)

## 2016-06-22 LAB — MICROALBUMIN / CREATININE URINE RATIO
Creatinine,U: 281.5 mg/dL
Microalb Creat Ratio: 2.6 mg/g (ref 0.0–30.0)
Microalb, Ur: 7.4 mg/dL — ABNORMAL HIGH (ref 0.0–1.9)

## 2016-06-22 LAB — VITAMIN B12: Vitamin B-12: 207 pg/mL — ABNORMAL LOW (ref 211–911)

## 2016-06-22 LAB — VITAMIN D 25 HYDROXY (VIT D DEFICIENCY, FRACTURES): VITD: 22.44 ng/mL — ABNORMAL LOW (ref 30.00–100.00)

## 2016-06-23 LAB — HEPATITIS C ANTIBODY: HCV Ab: NEGATIVE

## 2016-06-27 ENCOUNTER — Encounter: Payer: 59 | Admitting: Family Medicine

## 2016-07-02 DIAGNOSIS — M545 Low back pain: Secondary | ICD-10-CM | POA: Diagnosis not present

## 2016-07-20 ENCOUNTER — Observation Stay (HOSPITAL_BASED_OUTPATIENT_CLINIC_OR_DEPARTMENT_OTHER)
Admission: EM | Admit: 2016-07-20 | Discharge: 2016-07-21 | Disposition: A | Discharge status: Home / Self Care | Payer: 59 | Attending: Internal Medicine | Admitting: Internal Medicine

## 2016-07-20 ENCOUNTER — Emergency Department (HOSPITAL_BASED_OUTPATIENT_CLINIC_OR_DEPARTMENT_OTHER): Payer: 59

## 2016-07-20 ENCOUNTER — Encounter (HOSPITAL_BASED_OUTPATIENT_CLINIC_OR_DEPARTMENT_OTHER): Payer: Self-pay | Admitting: *Deleted

## 2016-07-20 DIAGNOSIS — Z7984 Long term (current) use of oral hypoglycemic drugs: Secondary | ICD-10-CM | POA: Insufficient documentation

## 2016-07-20 DIAGNOSIS — F329 Major depressive disorder, single episode, unspecified: Secondary | ICD-10-CM | POA: Insufficient documentation

## 2016-07-20 DIAGNOSIS — K219 Gastro-esophageal reflux disease without esophagitis: Secondary | ICD-10-CM | POA: Insufficient documentation

## 2016-07-20 DIAGNOSIS — J9801 Acute bronchospasm: Secondary | ICD-10-CM | POA: Diagnosis not present

## 2016-07-20 DIAGNOSIS — J209 Acute bronchitis, unspecified: Secondary | ICD-10-CM | POA: Diagnosis not present

## 2016-07-20 DIAGNOSIS — E785 Hyperlipidemia, unspecified: Secondary | ICD-10-CM | POA: Diagnosis not present

## 2016-07-20 DIAGNOSIS — R69 Illness, unspecified: Secondary | ICD-10-CM

## 2016-07-20 DIAGNOSIS — Z6841 Body Mass Index (BMI) 40.0 and over, adult: Secondary | ICD-10-CM | POA: Insufficient documentation

## 2016-07-20 DIAGNOSIS — E119 Type 2 diabetes mellitus without complications: Secondary | ICD-10-CM | POA: Diagnosis not present

## 2016-07-20 DIAGNOSIS — R509 Fever, unspecified: Secondary | ICD-10-CM | POA: Diagnosis present

## 2016-07-20 DIAGNOSIS — J111 Influenza due to unidentified influenza virus with other respiratory manifestations: Secondary | ICD-10-CM

## 2016-07-20 DIAGNOSIS — Z79899 Other long term (current) drug therapy: Secondary | ICD-10-CM | POA: Insufficient documentation

## 2016-07-20 DIAGNOSIS — E876 Hypokalemia: Secondary | ICD-10-CM | POA: Diagnosis not present

## 2016-07-20 DIAGNOSIS — J9601 Acute respiratory failure with hypoxia: Secondary | ICD-10-CM | POA: Diagnosis not present

## 2016-07-20 DIAGNOSIS — Z7982 Long term (current) use of aspirin: Secondary | ICD-10-CM | POA: Diagnosis not present

## 2016-07-20 DIAGNOSIS — I10 Essential (primary) hypertension: Secondary | ICD-10-CM | POA: Diagnosis not present

## 2016-07-20 DIAGNOSIS — R05 Cough: Secondary | ICD-10-CM | POA: Diagnosis not present

## 2016-07-20 DIAGNOSIS — R6889 Other general symptoms and signs: Secondary | ICD-10-CM | POA: Diagnosis present

## 2016-07-20 DIAGNOSIS — R0902 Hypoxemia: Secondary | ICD-10-CM

## 2016-07-20 DIAGNOSIS — Z87891 Personal history of nicotine dependence: Secondary | ICD-10-CM | POA: Insufficient documentation

## 2016-07-20 LAB — COMPREHENSIVE METABOLIC PANEL
ALT: 33 U/L (ref 14–54)
AST: 41 U/L (ref 15–41)
Albumin: 4.2 g/dL (ref 3.5–5.0)
Alkaline Phosphatase: 88 U/L (ref 38–126)
Anion gap: 14 (ref 5–15)
BUN: 8 mg/dL (ref 6–20)
CO2: 21 mmol/L — ABNORMAL LOW (ref 22–32)
Calcium: 8.7 mg/dL — ABNORMAL LOW (ref 8.9–10.3)
Chloride: 101 mmol/L (ref 101–111)
Creatinine, Ser: 0.93 mg/dL (ref 0.44–1.00)
GFR calc Af Amer: >60 mL/min (ref >60)
GFR calc non Af Amer: >60 mL/min (ref >60)
Glucose, Bld: 181 mg/dL — ABNORMAL HIGH (ref 65–99)
Potassium: 3.4 mmol/L — ABNORMAL LOW (ref 3.5–5.1)
Sodium: 136 mmol/L (ref 135–145)
Total Bilirubin: 0.5 mg/dL (ref 0.3–1.2)
Total Protein: 8.6 g/dL — ABNORMAL HIGH (ref 6.5–8.1)

## 2016-07-20 LAB — CBC WITH DIFFERENTIAL/PLATELET
Basophils Absolute: 0 10*3/uL (ref 0.0–0.1)
Basophils Relative: 0 %
Eosinophils Absolute: 0 10*3/uL (ref 0.0–0.7)
Eosinophils Relative: 0 %
HCT: 41.7 % (ref 36.0–46.0)
Hemoglobin: 13.8 g/dL (ref 12.0–15.0)
Lymphocytes Relative: 8 %
Lymphs Abs: 0.9 10*3/uL (ref 0.7–4.0)
MCH: 28.9 pg (ref 26.0–34.0)
MCHC: 33.1 g/dL (ref 30.0–36.0)
MCV: 87.2 fL (ref 78.0–100.0)
Monocytes Absolute: 0.5 10*3/uL (ref 0.1–1.0)
Monocytes Relative: 5 %
Neutro Abs: 9.3 10*3/uL — ABNORMAL HIGH (ref 1.7–7.7)
Neutrophils Relative %: 87 %
Platelets: 287 10*3/uL (ref 150–400)
RBC: 4.78 MIL/uL (ref 3.87–5.11)
RDW: 15.2 % (ref 11.5–15.5)
WBC: 10.7 10*3/uL — ABNORMAL HIGH (ref 4.0–10.5)

## 2016-07-20 LAB — I-STAT CG4 LACTIC ACID, ED: Lactic Acid, Venous: 2.8 mmol/L (ref 0.5–1.9)

## 2016-07-20 LAB — TROPONIN I: Troponin I: <0.03 ng/mL (ref <0.03)

## 2016-07-20 LAB — BRAIN NATRIURETIC PEPTIDE: B Natriuretic Peptide: 18.7 pg/mL (ref 0.0–100.0)

## 2016-07-20 MED ORDER — ALBUTEROL SULFATE HFA 108 (90 BASE) MCG/ACT IN AERS
INHALATION_SPRAY | RESPIRATORY_TRACT | Status: AC
  Filled 2016-07-20: qty 6.7

## 2016-07-20 MED ORDER — ALBUTEROL (5 MG/ML) CONTINUOUS INHALATION SOLN
10.0000 mg/h | INHALATION_SOLUTION | RESPIRATORY_TRACT | Status: DC
  Administered 2016-07-20: 10 mg/h via RESPIRATORY_TRACT
  Filled 2016-07-20: qty 20

## 2016-07-20 MED ORDER — ALBUTEROL SULFATE (2.5 MG/3ML) 0.083% IN NEBU
2.5000 mg | INHALATION_SOLUTION | Freq: Once | RESPIRATORY_TRACT | Status: AC
  Administered 2016-07-20: 2.5 mg via RESPIRATORY_TRACT

## 2016-07-20 MED ORDER — ALBUTEROL SULFATE (2.5 MG/3ML) 0.083% IN NEBU
INHALATION_SOLUTION | RESPIRATORY_TRACT | Status: AC
Start: 2016-07-20 — End: 2016-07-20
  Administered 2016-07-20: 2.5 mg via RESPIRATORY_TRACT
  Filled 2016-07-20: qty 3

## 2016-07-20 MED ORDER — IPRATROPIUM-ALBUTEROL 0.5-2.5 (3) MG/3ML IN SOLN
RESPIRATORY_TRACT | Status: AC
  Administered 2016-07-20: 3 mL via RESPIRATORY_TRACT
  Filled 2016-07-20: qty 3

## 2016-07-20 MED ORDER — LEVOFLOXACIN IN D5W 750 MG/150ML IV SOLN
750.0000 mg | Freq: Once | INTRAVENOUS | Status: AC
  Administered 2016-07-20: 750 mg via INTRAVENOUS
  Filled 2016-07-20: qty 150

## 2016-07-20 MED ORDER — PREDNISONE 50 MG PO TABS
60.0000 mg | ORAL_TABLET | Freq: Once | ORAL | Status: AC
  Administered 2016-07-20: 60 mg via ORAL
  Filled 2016-07-20: qty 1

## 2016-07-20 MED ORDER — SODIUM CHLORIDE 0.9 % IV BOLUS (SEPSIS)
500.0000 mL | Freq: Once | INTRAVENOUS | Status: AC
  Administered 2016-07-20: 500 mL via INTRAVENOUS

## 2016-07-20 MED ORDER — IPRATROPIUM-ALBUTEROL 0.5-2.5 (3) MG/3ML IN SOLN
3.0000 mL | Freq: Once | RESPIRATORY_TRACT | Status: AC
  Administered 2016-07-20: 3 mL via RESPIRATORY_TRACT

## 2016-07-20 NOTE — ED Triage Notes (Signed)
Cough and fever x 4 days

## 2016-07-20 NOTE — ED Notes (Signed)
Patient transported to X-ray 

## 2016-07-20 NOTE — Progress Notes (Signed)
Accepted to med-surg bed as inpatient from [facility] (ED physician, Dr. Lita Mains) for several days flu-like illness with dyspnea at rest and increased work of breathing. CXR clear. Treated with nebs, Levaquin, and prednisone. Flu PCR pending.
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
## 2016-07-21 ENCOUNTER — Encounter (HOSPITAL_COMMUNITY): Payer: Self-pay

## 2016-07-21 DIAGNOSIS — E876 Hypokalemia: Secondary | ICD-10-CM

## 2016-07-21 DIAGNOSIS — R69 Illness, unspecified: Secondary | ICD-10-CM | POA: Diagnosis not present

## 2016-07-21 DIAGNOSIS — J9601 Acute respiratory failure with hypoxia: Secondary | ICD-10-CM | POA: Diagnosis not present

## 2016-07-21 DIAGNOSIS — J209 Acute bronchitis, unspecified: Principal | ICD-10-CM

## 2016-07-21 DIAGNOSIS — J9801 Acute bronchospasm: Secondary | ICD-10-CM | POA: Diagnosis present

## 2016-07-21 DIAGNOSIS — R6889 Other general symptoms and signs: Secondary | ICD-10-CM

## 2016-07-21 LAB — INFLUENZA PANEL BY PCR (TYPE A & B)
Influenza A By PCR: NEGATIVE
Influenza B By PCR: NEGATIVE

## 2016-07-21 LAB — LACTIC ACID, PLASMA: Lactic Acid, Venous: 1.6 mmol/L (ref 0.5–1.9)

## 2016-07-21 LAB — GLUCOSE, CAPILLARY
Glucose-Capillary: 117 mg/dL — ABNORMAL HIGH (ref 65–99)
Glucose-Capillary: 126 mg/dL — ABNORMAL HIGH (ref 65–99)
Glucose-Capillary: 185 mg/dL — ABNORMAL HIGH (ref 65–99)

## 2016-07-21 MED ORDER — METOPROLOL SUCCINATE ER 100 MG PO TB24
100.0000 mg | ORAL_TABLET | Freq: Two times a day (BID) | ORAL | Status: DC
  Administered 2016-07-21 (×2): 100 mg via ORAL
  Filled 2016-07-21 (×2): qty 1

## 2016-07-21 MED ORDER — AZITHROMYCIN 500 MG PO TABS
250.0000 mg | ORAL_TABLET | Freq: Every day | ORAL | Status: DC
Start: 2016-07-22 — End: 2016-07-21

## 2016-07-21 MED ORDER — AZITHROMYCIN 500 MG PO TABS: 500.0000 mg | ORAL_TABLET | Freq: Every day | ORAL | Status: DC

## 2016-07-21 MED ORDER — PREDNISONE 20 MG PO TABS
40.0000 mg | ORAL_TABLET | Freq: Every day | ORAL | Status: DC
  Administered 2016-07-21: 40 mg via ORAL
  Filled 2016-07-21: qty 2

## 2016-07-21 MED ORDER — METHOCARBAMOL 500 MG PO TABS: 500.0000 mg | ORAL_TABLET | Freq: Three times a day (TID) | ORAL | Status: DC | PRN

## 2016-07-21 MED ORDER — ALBUTEROL SULFATE (2.5 MG/3ML) 0.083% IN NEBU
2.5000 mg | INHALATION_SOLUTION | RESPIRATORY_TRACT | Status: AC
  Administered 2016-07-21 (×2): 2.5 mg via RESPIRATORY_TRACT
  Filled 2016-07-21 (×2): qty 3

## 2016-07-21 MED ORDER — BUPROPION HCL ER (SR) 150 MG PO TB12
150.0000 mg | ORAL_TABLET | Freq: Two times a day (BID) | ORAL | Status: DC
  Administered 2016-07-21: 150 mg via ORAL
  Filled 2016-07-21 (×3): qty 1

## 2016-07-21 MED ORDER — ASPIRIN EC 81 MG PO TBEC
81.0000 mg | DELAYED_RELEASE_TABLET | Freq: Every day | ORAL | Status: DC
  Administered 2016-07-21: 81 mg via ORAL
  Filled 2016-07-21: qty 1

## 2016-07-21 MED ORDER — ENOXAPARIN SODIUM 80 MG/0.8ML ~~LOC~~ SOLN
70.0000 mg | SUBCUTANEOUS | Status: DC
  Administered 2016-07-21: 10:00:00 70 mg via SUBCUTANEOUS
  Filled 2016-07-21: qty 0.8

## 2016-07-21 MED ORDER — AZITHROMYCIN 250 MG PO TABS: ORAL_TABLET | ORAL | 0 refills | Status: AC

## 2016-07-21 MED ORDER — ATORVASTATIN CALCIUM 40 MG PO TABS
40.0000 mg | ORAL_TABLET | Freq: Every day | ORAL | Status: DC
  Administered 2016-07-21: 40 mg via ORAL
  Filled 2016-07-21: qty 1

## 2016-07-21 MED ORDER — DILTIAZEM HCL ER COATED BEADS 180 MG PO CP24
180.0000 mg | ORAL_CAPSULE | Freq: Every day | ORAL | Status: DC
  Administered 2016-07-21: 180 mg via ORAL
  Filled 2016-07-21: qty 1

## 2016-07-21 MED ORDER — BENZONATATE 100 MG PO CAPS
200.0000 mg | ORAL_CAPSULE | Freq: Three times a day (TID) | ORAL | Status: DC | PRN
  Administered 2016-07-21: 200 mg via ORAL
  Filled 2016-07-21: qty 2

## 2016-07-21 MED ORDER — BENZONATATE 200 MG PO CAPS: 200.0000 mg | ORAL_CAPSULE | Freq: Three times a day (TID) | ORAL | 0 refills | Status: DC | PRN

## 2016-07-21 MED ORDER — INSULIN ASPART 100 UNIT/ML ~~LOC~~ SOLN
0.0000 [IU] | Freq: Three times a day (TID) | SUBCUTANEOUS | Status: DC
  Administered 2016-07-21: 3 [IU] via SUBCUTANEOUS
  Administered 2016-07-21: 2 [IU] via SUBCUTANEOUS

## 2016-07-21 MED ORDER — PANTOPRAZOLE SODIUM 40 MG PO TBEC
40.0000 mg | DELAYED_RELEASE_TABLET | Freq: Every day | ORAL | Status: DC
  Administered 2016-07-21: 40 mg via ORAL
  Filled 2016-07-21: qty 1

## 2016-07-21 MED ORDER — PREDNISONE 20 MG PO TABS: 40.0000 mg | ORAL_TABLET | Freq: Every day | ORAL | 0 refills | Status: AC

## 2016-07-21 NOTE — Discharge Instructions (Signed)

## 2016-07-21 NOTE — Progress Notes (Signed)
Pt arrived to [facility], alert and oriented x 4, no acute distress noted, VS stable. Admission paged. Pt oriented to room and instructed to use call light for assistance. Admission history obtained from pt.

## 2016-07-21 NOTE — Progress Notes (Signed)
Morning labs including lactic acid not ordered.  MD notified, new order for lactic acid placed.

## 2016-07-21 NOTE — Progress Notes (Signed)
Pt given discharge instructions, prescriptions, and care notes. Pt verbalized understanding AEB no further questions or concerns at this time. IV was discontinued, no redness, pain, or swelling noted at this time. Telemetry discontinued and Centralized Telemetry was notified. Pt left the floor via wheelchair with staff in stable condition. 
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
## 2016-07-21 NOTE — Discharge Summary (Signed)
Physician Discharge Summary  Kathleen Good [id number]:[id number] DOB: October 23, 1960 DOA: 07/20/2016  PCP: Ria Bush, MD  Admit date: 07/20/2016 Discharge date: 07/21/2016  Admitted From: Home Disposition:  Home  Recommendations for Outpatient Follow-up:  1. Follow up with PCP in 1-2 weeks 2. Patient will complete 5 day course of antibiotic on 1//2018. Will be discharged on 5 more days of oral prednisone.  Home Health:None Equipment/Devices:None  Discharge Condition:Stable CODE STATUS:Full code Diet recommendation: Regular    Discharge Diagnoses:  Principal Problem:   Acute bronchitis   Active Problems:   Acute respiratory failure with hypoxia (HCC)   Bronchospasm   Flu-like symptoms   Morbid obesity (HCC)   Hypokalemia  Brief narrative/history of present illness 56 year old morbidly obese female with history of diabetes mellitus and hypertension resented with one week history of subjective fever with wheezing, cough and malaise. No relief with over-the-counter medications. In the ED vitals were stable except for being hypoxic to high 80s on room air. Chest x-ray was unremarkable. Blood work showed mild hypokalemia. Flu PCR was negative. She was given nebs and prednisone with minimal improvement in her wheezing and placed on observation.  Hospital course Acute hypoxic respiratory failure with acute bronchitis Placed on empiric oral prednisone, Z-Pak. Supportive care with antitussives and Tylenol. Elevated left acid on presentation has resolved. Flu PCR negative, chest x-ray negative for infiltrate. Evaluated oxygen saturation and patient maintaining sats on room air at 90-91%. Symptoms improve this morning. I will discharge her on 5 more days of oral prednisone and complete course of Z-Pak. Follow-up with PCP in 1 week.  Hypokalemia Replenish  Remaining medical issues including hypertension and diabetes are stable. Continue home medications.  Disposition: Home  Family  communication: None at bedside   Discharge Instructions   Allergies as of 07/21/2016      Reactions   Penicillins Rash   Has patient had a PCN reaction causing immediate rash, facial/tongue/throat swelling, SOB or lightheadedness with hypotension: Yes Has patient had a PCN reaction causing severe rash involving mucus membranes or skin necrosis: No Has patient had a PCN reaction that required hospitalization No Has patient had a PCN reaction occurring within the last 10 years: No If all of the above answers are "NO", then may proceed with Cephalosporin use.   Codeine Sulfate    REACTION: causes something like heart palpitations   Cymbalta [duloxetine Hcl] Other (See Comments)   pedal edema   Gabapentin Other (See Comments)   Visual hallucinations   Hydrocodone-guaifenesin    REACTION: heart palpitations   Lyrica [pregabalin] Other (See Comments)   oversedation   Metformin And Related Nausea Only   GI upset   Phenergan [promethazine Hcl] Itching      Medication List    STOP taking these medications   Cholecalciferol 1000 units capsule Commonly known as:  CVS VITAMIN D3   ezetimibe 10 MG tablet Commonly known as:  ZETIA   FLUoxetine 20 MG capsule Commonly known as:  PROZAC   FLUoxetine 40 MG capsule Commonly known as:  PROZAC   methocarbamol 500 MG tablet Commonly known as:  ROBAXIN   nitroGLYCERIN 0.4 MG SL tablet Commonly known as:  NITROSTAT   vitamin B-12 1000 MCG tablet Commonly known as:  CYANOCOBALAMIN     TAKE these medications   acetaminophen 500 MG tablet Commonly known as:  TYLENOL Take 1,000 mg by mouth every 6 (six) hours as needed for fever or headache (pain).   aspirin EC 81 MG tablet Take 81  mg by mouth daily with supper.   atorvastatin 40 MG tablet Commonly known as:  LIPITOR Take 1 tablet (40 mg total) by mouth daily. What changed:  when to take this   azithromycin 250 MG tablet Commonly known as:  ZITHROMAX 1 tablet daily for 4  days Start taking on:  07/22/2016   benzonatate 200 MG capsule Commonly known as:  TESSALON Take 1 capsule (200 mg total) by mouth 3 (three) times daily as needed for cough.   Blood Glucose Monitoring Suppl Kit Use to check sugar once daily 2 hours after a meal. Dx: E11.65 **Dispense per insurance guidelines**   buPROPion 150 MG 12 hr tablet Commonly known as:  WELLBUTRIN SR TAKE 1 TABLET (150 MG TOTAL) BY MOUTH 2 (TWO) TIMES DAILY.   clobetasol cream 0.05 % Commonly known as:  TEMOVATE Apply 1 application topically 2 (two) times daily. Apply to [facility] What changed:  when to take this  reasons to take this  additional instructions   cyclobenzaprine 10 MG tablet Commonly known as:  FLEXERIL Take 10 mg by mouth 2 (two) times daily as needed for muscle spasms (back pain).   DELSYM PO Take 10 mLs by mouth every 12 (twelve) hours as needed (cough).   diltiazem 180 MG 24 hr capsule Commonly known as:  CARTIA XT Take 1 capsule (180 mg total) by mouth daily. What changed:  when to take this   glimepiride 2 MG tablet Commonly known as:  AMARYL Take 1 tablet (2 mg total) by mouth daily before breakfast.   glucose blood test strip Commonly known as:  ONE TOUCH ULTRA TEST Use to check sugar once daily 2 hours after 2 meal. Dispense based on insurance preference. Dx:E11.65   hydrochlorothiazide 25 MG tablet Commonly known as:  HYDRODIURIL Take 1 tablet (25 mg total) by mouth daily. What changed:  when to take this  reasons to take this   ibuprofen 200 MG tablet Commonly known as:  ADVIL,MOTRIN Take 400 mg by mouth every 6 (six) hours as needed for headache (pain).   metoprolol succinate 100 MG 24 hr tablet Commonly known as:  TOPROL-XL Take 1 tablet (100 mg total) by mouth 2 (two) times daily.   omeprazole 20 MG capsule Commonly known as:  PRILOSEC Take 1 capsule (20 mg total) by mouth 2 (two) times daily.   ONETOUCH DELICA LANCETS 40N Misc Use to check sugar once  daily 2 hours after a meal. Dispense based on insurance preference. Dx: E11.65   predniSONE 20 MG tablet Commonly known as:  DELTASONE Take 2 tablets (40 mg total) by mouth daily with breakfast. Start taking on:  07/22/2016   propranolol 20 MG tablet Commonly known as:  INDERAL Take 1 tablet (20 mg total) by mouth 3 (three) times daily as needed. What changed:  reasons to take this      Follow-up Information    Ria Bush, MD Follow up in 1 week(s).   Specialty:  Family Medicine Contact information: [place] [address] [phone]          Allergies  Allergen Reactions  . Penicillins Rash    Has patient had a PCN reaction causing immediate rash, facial/tongue/throat swelling, SOB or lightheadedness with hypotension: Yes Has patient had a PCN reaction causing severe rash involving mucus membranes or skin necrosis: No Has patient had a PCN reaction that required hospitalization No Has patient had a PCN reaction occurring within the last 10 years: No If all  of the above answers are "NO", then may proceed with Cephalosporin use.  . Codeine Sulfate     REACTION: causes something like heart palpitations  . Cymbalta [Duloxetine Hcl] Other (See Comments)    pedal edema  . Gabapentin Other (See Comments)    Visual hallucinations  . Hydrocodone-Guaifenesin     REACTION: heart palpitations  . Lyrica [Pregabalin] Other (See Comments)    oversedation  . Metformin And Related Nausea Only    GI upset  . Phenergan [Promethazine Hcl] Itching        Procedures/Studies: Dg Chest 2 View  Result Date: 07/20/2016 CLINICAL DATA:  Cough and congestion for 4 days. Fever. Vomiting. Hypertension. EXAM: CHEST  2 VIEW COMPARISON:  09/09/2014 FINDINGS: Lower cervical plate and screw fixator. Borderline enlargement of the cardiopericardial silhouette, without edema. The lungs appear clear. Mild thoracic spondylosis. IMPRESSION: 1. The lungs appear clear. 2.  Mild enlargement of the cardiopericardial silhouette, without edema. 3. Mild thoracic spondylosis. Electronically Signed   By: Van Clines M.D.   On: 07/20/2016 17:21      Subjective: Still having some cough but overall feels better. Afebrile.  Discharge Exam: Vitals:   07/21/16 0244 07/21/16 0520  BP: (!) 141/81 (!) 156/73  Pulse: 91 84  Resp:  20  Temp:  98.8 F (37.1 C)   Vitals:   07/21/16 0100 07/21/16 0244 07/21/16 0520 07/21/16 1237  BP: (!) 153/83 (!) 141/81 (!) 156/73   Pulse:  91 84   Resp:   20   Temp: 98.7 F (37.1 C)  98.8 F (37.1 C)   TempSrc: Oral     SpO2: 93%  95% 95%  Weight: (!) 138.3 kg (305 lb)     Height: _0  (1.651 m)       General:Morbidly obese female not in distress HEENT: Moist mucosa, supple neck Chest: Few scattered rhonchi, no wheeze or crackles CVS: Normal S1 and S2, no murmurs GI: Soft, nondistended, nontender Musculoskeletal: Warm, no edema     The results of significant diagnostics from this hospitalization (including imaging, microbiology, ancillary and laboratory) are listed below for reference.     Microbiology: No results found for this or any previous visit (from the past 240 hour(s)).   Labs: BNP (last 3 results)  Recent Labs  07/20/16 2130  BNP 63.8   Basic Metabolic Panel:  Recent Labs Lab 07/20/16 2130  NA 136  K 3.4*  CL 101  CO2 21*  GLUCOSE 181*  BUN 8  CREATININE 0.93  CALCIUM 8.7*   Liver Function Tests:  Recent Labs Lab 07/20/16 2130  AST 41  ALT 33  ALKPHOS 88  BILITOT 0.5  PROT 8.6*  ALBUMIN 4.2   No results for input(s): LIPASE, AMYLASE in the last 168 hours. No results for input(s): AMMONIA in the last 168 hours. CBC:  Recent Labs Lab 07/20/16 2130  WBC 10.7*  NEUTROABS 9.3*  HGB 13.8  HCT 41.7  MCV 87.2  PLT 287   Cardiac Enzymes:  Recent Labs Lab 07/20/16 2130  TROPONINI <0.03   BNP: Invalid input(s): POCBNP CBG:  Recent Labs Lab 07/21/16 0832  07/21/16 1226  GLUCAP 117* 126*   D-Dimer No results for input(s): DDIMER in the last 72 hours. Hgb A1c No results for input(s): HGBA1C in the last 72 hours. Lipid Profile No results for input(s): CHOL, HDL, LDLCALC, TRIG, CHOLHDL, LDLDIRECT in the last 72 hours. Thyroid function studies No results for input(s): TSH, T4TOTAL, T3FREE, THYROIDAB in the last  72 hours.  Invalid input(s): FREET3 Anemia work up No results for input(s): VITAMINB12, FOLATE, FERRITIN, TIBC, IRON, RETICCTPCT in the last 72 hours. Urinalysis    Component Value Date/Time   BILIRUBINUR negative 06/27/2012 0934   PROTEINUR 1+ 06/27/2012 0934   UROBILINOGEN negative 06/27/2012 0934   NITRITE negative 06/27/2012 0934   LEUKOCYTESUR Trace 06/27/2012 0934   Sepsis Labs Invalid input(s): PROCALCITONIN,  WBC,  LACTICIDVEN Microbiology No results found for this or any previous visit (from the past 240 hour(s)).   Time coordinating discharge: <30 minutes  SIGNED:   Louellen Molder, MD  [facility] 07/21/2016, 1:07 PM Pager   If 7PM-7AM, please contact night-coverage [email or web address] Password [email or web address]

## 2016-07-23 ENCOUNTER — Encounter: Payer: Self-pay | Admitting: Family Medicine

## 2016-07-24 ENCOUNTER — Encounter: Payer: Self-pay | Admitting: Family Medicine

## 2016-07-24 ENCOUNTER — Ambulatory Visit (INDEPENDENT_AMBULATORY_CARE_PROVIDER_SITE_OTHER): Payer: 59 | Admitting: Family Medicine

## 2016-07-24 VITALS — BP 114/72 | HR 64 | Temp 98.2°F | Wt 297.8 lb

## 2016-07-24 DIAGNOSIS — E1165 Type 2 diabetes mellitus with hyperglycemia: Secondary | ICD-10-CM

## 2016-07-24 DIAGNOSIS — Z87891 Personal history of nicotine dependence: Secondary | ICD-10-CM | POA: Diagnosis not present

## 2016-07-24 DIAGNOSIS — J209 Acute bronchitis, unspecified: Secondary | ICD-10-CM

## 2016-07-24 DIAGNOSIS — IMO0001 Reserved for inherently not codable concepts without codable children: Secondary | ICD-10-CM

## 2016-07-24 MED ORDER — SAXAGLIPTIN HCL 2.5 MG PO TABS: 2.5000 mg | ORAL_TABLET | Freq: Every day | ORAL | 11 refills | Status: DC

## 2016-07-24 MED ORDER — GLIMEPIRIDE 4 MG PO TABS: 4.0000 mg | ORAL_TABLET | Freq: Every day | ORAL | 11 refills | Status: DC

## 2016-07-24 MED ORDER — ALBUTEROL SULFATE (2.5 MG/3ML) 0.083% IN NEBU: 2.5000 mg | INHALATION_SOLUTION | Freq: Four times a day (QID) | RESPIRATORY_TRACT | 1 refills | Status: DC | PRN

## 2016-07-24 NOTE — Progress Notes (Signed)
Pre visit review using our clinic review tool, if applicable. No additional management support is needed unless otherwise documented below in the visit note. 

## 2016-07-24 NOTE — Progress Notes (Addendum)
BP 114/72   Pulse 64   Temp 98.2 F (36.8 C) (Oral)   Wt 297 lb 12 oz (135.1 kg)   LMP  (LMP Unknown) Comment: one in past 2 years  SpO2 97%   BMI 49.55 kg/m    CC: hosp f/u visit Subjective:    Patient ID: Kathleen Good, female    DOB: 1961-06-08, 56 y.o.   MRN: [id number]  HPI: Kathleen Good is a 56 y.o. female presenting on 07/24/2016 for Follow-up (hospital)   Recent hospitalization for acute bronchitis with hypoxia, treated with zpack, prednisone and tessalon perls for cough. Flu PCR was negative as was CXR. She was not discharged with any albuterol inhaler. Blood cultures - NG x2  Asking about home nebulizer treatment. She has nebulizer machine at home.   Ex smoker - quit 2014. Not around smokers.  No known asthma or COPD.   Noticing uncontrolled sugars recently, latest A1c 9.9%. Compliant with glimepiride 39m with breakfast. Has started to implement healthy diet changes, avoiding carbs, stopped sodas and drinking more water.   Admit date: 07/20/2016 [facility]  Admitted From: Home Disposition:  Home  Recommendations for Outpatient Follow-up:  1. Follow up with PCP in 1-2 weeks 2. Patient will complete 5 day course of antibiotic on 1//2018. Will be discharged on 5 more days of oral prednisone.  Home Health:None Equipment/Devices:None  Discharge Condition:Stable CODE STATUS:Full code Diet recommendation: Regular  Discharge Diagnoses:  Principal Problem:   Acute bronchitis  Active Problems:   Acute respiratory failure with hypoxia (HCC)   Bronchospasm   Flu-like symptoms   Morbid obesity (HCC)   Hypokalemia  Relevant past medical, surgical, family and social history reviewed and updated as indicated. Interim medical history since our last visit reviewed. Allergies and medications reviewed and updated. Current Outpatient Prescriptions on File Prior to Visit  Medication Sig  . acetaminophen  (TYLENOL) 500 MG tablet Take 1,000 mg by mouth every 6 (six) hours as needed for fever or headache (pain).  .Marland Kitchenaspirin EC 81 MG tablet Take 81 mg by mouth daily with supper.  .Marland Kitchenatorvastatin (LIPITOR) 40 MG tablet Take 1 tablet (40 mg total) by mouth daily. (Patient taking differently: Take 40 mg by mouth daily with supper. )  . azithromycin (ZITHROMAX) 250 MG tablet 1 tablet daily for 4 days  . benzonatate (TESSALON) 200 MG capsule Take 1 capsule (200 mg total) by mouth 3 (three) times daily as needed for cough.  . Blood Glucose Monitoring Suppl KIT Use to check sugar once daily 2 hours after a meal. Dx: E11.65 **Dispense per insurance guidelines**  . buPROPion (WELLBUTRIN SR) 150 MG 12 hr tablet TAKE 1 TABLET (150 MG TOTAL) BY MOUTH 2 (TWO) TIMES DAILY.  . clobetasol cream (TEMOVATE) 04.94% Apply 1 application topically 2 (two) times daily. Apply to AA (Patient taking differently: Apply 1 application topically 2 (two) times daily as needed (skin irritation). )  . cyclobenzaprine (FLEXERIL) 10 MG tablet Take 10 mg by mouth 2 (two) times daily as needed for muscle spasms (back pain).   . Dextromethorphan Polistirex (DELSYM PO) Take 10 mLs by mouth every 12 (twelve) hours as needed (cough).  .Marland Kitchendiltiazem (CARTIA XT) 180 MG 24 hr capsule Take 1 capsule (180 mg total) by mouth daily. (Patient taking differently: Take 180 mg by mouth daily with supper. )  . glucose blood (ONE TOUCH ULTRA TEST) test strip Use to check sugar once  daily 2 hours after 2 meal. Dispense based on insurance preference. Dx:E11.65  . hydrochlorothiazide (HYDRODIURIL) 25 MG tablet Take 1 tablet (25 mg total) by mouth daily. (Patient taking differently: Take 25 mg by mouth daily as needed (swelling). )  . ibuprofen (ADVIL,MOTRIN) 200 MG tablet Take 400 mg by mouth every 6 (six) hours as needed for headache (pain).  . metoprolol succinate (TOPROL-XL) 100 MG 24 hr tablet Take 1 tablet (100 mg total) by mouth 2 (two) times daily.  Marland Kitchen  omeprazole (PRILOSEC) 20 MG capsule Take 1 capsule (20 mg total) by mouth 2 (two) times daily.  Glory Rosebush DELICA LANCETS 91Y MISC Use to check sugar once daily 2 hours after a meal. Dispense based on insurance preference. Dx: E11.65  . predniSONE (DELTASONE) 20 MG tablet Take 2 tablets (40 mg total) by mouth daily with breakfast.  . propranolol (INDERAL) 20 MG tablet Take 1 tablet (20 mg total) by mouth 3 (three) times daily as needed. (Patient taking differently: Take 20 mg by mouth 3 (three) times daily as needed (heart palpitations). )  . [DISCONTINUED] metFORMIN (GLUCOPHAGE) 500 MG tablet Take 1 tablet (500 mg total) by mouth 2 (two) times daily with a meal.   No current facility-administered medications on file prior to visit.     Review of Systems Per HPI unless specifically indicated in ROS section     Objective:    BP 114/72   Pulse 64   Temp 98.2 F (36.8 C) (Oral)   Wt 297 lb 12 oz (135.1 kg)   LMP  (LMP Unknown) Comment: one in past 2 years  SpO2 97%   BMI 49.55 kg/m   Wt Readings from Last 3 Encounters:  07/24/16 297 lb 12 oz (135.1 kg)  07/21/16 (!) 305 lb (138.3 kg)  11/24/15 (!) 306 lb (138.8 kg)    Physical Exam  Constitutional: She appears well-developed and well-nourished. No distress.  Cardiovascular: Normal rate, regular rhythm, normal heart sounds and intact distal pulses.   No murmur heard. Pulmonary/Chest: Effort normal. No respiratory distress. She has decreased breath sounds. She has wheezes (mild). She has rhonchi (coarse). She has no rales.  Musculoskeletal: She exhibits no edema.  Nursing note and vitals reviewed.  Results for orders placed or performed during the hospital encounter of 07/20/16  Culture, blood (Routine X 2) w Reflex to ID Panel  Result Value Ref Range   Specimen Description BLOOD LEFT ARM    Special Requests BOTTLES DRAWN AEROBIC AND ANAEROBIC 10CC EACH    Culture      NO GROWTH 2 DAYS Performed at [facility] [address] [place], [place] [address]    Report Status PENDING   Culture, blood (Routine X 2) w Reflex to ID Panel  Result Value Ref Range   Specimen Description BLOOD LEFT HAND    Special Requests BOTTLES DRAWN AEROBIC AND ANAEROBIC 5CC EACH    Culture      NO GROWTH 2 DAYS Performed at [facility] [address] [place], [place] [address]    Report Status PENDING   CBC with Differential/Platelet  Result Value Ref Range   WBC 10.7 (H) 4.0 - 10.5 K/uL   RBC 4.78 3.87 - 5.11 MIL/uL   Hemoglobin 13.8 12.0 - 15.0 g/dL   HCT 41.7 36.0 - 46.0 %   MCV 87.2 78.0 - 100.0 fL   MCH 28.9 26.0 - 34.0 pg   MCHC 33.1 30.0 - 36.0 g/dL  RDW 15.2 11.5 - 15.5 %   Platelets 287 150 - 400 K/uL   Neutrophils Relative % 87 %   Neutro Abs 9.3 (H) 1.7 - 7.7 K/uL   Lymphocytes Relative 8 %   Lymphs Abs 0.9 0.7 - 4.0 K/uL   Monocytes Relative 5 %   Monocytes Absolute 0.5 0.1 - 1.0 K/uL   Eosinophils Relative 0 %   Eosinophils Absolute 0.0 0.0 - 0.7 K/uL   Basophils Relative 0 %   Basophils Absolute 0.0 0.0 - 0.1 K/uL  Comprehensive metabolic panel  Result Value Ref Range   Sodium 136 135 - 145 mmol/L   Potassium 3.4 (L) 3.5 - 5.1 mmol/L   Chloride 101 101 - 111 mmol/L   CO2 21 (L) 22 - 32 mmol/L   Glucose, Bld 181 (H) 65 - 99 mg/dL   BUN 8 6 - 20 mg/dL   Creatinine, Ser 0.93 0.44 - 1.00 mg/dL   Calcium 8.7 (L) 8.9 - 10.3 mg/dL   Total Protein 8.6 (H) 6.5 - 8.1 g/dL   Albumin 4.2 3.5 - 5.0 g/dL   AST 41 15 - 41 U/L   ALT 33 14 - 54 U/L   Alkaline Phosphatase 88 38 - 126 U/L   Total Bilirubin 0.5 0.3 - 1.2 mg/dL   GFR calc non Af Amer >60 >60 mL/min   GFR calc Af Amer >60 >60 mL/min   Anion gap 14 5 - 15  Brain natriuretic peptide  Result Value Ref Range   B Natriuretic Peptide 18.7 0.0 - 100.0 pg/mL  Troponin I  Result Value Ref Range   Troponin I <0.03 <0.03 ng/mL  Influenza panel by PCR (type A & B)  Result Value Ref Range   Influenza A By PCR NEGATIVE NEGATIVE   Influenza B  By PCR NEGATIVE NEGATIVE  Lactic acid, plasma  Result Value Ref Range   Lactic Acid, Venous 1.6 0.5 - 1.9 mmol/L  Glucose, capillary  Result Value Ref Range   Glucose-Capillary 117 (H) 65 - 99 mg/dL  Glucose, capillary  Result Value Ref Range   Glucose-Capillary 126 (H) 65 - 99 mg/dL  Glucose, capillary  Result Value Ref Range   Glucose-Capillary 185 (H) 65 - 99 mg/dL  I-Stat CG4 Lactic Acid, ED  Result Value Ref Range   Lactic Acid, Venous 2.80 (HH) 0.5 - 1.9 mmol/L   Comment NOTIFIED PHYSICIAN    Lab Results  Component Value Date   HGBA1C 9.9 (H) 06/22/2016       Assessment & Plan:   Problem List Items Addressed This Visit    Acute bronchitis - Primary    Completing zpack, prednisone after recent hospitalization. Records reviewed.  Will Rx albuterol neb to use PRN. Reviewed anticipated course of resolution. Given severity and associated bronchospasm, rec return for spirometry when feeling better to further eval asthma/COPD.       Diabetes type 2, uncontrolled ([place])    Deteriorated. Increase glimepiride to 13m daily with breakfast, monitor for low sugars. Will Rx onglyza for patient to price out. jCelesta Gentilepreviously became unaffordable. GoodRx card provided to patient.       Relevant Medications   saxagliptin HCl (ONGLYZA) 2.5 MG TABS tablet   glimepiride (AMARYL) 4 MG tablet   Ex-smoker    Remains abstinent          Follow up plan: No Follow-up on file.  JRia Bush MD

## 2016-07-24 NOTE — Assessment & Plan Note (Signed)
Deteriorated. Increase glimepiride to 4mg  daily with breakfast, monitor for low sugars. Will Rx onglyza for patient to price out. Celesta Gentile previously became unaffordable. GoodRx card provided to patient.

## 2016-07-24 NOTE — Assessment & Plan Note (Signed)
Remains abstinent 

## 2016-07-24 NOTE — Assessment & Plan Note (Addendum)
Completing zpack, prednisone after recent hospitalization. Records reviewed.  Will Rx albuterol neb to use PRN. Reviewed anticipated course of resolution. Given severity and associated bronchospasm, rec return for spirometry when feeling better to further eval asthma/COPD.

## 2016-07-24 NOTE — Telephone Encounter (Signed)
Seen today. 

## 2016-07-24 NOTE — Patient Instructions (Addendum)
Expected slow and steady recovery Albuterol nebulizer sent to pharmacy to use as needed for shortness of breath or wheezing or cough.  Finish prednisone and azithromycin antibiotic.  For diabetes - continue glimepiride 4mg  with breakfast, price out onglyza.  Return for physical and for spirometry when feeling better.

## 2016-07-26 LAB — CULTURE, BLOOD (ROUTINE X 2)
Culture: NO GROWTH
Culture: NO GROWTH

## 2016-08-17 ENCOUNTER — Telehealth: Payer: Self-pay

## 2016-08-17 ENCOUNTER — Encounter: Payer: Self-pay | Admitting: Family Medicine

## 2016-08-17 ENCOUNTER — Ambulatory Visit (INDEPENDENT_AMBULATORY_CARE_PROVIDER_SITE_OTHER): Payer: 59 | Admitting: Family Medicine

## 2016-08-17 VITALS — BP 122/78 | HR 80 | Temp 98.3°F | Ht 65.0 in | Wt 303.2 lb

## 2016-08-17 DIAGNOSIS — Z23 Encounter for immunization: Secondary | ICD-10-CM | POA: Diagnosis not present

## 2016-08-17 DIAGNOSIS — Z1239 Encounter for other screening for malignant neoplasm of breast: Secondary | ICD-10-CM

## 2016-08-17 DIAGNOSIS — I10 Essential (primary) hypertension: Secondary | ICD-10-CM | POA: Diagnosis not present

## 2016-08-17 DIAGNOSIS — R52 Pain, unspecified: Secondary | ICD-10-CM

## 2016-08-17 DIAGNOSIS — Z0000 Encounter for general adult medical examination without abnormal findings: Secondary | ICD-10-CM

## 2016-08-17 DIAGNOSIS — E559 Vitamin D deficiency, unspecified: Secondary | ICD-10-CM

## 2016-08-17 DIAGNOSIS — Z1231 Encounter for screening mammogram for malignant neoplasm of breast: Secondary | ICD-10-CM

## 2016-08-17 DIAGNOSIS — E1165 Type 2 diabetes mellitus with hyperglycemia: Secondary | ICD-10-CM

## 2016-08-17 DIAGNOSIS — G4733 Obstructive sleep apnea (adult) (pediatric): Secondary | ICD-10-CM

## 2016-08-17 DIAGNOSIS — E784 Other hyperlipidemia: Secondary | ICD-10-CM

## 2016-08-17 DIAGNOSIS — E7849 Other hyperlipidemia: Secondary | ICD-10-CM

## 2016-08-17 DIAGNOSIS — E538 Deficiency of other specified B group vitamins: Secondary | ICD-10-CM

## 2016-08-17 DIAGNOSIS — Z6841 Body Mass Index (BMI) 40.0 and over, adult: Secondary | ICD-10-CM

## 2016-08-17 DIAGNOSIS — H02823 Cysts of right eye, unspecified eyelid: Secondary | ICD-10-CM | POA: Diagnosis not present

## 2016-08-17 DIAGNOSIS — IMO0001 Reserved for inherently not codable concepts without codable children: Secondary | ICD-10-CM

## 2016-08-17 DIAGNOSIS — R21 Rash and other nonspecific skin eruption: Secondary | ICD-10-CM

## 2016-08-17 MED ORDER — CYANOCOBALAMIN 1000 MCG/ML IJ SOLN
1000.0000 ug | Freq: Once | INTRAMUSCULAR | Status: AC
  Administered 2016-08-17: 1000 ug via INTRAMUSCULAR

## 2016-08-17 MED ORDER — ATORVASTATIN CALCIUM 40 MG PO TABS: 40.0000 mg | ORAL_TABLET | Freq: Every day | ORAL | 3 refills | Status: DC

## 2016-08-17 MED ORDER — ATORVASTATIN CALCIUM 80 MG PO TABS: 80.0000 mg | ORAL_TABLET | Freq: Every day | ORAL | 3 refills | Status: DC

## 2016-08-17 MED ORDER — CLOBETASOL PROPIONATE 0.05 % EX CREA: 1.0000 "application " | TOPICAL_CREAM | Freq: Two times a day (BID) | CUTANEOUS | 1 refills | Status: DC | PRN

## 2016-08-17 MED ORDER — ONETOUCH DELICA LANCETS 33G MISC: 3 refills | Status: DC

## 2016-08-17 MED ORDER — GLUCOSE BLOOD VI STRP: ORAL_STRIP | 3 refills | Status: DC

## 2016-08-17 NOTE — Assessment & Plan Note (Signed)
rec restart vit D 1000 IU daily.

## 2016-08-17 NOTE — Assessment & Plan Note (Signed)
Discussed healthy diet and lifestyle changes to affect sustainable weight loss. Activity limited by back pain.

## 2016-08-17 NOTE — Assessment & Plan Note (Signed)
Chronic, stable. Continue current regimen. 

## 2016-08-17 NOTE — Assessment & Plan Note (Signed)
Rash under L breast more consistent with psoriatic rash than intertrigo/candida.

## 2016-08-17 NOTE — Assessment & Plan Note (Addendum)
Chronic, on lipitor 40mg  daily. Increase to 80mg  daily.

## 2016-08-17 NOTE — Assessment & Plan Note (Signed)
Continue onglyza, amaryl. Pt endorses better readings with addition of onglyza. RTC 3 mo DM f/u visit.

## 2016-08-17 NOTE — Assessment & Plan Note (Signed)
Preventative protocols reviewed and updated unless pt declined. Discussed healthy diet and lifestyle.  

## 2016-08-17 NOTE — Assessment & Plan Note (Signed)
Discussing spinal cord stimulation

## 2016-08-17 NOTE — Addendum Note (Signed)
Addended by: Royann Shivers A on: 08/17/2016 12:36 PM   Modules accepted: Orders

## 2016-08-17 NOTE — Progress Notes (Signed)
BP 122/78   Pulse 80   Temp 98.3 F (36.8 C) (Oral)   Ht 5' 5"  (1.651 m)   Wt (!) 303 lb 4 oz (137.6 kg)   LMP  (LMP Unknown) Comment: one in past 2 years  BMI 50.46 kg/m    CC: CPE Subjective:    Patient ID: Kathleen Good, female    DOB: [birth year], 56 y.o.   MRN: [id number]  HPI: Kathleen Good is a 56 y.o. female presenting on 08/17/2016 for Annual Exam   Recent hospitalization for bronchitis with hypoxia. Recovering well from this.   Uncontrolled diabetes - sugars better controlled since starting onglyza. Postprandial 150s. Elevated reading to 500s, since then she cut out sodas.   Chronic lower back pain - pain meds cause trouble functioning. Steroid shots not very effective. Not candidate for surgery. Discussing spinal stimulation unit.  Skin rash under L breast.   Preventative: COLONOSCOPY Date: 12/11/2011 hyperplastic polyp, rpt 5 yrs given fmhx Gatha Mayer) Well woman exam last 08/2014 with normal pap, rpt 3 yrs LMP 04/2014. Getting irregular. Significant hot flashes and night sweats for last 10 yrs.  Last mammogram 04/2012 normal. Due for rpt. Does breast exams at home.  Flu - declines Pneumovax 2015 Tdap - 11/2010 Seat belt use discussed Sunscreen use discussed. No suspicious moles Ex smoker - quit 2014, 15 PY history.  Alcohol - rare   Lives with husband and cats and dogs Occupation: housewife Activity: walk dogs but limited by back pain Diet: good water, some fruits/vegetables  Relevant past medical, surgical, family and social history reviewed and updated as indicated. Interim medical history since our last visit reviewed. Allergies and medications reviewed and updated.  Outpatient Medications Prior to Visit  Medication Sig Dispense Refill  . acetaminophen (TYLENOL) 500 MG tablet Take 1,000 mg by mouth every 6 (six) hours as needed for fever or headache (pain).    Marland Kitchen aspirin EC 81 MG tablet Take 81 mg by mouth daily with supper.    . Blood Glucose  Monitoring Suppl KIT Use to check sugar once daily 2 hours after a meal. Dx: E11.65 **Dispense per insurance guidelines** 1 each 0  . buPROPion (WELLBUTRIN SR) 150 MG 12 hr tablet TAKE 1 TABLET (150 MG TOTAL) BY MOUTH 2 (TWO) TIMES DAILY. 60 tablet 6  . cyclobenzaprine (FLEXERIL) 10 MG tablet Take 10 mg by mouth 2 (two) times daily as needed for muscle spasms (back pain).     Marland Kitchen diltiazem (CARTIA XT) 180 MG 24 hr capsule Take 1 capsule (180 mg total) by mouth daily. (Patient taking differently: Take 180 mg by mouth daily with supper. ) 30 capsule 8  . glimepiride (AMARYL) 4 MG tablet Take 1 tablet (4 mg total) by mouth daily before breakfast. 30 tablet 11  . hydrochlorothiazide (HYDRODIURIL) 25 MG tablet Take 1 tablet (25 mg total) by mouth daily. (Patient taking differently: Take 25 mg by mouth daily as needed (swelling). ) 30 tablet 5  . ibuprofen (ADVIL,MOTRIN) 200 MG tablet Take 400 mg by mouth every 6 (six) hours as needed for headache (pain).    . metoprolol succinate (TOPROL-XL) 100 MG 24 hr tablet Take 1 tablet (100 mg total) by mouth 2 (two) times daily. 60 tablet 3  . omeprazole (PRILOSEC) 20 MG capsule Take 1 capsule (20 mg total) by mouth 2 (two) times daily. 60 capsule 11  . propranolol (INDERAL) 20 MG tablet Take 1 tablet (20 mg total) by mouth 3 (three) times  daily as needed. (Patient taking differently: Take 20 mg by mouth 3 (three) times daily as needed (heart palpitations). ) 90 tablet 3  . saxagliptin HCl (ONGLYZA) 2.5 MG TABS tablet Take 1 tablet (2.5 mg total) by mouth daily. 30 tablet 11  . atorvastatin (LIPITOR) 40 MG tablet Take 1 tablet (40 mg total) by mouth daily. (Patient taking differently: Take 40 mg by mouth daily with supper. ) 30 tablet 11  . clobetasol cream (TEMOVATE) 4.85 % Apply 1 application topically 2 (two) times daily. Apply to AA (Patient taking differently: Apply 1 application topically 2 (two) times daily as needed (skin irritation). ) 30 g 0  . glucose blood  (ONE TOUCH ULTRA TEST) test strip Use to check sugar once daily 2 hours after 2 meal. Dispense based on insurance preference. Dx:E11.65 100 each 3  . ONETOUCH DELICA LANCETS 46E MISC Use to check sugar once daily 2 hours after a meal. Dispense based on insurance preference. Dx: E11.65 100 each 3  . albuterol (PROVENTIL) (2.5 MG/3ML) 0.083% nebulizer solution Take 3 mLs (2.5 mg total) by nebulization every 6 (six) hours as needed for wheezing or shortness of breath. (Patient not taking: Reported on 08/17/2016) 150 mL 1  . benzonatate (TESSALON) 200 MG capsule Take 1 capsule (200 mg total) by mouth 3 (three) times daily as needed for cough. (Patient not taking: Reported on 08/17/2016) 20 capsule 0  . Dextromethorphan Polistirex (DELSYM PO) Take 10 mLs by mouth every 12 (twelve) hours as needed (cough).     No facility-administered medications prior to visit.      Per HPI unless specifically indicated in ROS section below Review of Systems  Constitutional: Negative for activity change, appetite change, chills, fatigue, fever and unexpected weight change.  HENT: Negative for hearing loss.   Eyes: Negative for visual disturbance.  Respiratory: Positive for chest tightness (with exertion). Negative for cough, shortness of breath and wheezing.   Cardiovascular: Positive for leg swelling (worse in summers). Negative for chest pain and palpitations.  Gastrointestinal: Negative for abdominal distention, abdominal pain, blood in stool, constipation, diarrhea, nausea and vomiting.  Genitourinary: Negative for difficulty urinating and hematuria.  Musculoskeletal: Negative for arthralgias, myalgias and neck pain.  Skin: Negative for rash.  Neurological: Positive for headaches. Negative for dizziness, seizures and syncope.  Hematological: Negative for adenopathy. Bruises/bleeds easily.  Psychiatric/Behavioral: Negative for dysphoric mood. The patient is not nervous/anxious.        Objective:    BP 122/78    Pulse 80   Temp 98.3 F (36.8 C) (Oral)   Ht 5' 5"  (1.651 m)   Wt (!) 303 lb 4 oz (137.6 kg)   LMP  (LMP Unknown) Comment: one in past 2 years  BMI 50.46 kg/m   Wt Readings from Last 3 Encounters:  08/17/16 (!) 303 lb 4 oz (137.6 kg)  07/24/16 297 lb 12 oz (135.1 kg)  07/21/16 (!) 305 lb (138.3 kg)    Physical Exam  Constitutional: She is oriented to person, place, and time. She appears well-developed and well-nourished. No distress.  HENT:  Head: Normocephalic and atraumatic.  Right Ear: Hearing, tympanic membrane, external ear and ear canal normal.  Left Ear: Hearing, tympanic membrane, external ear and ear canal normal.  Nose: Nose normal.  Mouth/Throat: Uvula is midline, oropharynx is clear and moist and mucous membranes are normal. No oropharyngeal exudate, posterior oropharyngeal edema or posterior oropharyngeal erythema.  Eyes: Conjunctivae and EOM are normal. Pupils are equal, round, and reactive to  light. No scleral icterus.  Neck: Normal range of motion. Neck supple. No thyromegaly present.  Cardiovascular: Normal rate, regular rhythm, normal heart sounds and intact distal pulses.   No murmur heard. Pulses:      Radial pulses are 2+ on the right side, and 2+ on the left side.  Pulmonary/Chest: Effort normal and breath sounds normal. No respiratory distress. She has no wheezes. She has no rales.  Abdominal: Soft. Bowel sounds are normal. She exhibits no distension and no mass. There is no tenderness. There is no rebound and no guarding.  Musculoskeletal: Normal range of motion. She exhibits no edema.  Lymphadenopathy:    She has no cervical adenopathy.  Neurological: She is alert and oriented to person, place, and time.  CN grossly intact, station and gait intact  Skin: Skin is warm and dry. Rash noted.  Erythematous patchy rash under left breast, scaly patches R lateral chest and L extensor elbow  Psychiatric: She has a normal mood and affect. Her behavior is normal.  Judgment and thought content normal.  Nursing note and vitals reviewed.  Lab Results  Component Value Date   HGBA1C 9.9 (H) 06/22/2016       Assessment & Plan:   Problem List Items Addressed This Visit    Diabetes type 2, uncontrolled ([place])    Continue onglyza, amaryl. Pt endorses better readings with addition of onglyza. RTC 3 mo DM f/u visit.       Relevant Medications   atorvastatin (LIPITOR) 80 MG tablet   Healthcare maintenance - Primary    Preventative protocols reviewed and updated unless pt declined. Discussed healthy diet and lifestyle.       HLD (hyperlipidemia)    Chronic, on lipitor 36m daily. Increase to 863mdaily.      Relevant Medications   atorvastatin (LIPITOR) 80 MG tablet   HYPERTENSION, BENIGN    Chronic, stable. Continue current regimen.       Relevant Medications   atorvastatin (LIPITOR) 80 MG tablet   Morbid obesity with BMI of 50.0-59.9, adult (HCC) (Chronic)    Discussed healthy diet and lifestyle changes to affect sustainable weight loss. Activity limited by back pain.       OSA (obstructive sleep apnea)    CPAP never started - unaffordable.       Pain management    Discussing spinal cord stimulation      SKIN RASH    Rash under L breast more consistent with psoriatic rash than intertrigo/candida.       Vitamin B12 deficiency    b12 shot today. rec start 1000 mcg daily OTC.       Vitamin D deficiency    rec restart vit D 1000 IU daily.       Other Visit Diagnoses    Breast cancer screening       Relevant Orders   MM Digital Screening       Follow up plan: Return in about 3 months (around 11/14/2016) for follow up visit.  JaRia BushMD

## 2016-08-17 NOTE — Patient Instructions (Addendum)
Flu shot today. Vitamin B12 shot today.  Start 1000 units of vit D and 1042mg of vit B12 daily. Colonoscopy will be due this year. You should receive letter this summer.  We will refer you for mammogram at the breast center. Increase lipitor to 832mdaily - new dose at pharmacy. Return as needed or in 3 months for follow up visit.   Health Maintenance, Female Introduction Adopting a healthy lifestyle and getting preventive care can go a long way to promote health and wellness. Talk with your health care provider about what schedule of regular examinations is right for you. This is a good chance for you to check in with your provider about disease prevention and staying healthy. In between checkups, there are plenty of things you can do on your own. Experts have done a lot of research about which lifestyle changes and preventive measures are most likely to keep you healthy. Ask your health care provider for more information. Weight and diet Eat a healthy diet  Be sure to include plenty of vegetables, fruits, low-fat dairy products, and lean protein.  Do not eat a lot of foods high in solid fats, added sugars, or salt.  Get regular exercise. This is one of the most important things you can do for your health.  Most adults should exercise for at least 150 minutes each week. The exercise should increase your heart rate and make you sweat (moderate-intensity exercise).  Most adults should also do strengthening exercises at least twice a week. This is in addition to the moderate-intensity exercise. Maintain a healthy weight  Body mass index (BMI) is a measurement that can be used to identify possible weight problems. It estimates body fat based on height and weight. Your health care provider can help determine your BMI and help you achieve or maintain a healthy weight.  For females 2073ears of age and older:  A BMI below 18.5 is considered underweight.  A BMI of 18.5 to 24.9 is normal.  A  BMI of 25 to 29.9 is considered overweight.  A BMI of 30 and above is considered obese. Watch levels of cholesterol and blood lipids  You should start having your blood tested for lipids and cholesterol at 2014ears of age, then have this test every 5 years.  You may need to have your cholesterol levels checked more often if:  Your lipid or cholesterol levels are high.  You are older than 5079ears of age.  You are at high risk for heart disease. Cancer screening Lung Cancer  Lung cancer screening is recommended for adults [id number] old who are at high risk for lung cancer because of a history of smoking.  A yearly low-dose CT scan of the lungs is recommended for people who:  Currently smoke.  Have quit within the past 15 years.  Have at least a 30-pack-year history of smoking. A pack year is smoking an average of one pack of cigarettes a day for 1 year.  Yearly screening should continue until it has been 15 years since you quit.  Yearly screening should stop if you develop a health problem that would prevent you from having lung cancer treatment. Breast Cancer  Practice breast self-awareness. This means understanding how your breasts normally appear and feel.  It also means doing regular breast self-exams. Let your health care provider know about any changes, no matter how small.  If you are in your 20s or 30s, you should have a clinical breast exam (  CBE) by a health care provider every 1-3 years as part of a regular health exam.  If you are 57 or older, have a CBE every year. Also consider having a breast X-ray (mammogram) every year.  If you have a family history of breast cancer, talk to your health care provider about genetic screening.  If you are at high risk for breast cancer, talk to your health care provider about having an MRI and a mammogram every year.  Breast cancer gene (BRCA) assessment is recommended for women who have family members with BRCA-related  cancers. BRCA-related cancers include:  Breast.  Ovarian.  Tubal.  Peritoneal cancers.  Results of the assessment will determine the need for genetic counseling and BRCA1 and BRCA2 testing. Cervical Cancer  Your health care provider may recommend that you be screened regularly for cancer of the pelvic organs (ovaries, uterus, and vagina). This screening involves a pelvic examination, including checking for microscopic changes to the surface of your cervix (Pap test). You may be encouraged to have this screening done every 3 years, beginning at age 31.  For women ages 83-65, health care providers may recommend pelvic exams and Pap testing every 3 years, or they may recommend the Pap and pelvic exam, combined with testing for human papilloma virus (HPV), every 5 years. Some types of HPV increase your risk of cervical cancer. Testing for HPV may also be done on women of any age with unclear Pap test results.  Other health care providers may not recommend any screening for nonpregnant women who are considered low risk for pelvic cancer and who do not have symptoms. Ask your health care provider if a screening pelvic exam is right for you.  If you have had past treatment for cervical cancer or a condition that could lead to cancer, you need Pap tests and screening for cancer for at least 20 years after your treatment. If Pap tests have been discontinued, your risk factors (such as having a new sexual partner) need to be reassessed to determine if screening should resume. Some women have medical problems that increase the chance of getting cervical cancer. In these cases, your health care provider may recommend more frequent screening and Pap tests. Colorectal Cancer  This type of cancer can be detected and often prevented.  Routine colorectal cancer screening usually begins at 54 years of age and continues through 54 years of age.  Your health care provider may recommend screening at an earlier  age if you have risk factors for colon cancer.  Your health care provider may also recommend using home test kits to check for hidden blood in the stool.  A small camera at the end of a tube can be used to examine your colon directly (sigmoidoscopy or colonoscopy). This is done to check for the earliest forms of colorectal cancer.  Routine screening usually begins at age 45.  Direct examination of the colon should be repeated every 5-10 years through 54 years of age. However, you may need to be screened more often if early forms of precancerous polyps or small growths are found. Skin Cancer  Check your skin from head to toe regularly.  Tell your health care provider about any new moles or changes in moles, especially if there is a change in a mole's shape or color.  Also tell your health care provider if you have a mole that is larger than the size of a pencil eraser.  Always use sunscreen. Apply sunscreen liberally and repeatedly  throughout the day.  Protect yourself by wearing long sleeves, pants, a wide-brimmed hat, and sunglasses whenever you are outside. Heart disease, diabetes, and high blood pressure  High blood pressure causes heart disease and increases the risk of stroke. High blood pressure is more likely to develop in:  People who have blood pressure in the high end of the normal range (130-139/85-89 mm Hg).  People who are overweight or obese.  People who are African American.  If you are 19-38 years of age, have your blood pressure checked every 3-5 years. If you are 24 years of age or older, have your blood pressure checked every year. You should have your blood pressure measured twice-once when you are at a hospital or clinic, and once when you are not at a hospital or clinic. Record the average of the two measurements. To check your blood pressure when you are not at a hospital or clinic, you can use:  An automated blood pressure machine at a pharmacy.  A home blood  pressure monitor.  If you are between 19 years and 30 years old, ask your health care provider if you should take aspirin to prevent strokes.  Have regular diabetes screenings. This involves taking a blood sample to check your fasting blood sugar level.  If you are at a normal weight and have a low risk for diabetes, have this test once every three years after 54 years of age.  If you are overweight and have a high risk for diabetes, consider being tested at a younger age or more often. Preventing infection Hepatitis B  If you have a higher risk for hepatitis B, you should be screened for this virus. You are considered at high risk for hepatitis B if:  You were born in a country where hepatitis B is common. Ask your health care provider which countries are considered high risk.  Your parents were born in a high-risk country, and you have not been immunized against hepatitis B (hepatitis B vaccine).  You have HIV or AIDS.  You use needles to inject street drugs.  You live with someone who has hepatitis B.  You have had sex with someone who has hepatitis B.  You get hemodialysis treatment.  You take certain medicines for conditions, including cancer, organ transplantation, and autoimmune conditions. Hepatitis C  Blood testing is recommended for:  Everyone born from [birth year] through 1963.  Anyone with known risk factors for hepatitis C. Sexually transmitted infections (STIs)  You should be screened for sexually transmitted infections (STIs) including gonorrhea and chlamydia if:  You are sexually active and are younger than 54 years of age.  You are older than 54 years of age and your health care provider tells you that you are at risk for this type of infection.  Your sexual activity has changed since you were last screened and you are at an increased risk for chlamydia or gonorrhea. Ask your health care provider if you are at risk.  If you do not have HIV, but are at risk, it  may be recommended that you take a prescription medicine daily to prevent HIV infection. This is called pre-exposure prophylaxis (PrEP). You are considered at risk if:  You are sexually active and do not regularly use condoms or know the HIV status of your partner(s).  You take drugs by injection.  You are sexually active with a partner who has HIV. Talk with your health care provider about whether you are at high risk of  being infected with HIV. If you choose to begin PrEP, you should first be tested for HIV. You should then be tested every 3 months for as long as you are taking PrEP. Pregnancy  If you are premenopausal and you may become pregnant, ask your health care provider about preconception counseling.  If you may become pregnant, take 400 to 800 micrograms (mcg) of folic acid every day.  If you want to prevent pregnancy, talk to your health care provider about birth control (contraception). Osteoporosis and menopause  Osteoporosis is a disease in which the bones lose minerals and strength with aging. This can result in serious bone fractures. Your risk for osteoporosis can be identified using a bone density scan.  If you are 56 years of age or older, or if you are at risk for osteoporosis and fractures, ask your health care provider if you should be screened.  Ask your health care provider whether you should take a calcium or vitamin D supplement to lower your risk for osteoporosis.  Menopause may have certain physical symptoms and risks.  Hormone replacement therapy may reduce some of these symptoms and risks. Talk to your health care provider about whether hormone replacement therapy is right for you. Follow these instructions at home:  Schedule regular health, dental, and eye exams.  Stay current with your immunizations.  Do not use any tobacco products including cigarettes, chewing tobacco, or electronic cigarettes.  If you are pregnant, do not drink alcohol.  If you  are breastfeeding, limit how much and how often you drink alcohol.  Limit alcohol intake to no more than 1 drink per day for nonpregnant women. One drink equals 12 ounces of beer, 5 ounces of wine, or 1 ounces of hard liquor.  Do not use street drugs.  Do not share needles.  Ask your health care provider for help if you need support or information about quitting drugs.  Tell your health care provider if you often feel depressed.  Tell your health care provider if you have ever been abused or do not feel safe at home. This information is not intended to replace advice given to you by your health care provider. Make sure you discuss any questions you have with your health care provider. Document Released: 12/25/2010 Document Revised: 11/17/2015 Document Reviewed: 03/15/2015  2017 Elsevier

## 2016-08-17 NOTE — Assessment & Plan Note (Addendum)
CPAP never started - unaffordable.

## 2016-08-17 NOTE — Telephone Encounter (Signed)
Kathleen Good from [facility] left v/m they received atorvastatin 40 mg and 80 mg and wanted to verify which pt should be taking. Per 08/17/16 office note atorvastatin was increased to 80 mg. H\T notified and voiced understanding.

## 2016-08-17 NOTE — Progress Notes (Signed)
Pre visit review using our clinic review tool, if applicable. No additional management support is needed unless otherwise documented below in the visit note. 

## 2016-08-17 NOTE — Assessment & Plan Note (Signed)
b12 shot today. rec start 1000 mcg daily OTC.

## 2016-08-21 ENCOUNTER — Other Ambulatory Visit: Payer: Self-pay | Admitting: *Deleted

## 2016-08-21 MED ORDER — ATORVASTATIN CALCIUM 80 MG PO TABS: 80.0000 mg | ORAL_TABLET | Freq: Every day | ORAL | 3 refills | Status: DC

## 2016-09-10 DIAGNOSIS — H02823 Cysts of right eye, unspecified eyelid: Secondary | ICD-10-CM | POA: Diagnosis not present

## 2016-09-12 ENCOUNTER — Other Ambulatory Visit: Payer: Self-pay | Admitting: Family Medicine

## 2016-09-12 ENCOUNTER — Encounter: Payer: Self-pay | Admitting: Family Medicine

## 2016-09-12 DIAGNOSIS — Z1231 Encounter for screening mammogram for malignant neoplasm of breast: Secondary | ICD-10-CM

## 2016-09-13 DIAGNOSIS — G894 Chronic pain syndrome: Secondary | ICD-10-CM | POA: Diagnosis not present

## 2016-09-13 MED ORDER — METOPROLOL SUCCINATE ER 100 MG PO TB24: 100.0000 mg | ORAL_TABLET | Freq: Two times a day (BID) | ORAL | 2 refills | Status: DC

## 2016-09-20 DIAGNOSIS — E119 Type 2 diabetes mellitus without complications: Secondary | ICD-10-CM | POA: Diagnosis not present

## 2016-09-20 LAB — HM DIABETES EYE EXAM

## 2016-09-26 ENCOUNTER — Encounter: Payer: Self-pay | Admitting: *Deleted

## 2016-10-09 ENCOUNTER — Other Ambulatory Visit: Payer: Self-pay | Admitting: Family Medicine

## 2016-10-09 ENCOUNTER — Encounter: Payer: Self-pay | Admitting: Family Medicine

## 2016-10-12 ENCOUNTER — Encounter: Payer: Self-pay | Admitting: Family Medicine

## 2016-10-12 MED ORDER — TRAMADOL HCL 50 MG PO TABS: 25.0000 mg | ORAL_TABLET | Freq: Three times a day (TID) | ORAL | 0 refills | Status: DC | PRN

## 2016-10-12 NOTE — Telephone Encounter (Signed)
plz phone in tramadol. Thanks.

## 2016-10-12 NOTE — Telephone Encounter (Signed)
Rx called in to requested pharmcy

## 2016-10-23 ENCOUNTER — Other Ambulatory Visit: Payer: Self-pay | Admitting: Family Medicine

## 2016-10-24 MED ORDER — OMEPRAZOLE 20 MG PO CPDR
20.0000 mg | DELAYED_RELEASE_CAPSULE | Freq: Two times a day (BID) | ORAL | 11 refills | Status: DC
Start: 2016-10-24 — End: 2017-10-31

## 2016-11-16 DIAGNOSIS — M545 Low back pain: Secondary | ICD-10-CM | POA: Diagnosis not present

## 2016-11-16 DIAGNOSIS — G894 Chronic pain syndrome: Secondary | ICD-10-CM | POA: Diagnosis not present

## 2016-11-16 DIAGNOSIS — M5416 Radiculopathy, lumbar region: Secondary | ICD-10-CM | POA: Diagnosis not present

## 2016-11-23 ENCOUNTER — Other Ambulatory Visit: Payer: 59

## 2016-11-23 ENCOUNTER — Encounter: Payer: Self-pay | Admitting: Family Medicine

## 2016-11-23 MED ORDER — HYDROCHLOROTHIAZIDE 25 MG PO TABS: 25.0000 mg | ORAL_TABLET | Freq: Every day | ORAL | 1 refills | Status: DC | PRN

## 2016-11-23 NOTE — Telephone Encounter (Signed)
Last filled 2016... Please advise

## 2016-11-27 ENCOUNTER — Ambulatory Visit: Payer: 59 | Admitting: Family Medicine

## 2016-11-28 ENCOUNTER — Other Ambulatory Visit: Payer: Self-pay | Admitting: Family Medicine

## 2016-11-28 NOTE — Telephone Encounter (Signed)
Last filled 09/27/2015 with 6 refills... Pt has upcoming appt in Aug--- please advise if okay to refill

## 2016-11-29 ENCOUNTER — Encounter: Payer: Self-pay | Admitting: Family Medicine

## 2016-11-29 MED ORDER — BUPROPION HCL ER (SR) 150 MG PO TB12: ORAL_TABLET | ORAL | 5 refills | Status: DC

## 2016-11-29 NOTE — Telephone Encounter (Signed)
What med?

## 2016-11-30 NOTE — Telephone Encounter (Signed)
It was for the Wellbutrin but has already been filled

## 2016-12-01 DIAGNOSIS — M546 Pain in thoracic spine: Secondary | ICD-10-CM | POA: Diagnosis not present

## 2016-12-25 ENCOUNTER — Encounter: Payer: Self-pay | Admitting: Family Medicine

## 2016-12-25 DIAGNOSIS — M961 Postlaminectomy syndrome, not elsewhere classified: Secondary | ICD-10-CM | POA: Diagnosis not present

## 2016-12-25 DIAGNOSIS — M546 Pain in thoracic spine: Secondary | ICD-10-CM | POA: Diagnosis not present

## 2016-12-25 DIAGNOSIS — M5136 Other intervertebral disc degeneration, lumbar region: Secondary | ICD-10-CM | POA: Diagnosis not present

## 2016-12-31 ENCOUNTER — Ambulatory Visit: Payer: Self-pay | Admitting: Physician Assistant

## 2017-01-01 ENCOUNTER — Ambulatory Visit: Payer: 59 | Admitting: Family Medicine

## 2017-01-04 ENCOUNTER — Ambulatory Visit (INDEPENDENT_AMBULATORY_CARE_PROVIDER_SITE_OTHER): Payer: 59 | Admitting: Family Medicine

## 2017-01-04 ENCOUNTER — Encounter: Payer: Self-pay | Admitting: Family Medicine

## 2017-01-04 VITALS — BP 110/78 | HR 80 | Ht 65.0 in | Wt 301.0 lb

## 2017-01-04 DIAGNOSIS — I10 Essential (primary) hypertension: Secondary | ICD-10-CM | POA: Diagnosis not present

## 2017-01-04 DIAGNOSIS — E784 Other hyperlipidemia: Secondary | ICD-10-CM | POA: Diagnosis not present

## 2017-01-04 DIAGNOSIS — M797 Fibromyalgia: Secondary | ICD-10-CM | POA: Diagnosis not present

## 2017-01-04 DIAGNOSIS — Z6841 Body Mass Index (BMI) 40.0 and over, adult: Secondary | ICD-10-CM | POA: Diagnosis not present

## 2017-01-04 DIAGNOSIS — E1165 Type 2 diabetes mellitus with hyperglycemia: Secondary | ICD-10-CM | POA: Diagnosis not present

## 2017-01-04 DIAGNOSIS — Z01818 Encounter for other preprocedural examination: Secondary | ICD-10-CM | POA: Diagnosis not present

## 2017-01-04 DIAGNOSIS — IMO0001 Reserved for inherently not codable concepts without codable children: Secondary | ICD-10-CM

## 2017-01-04 DIAGNOSIS — E7849 Other hyperlipidemia: Secondary | ICD-10-CM

## 2017-01-04 LAB — BASIC METABOLIC PANEL
BUN: 8 mg/dL (ref 6–23)
CO2: 25 mEq/L (ref 19–32)
Calcium: 8.9 mg/dL (ref 8.4–10.5)
Chloride: 103 mEq/L (ref 96–112)
Creatinine, Ser: 0.93 mg/dL (ref 0.40–1.20)
GFR: 66.77 mL/min (ref >60.00)
Glucose, Bld: 180 mg/dL — ABNORMAL HIGH (ref 70–99)
Potassium: 3.7 mEq/L (ref 3.5–5.1)
Sodium: 138 mEq/L (ref 135–145)

## 2017-01-04 LAB — LDL CHOLESTEROL, DIRECT: Direct LDL: 202 mg/dL

## 2017-01-04 LAB — CBC WITH DIFFERENTIAL/PLATELET
Basophils Absolute: 0.1 10*3/uL (ref 0.0–0.1)
Basophils Relative: 0.8 % (ref 0.0–3.0)
Eosinophils Absolute: 0.3 10*3/uL (ref 0.0–0.7)
Eosinophils Relative: 3.2 % (ref 0.0–5.0)
HCT: 40.2 % (ref 36.0–46.0)
Hemoglobin: 13.5 g/dL (ref 12.0–15.0)
Lymphocytes Relative: 20.1 % (ref 12.0–46.0)
Lymphs Abs: 2 10*3/uL (ref 0.7–4.0)
MCHC: 33.4 g/dL (ref 30.0–36.0)
MCV: 85.9 fl (ref 78.0–100.0)
Monocytes Absolute: 0.8 10*3/uL (ref 0.1–1.0)
Monocytes Relative: 7.8 % (ref 3.0–12.0)
Neutro Abs: 6.9 10*3/uL (ref 1.4–7.7)
Neutrophils Relative %: 68.1 % (ref 43.0–77.0)
Platelets: 308 10*3/uL (ref 150.0–400.0)
RBC: 4.69 Mil/uL (ref 3.87–5.11)
RDW: 15.3 % (ref 11.5–15.5)
WBC: 10.2 10*3/uL (ref 4.0–10.5)

## 2017-01-04 LAB — HEMOGLOBIN A1C: Hgb A1c MFr Bld: 8.3 % — ABNORMAL HIGH (ref 4.6–6.5)

## 2017-01-04 NOTE — Patient Instructions (Addendum)
Update EKG today Labs today.  We will be in touch with Dr Rolena Infante pending results. See you next visit for physical.

## 2017-01-04 NOTE — Assessment & Plan Note (Addendum)
RCRI score = 0, adequately low risk to proceed with upcoming surgical procedure.  See below for glycemic control.  Reviewed recent CXR. EKG today.

## 2017-01-04 NOTE — Progress Notes (Addendum)
BP 110/78   Pulse 80   Ht 5' 5"  (1.651 m)   Wt (!) 301 lb (136.5 kg)   SpO2 98%   BMI 50.09 kg/m    CC: preop eval Subjective:    Patient ID: Kathleen Good, female    DOB: [birth year], 56 y.o.   MRN: [id number]  HPI: Kathleen Good is a 56 y.o. female presenting on 01/04/2017 for Follow-up   Planned upcoming permanent implantation of dorsal column stimulator by Dr Rolena Infante for chronic lower back pain, failed multiple lumbar steroid injections and RF ablations. Neurosurgery did not think there was a surgical solution to her back pain. Planned general anesthesia according to patient.   DM - regularly does check sugars 1-2 x/day, high 180, averaging 140s. Compliant with antihyperglycemic regimen which includes: amaryl 51m with breakfast, onglyza 2.565mdaily. Known metformin intolerance. Denies low sugars or hypoglycemic symptoms. + paresthesias of feet. Last diabetic eye exam 08/2016. Pneumovax: 2015. Prevnar: not due. Lab Results  Component Value Date   HGBA1C 8.3 (H) 01/04/2017   Diabetic Foot Exam - Simple   Simple Foot Form Diabetic Foot exam was performed with the following findings:  Yes 01/04/2017  8:33 AM  Visual Inspection No deformities, no ulcerations, no other skin breakdown bilaterally:  Yes Sensation Testing Intact to touch and monofilament testing bilaterally:  Yes Pulse Check Posterior Tibialis and Dorsalis pulse intact bilaterally:  Yes Comments     She has undergone general anesthesia in the past, latest for elbow surgery (ulnar nerve transposition) 2005. Tolerated well but did have post-op nausea.   Denies recent chest pain, tightness, dyspnea, lightheadedness, dizziness. She has had prior cardiac evaluation including normal catheterization 2000, no ischemia on stress echo 2011, and normal 30 day event monitor except for tachycardia with exercise 2016 ([place]).  Morbid obesity - not active due to back pain - hopeful for increased activity after upcoming  implantation.   Chronic diffuse headache with intermittent migraines (twice a week). She has [facility].   No h/o asthma. Rare alcohol.  Quit smoking 4 yrs ago. 15 PY hx.   Relevant past medical, surgical, family and social history reviewed and updated as indicated. Interim medical history since our last visit reviewed. Allergies and medications reviewed and updated. Outpatient Medications Prior to Visit  Medication Sig Dispense Refill  . acetaminophen (TYLENOL) 500 MG tablet Take 1,000 mg by mouth every 6 (six) hours as needed for fever or headache (pain).    . Marland Kitchenspirin EC 81 MG tablet Take 81 mg by mouth daily with supper.    . Marland Kitchentorvastatin (LIPITOR) 80 MG tablet Take 1 tablet (80 mg total) by mouth daily with supper. 90 tablet 3  . Blood Glucose Monitoring Suppl KIT Use to check sugar once daily 2 hours after a meal. Dx: E11.65 **Dispense per insurance guidelines** 1 each 0  . buPROPion (WELLBUTRIN SR) 150 MG 12 hr tablet TAKE 1 TABLET (150 MG TOTAL) BY MOUTH 2 (TWO) TIMES DAILY. 60 tablet 5  . CARTIA XT 180 MG 24 hr capsule TAKE 1 CAPSULE (180 MG TOTAL) BY MOUTH DAILY. 30 capsule 11  . clobetasol cream (TEMOVATE) 0.9.97 Apply 1 application topically 2 (two) times daily as needed. 45 g 1  . cyclobenzaprine (FLEXERIL) 10 MG tablet Take 10 mg by mouth 2 (two) times daily as needed for muscle spasms (back pain).     . Marland Kitchenlimepiride (AMARYL) 4 MG tablet Take 1 tablet (4 mg total)  by mouth daily before breakfast. 30 tablet 11  . glucose blood (ONE TOUCH ULTRA TEST) test strip Use to check sugar twice daily. Dispense based on insurance preference. Dx:E11.65 180 each 3  . hydrochlorothiazide (HYDRODIURIL) 25 MG tablet Take 1 tablet (25 mg total) by mouth daily as needed (swelling). 30 tablet 1  . ibuprofen (ADVIL,MOTRIN) 200 MG tablet Take 400 mg by mouth every 6 (six) hours as needed for headache (pain).    . metoprolol succinate (TOPROL-XL) 100 MG 24 hr  tablet Take 1 tablet (100 mg total) by mouth 2 (two) times daily. 60 tablet 2  . omeprazole (PRILOSEC) 20 MG capsule Take 1 capsule (20 mg total) by mouth 2 (two) times daily. 60 capsule 11  . ONETOUCH DELICA LANCETS 41S MISC Use to check sugar twice daily as needed. Dispense based on insurance preference. Dx: E11.65 180 each 3  . propranolol (INDERAL) 20 MG tablet Take 1 tablet (20 mg total) by mouth 3 (three) times daily as needed. (Patient taking differently: Take 20 mg by mouth 3 (three) times daily as needed (heart palpitations). ) 90 tablet 3  . saxagliptin HCl (ONGLYZA) 2.5 MG TABS tablet Take 1 tablet (2.5 mg total) by mouth daily. 30 tablet 11  . traMADol (ULTRAM) 50 MG tablet Take 0.5-1 tablets (25-50 mg total) by mouth 3 (three) times daily as needed. 40 tablet 0   No facility-administered medications prior to visit.      Per HPI unless specifically indicated in ROS section below Review of Systems     Objective:    BP 110/78   Pulse 80   Ht 5' 5"  (1.651 m)   Wt (!) 301 lb (136.5 kg)   SpO2 98%   BMI 50.09 kg/m   Wt Readings from Last 3 Encounters:  01/04/17 (!) 301 lb (136.5 kg)  08/17/16 (!) 303 lb 4 oz (137.6 kg)  07/24/16 297 lb 12 oz (135.1 kg)    Physical Exam  Constitutional: She appears well-developed and well-nourished. No distress.  HENT:  Head: Normocephalic and atraumatic.  Right Ear: External ear normal.  Left Ear: External ear normal.  Nose: Nose normal.  Mouth/Throat: Oropharynx is clear and moist. No oropharyngeal exudate.  Eyes: Pupils are equal, round, and reactive to light. Conjunctivae and EOM are normal. No scleral icterus.  Neck: Normal range of motion. Neck supple.  Cardiovascular: Normal rate, regular rhythm, normal heart sounds and intact distal pulses.   No murmur heard. Pulmonary/Chest: Effort normal and breath sounds normal. No respiratory distress. She has no wheezes. She has no rales.  Musculoskeletal: She exhibits no edema.  See HPI  for foot exam if done  Lymphadenopathy:    She has no cervical adenopathy.  Skin: Skin is warm and dry. No rash noted.  Psychiatric: She has a normal mood and affect.  Nursing note and vitals reviewed.  Results for orders placed or performed in visit on 01/04/17  Hemoglobin A1c  Result Value Ref Range   Hgb A1c MFr Bld 8.3 (H) 4.6 - 6.5 %  CBC with Differential/Platelet  Result Value Ref Range   WBC 10.2 4.0 - 10.5 K/uL   RBC 4.69 3.87 - 5.11 Mil/uL   Hemoglobin 13.5 12.0 - 15.0 g/dL   HCT 40.2 36.0 - 46.0 %   MCV 85.9 78.0 - 100.0 fl   MCHC 33.4 30.0 - 36.0 g/dL   RDW 15.3 11.5 - 15.5 %   Platelets 308.0 150.0 - 400.0 K/uL   Neutrophils Relative %  68.1 43.0 - 77.0 %   Lymphocytes Relative 20.1 12.0 - 46.0 %   Monocytes Relative 7.8 3.0 - 12.0 %   Eosinophils Relative 3.2 0.0 - 5.0 %   Basophils Relative 0.8 0.0 - 3.0 %   Neutro Abs 6.9 1.4 - 7.7 K/uL   Lymphs Abs 2.0 0.7 - 4.0 K/uL   Monocytes Absolute 0.8 0.1 - 1.0 K/uL   Eosinophils Absolute 0.3 0.0 - 0.7 K/uL   Basophils Absolute 0.1 0.0 - 0.1 K/uL  Basic metabolic panel  Result Value Ref Range   Sodium 138 135 - 145 mEq/L   Potassium 3.7 3.5 - 5.1 mEq/L   Chloride 103 96 - 112 mEq/L   CO2 25 19 - 32 mEq/L   Glucose, Bld 180 (H) 70 - 99 mg/dL   BUN 8 6 - 23 mg/dL   Creatinine, Ser 0.93 0.40 - 1.20 mg/dL   Calcium 8.9 8.4 - 10.5 mg/dL   GFR 66.77 >60.00 mL/min  LDL Cholesterol, Direct  Result Value Ref Range   Direct LDL 202.0 mg/dL   CHEST  2 VIEW COMPARISON:  09/09/2014 FINDINGS: Lower cervical plate and screw fixator. Borderline enlargement of the cardiopericardial silhouette, without edema. The lungs appear clear. Mild thoracic spondylosis. IMPRESSION: 1. The lungs appear clear. 2. Mild enlargement of the cardiopericardial silhouette, without edema. 3. Mild thoracic spondylosis. Electronically Signed   By: Van Clines M.D.   On: 07/20/2016 17:21  EKG - NSR rate 75, normal axis, intervals,  nonspecific anterior ST flattening with poor R wave progression new from prior EKG 2016, no acute ST/T changes.     Assessment & Plan:   Problem List Items Addressed This Visit    Diabetes type 2, uncontrolled ([place])    Overdue for DM f/u. Discussed goal A1c <8 in setting of upcoming surgical procedure, ideal A1c <6.5%.  Does not tolerate metformin.  Update A1c. If above goal, low threshold to augment antihyperglycemic therapy.       Relevant Orders   Hemoglobin A1c (Completed)   Basic metabolic panel (Completed)   LDL Cholesterol, Direct (Completed)   Fibromyalgia (Chronic)   HLD (hyperlipidemia)    Pt on lipitor 16m daily. Update dLDL.       HYPERTENSION, BENIGN    Chronic, stable continue current regimen.       Morbid obesity with BMI of 50.0-59.9, adult (HCC) (Chronic)    Continue to encourage ongoing weight loss efforts through healthy diet choices. Activity limited by chronic back pain.       Pre-op evaluation - Primary    RCRI score = 0, adequately low risk to proceed with upcoming surgical procedure.  See below for glycemic control.  Reviewed recent CXR. EKG today.       Relevant Orders   EKG 12-Lead (Completed)   CBC with Differential/Platelet (Completed)       ADDENDUM ==> labwork returned - will increase onglyza to 514mdaily, change lipitor to crestor 4082maily. New doses sent to pharmacy.   Follow up plan: Return as needed.  JavRia BushD

## 2017-01-05 ENCOUNTER — Other Ambulatory Visit: Payer: Self-pay | Admitting: Family Medicine

## 2017-01-05 ENCOUNTER — Encounter: Payer: Self-pay | Admitting: Family Medicine

## 2017-01-05 MED ORDER — ROSUVASTATIN CALCIUM 40 MG PO TABS: 40.0000 mg | ORAL_TABLET | Freq: Every day | ORAL | 3 refills | Status: DC

## 2017-01-05 MED ORDER — SAXAGLIPTIN HCL 5 MG PO TABS: 5.0000 mg | ORAL_TABLET | Freq: Every day | ORAL | 3 refills | Status: DC

## 2017-01-05 NOTE — Assessment & Plan Note (Addendum)
Overdue for DM f/u. Discussed goal A1c <8 in setting of upcoming surgical procedure, ideal A1c <6.5%.  Does not tolerate metformin.  Update A1c. If above goal, low threshold to augment antihyperglycemic therapy.

## 2017-01-05 NOTE — Assessment & Plan Note (Signed)
Chronic, stable continue current regimen.  

## 2017-01-05 NOTE — Assessment & Plan Note (Addendum)
Continue to encourage ongoing weight loss efforts through healthy diet choices. Activity limited by chronic back pain.

## 2017-01-05 NOTE — Assessment & Plan Note (Addendum)
Pt on lipitor 80mg  daily. Update dLDL.

## 2017-01-10 ENCOUNTER — Encounter: Payer: Self-pay | Admitting: Internal Medicine

## 2017-01-11 ENCOUNTER — Other Ambulatory Visit: Payer: Self-pay | Admitting: Family Medicine

## 2017-01-18 NOTE — Pre-Procedure Instructions (Signed)
AURELIE DICENZO  01/18/2017      [place], [place] [address] Phone: [phone] Fax: [phone]    Your procedure is scheduled on Thursday August 2.  Report to [facility] Admitting at 8:45 A.M.  Call this number if you have problems the morning of surgery:  [phone]   Remember:  Do not eat food or drink liquids after midnight.  CONTINUE taking all medications as prescribed by your doctor except for the instructions given below.   Take these medicines the morning of surgery with A SIP OF WATER: wellbutrin (bupropion), CARTIA XT, metoprolol (Toprol-XL), omeprazole (prilosec), tramadol (ultram) if needed, propranolol (Inderal) if needed  7 days prior to surgery STOP taking any Aspirin, Aleve, Naproxen, Ibuprofen, Motrin, Advil, Goody's, BC's, all herbal medications, fish oil, and all vitamins  WHAT DO I DO ABOUT MY DIABETES MEDICATION?   Marland Kitchen Do not take oral diabetes medicines (pills) the morning of surgery. DO NOT TAKE Glimepiride (Amaryl) or Onglyza (saxagliptin) the day of surgery     How to Manage Your Diabetes Before and After Surgery  Why is it important to control my blood sugar before and after surgery? . Improving blood sugar levels before and after surgery helps healing and can limit problems. . A way of improving blood sugar control is eating a healthy diet by: o  Eating less sugar and carbohydrates o  Increasing activity/exercise o  Talking with your doctor about reaching your blood sugar goals . High blood sugars (greater than 180 mg/dL) can raise your risk of infections and slow your recovery, so you will need to focus on controlling your diabetes during the weeks before surgery. . Make sure that the doctor who takes care of your diabetes knows about your planned surgery including the date and location.  How do I manage my blood sugar before  surgery? . Check your blood sugar at least 4 times a day, starting 2 days before surgery, to make sure that the level is not too high or low. o Check your blood sugar the morning of your surgery when you wake up and every 2 hours until you get to the Short Stay unit. . If your blood sugar is less than 70 mg/dL, you will need to treat for low blood sugar: o Do not take insulin. o Treat a low blood sugar (less than 70 mg/dL) with  cup of clear juice (cranberry or apple), 4 glucose tablets, OR glucose gel. o Recheck blood sugar in 15 minutes after treatment (to make sure it is greater than 70 mg/dL). If your blood sugar is not greater than 70 mg/dL on recheck, call [phone] for further instructions. . Report your blood sugar to the short stay nurse when you get to Short Stay.  . If you are admitted to the hospital after surgery: o Your blood sugar will be checked by the staff and you will probably be given insulin after surgery (instead of oral diabetes medicines) to make sure you have good blood sugar levels. o The goal for blood sugar control after surgery is 80-180 mg/dL.                   Do not wear jewelry, make-up or nail polish.  Do not wear lotions, powders, or perfumes, or deoderant.  Do not shave 48 hours prior to surgery.    Do not bring valuables to the hospital.  [facility] is not responsible for any belongings or valuables.  Contacts, dentures or bridgework may not be worn into surgery.  Leave your suitcase in the car.  After surgery it may be brought to your room.  For patients admitted to the hospital, discharge time will be determined by your treatment team.  Patients discharged the day of surgery will not be allowed to drive home.    Special instructions:    [facility]- Preparing For Surgery  Before surgery, you can play an important role. Because skin is not sterile, your skin needs to be as free of germs as possible. You can reduce the number of  germs on your skin by washing with CHG (chlorahexidine gluconate) Soap before surgery.  CHG is an antiseptic cleaner which kills germs and bonds with the skin to continue killing germs even after washing.  Please do not use if you have an allergy to CHG or antibacterial soaps. If your skin becomes reddened/irritated stop using the CHG.  Do not shave (including legs and underarms) for at least 48 hours prior to first CHG shower. It is OK to shave your face.  Please follow these instructions carefully.   1. Shower the NIGHT BEFORE SURGERY and the MORNING OF SURGERY with CHG.   2. If you chose to wash your hair, wash your hair first as usual with your normal shampoo.  3. After you shampoo, rinse your hair and body thoroughly to remove the shampoo.  4. Use CHG as you would any other liquid soap. You can apply CHG directly to the skin and wash gently with a scrungie or a clean washcloth.   5. Apply the CHG Soap to your body ONLY FROM THE NECK DOWN.  Do not use on open wounds or open sores. Avoid contact with your eyes, ears, mouth and genitals (private parts). Wash genitals (private parts) with your normal soap.  6. Wash thoroughly, paying special attention to the area where your surgery will be performed.  7. Thoroughly rinse your body with warm water from the neck down.  8. DO NOT shower/wash with your normal soap after using and rinsing off the CHG Soap.  9. Pat yourself dry with a CLEAN TOWEL.   10. Wear CLEAN PAJAMAS   11. Place CLEAN SHEETS on your bed the night of your first shower and DO NOT SLEEP WITH PETS.    Day of Surgery: Do not apply any deodorants/lotions. Please wear clean clothes to the hospital/surgery center.

## 2017-01-21 ENCOUNTER — Encounter (HOSPITAL_COMMUNITY): Payer: Self-pay | Admitting: Urology

## 2017-01-21 ENCOUNTER — Encounter (HOSPITAL_COMMUNITY)
Admission: RE | Admit: 2017-01-21 | Discharge: 2017-01-21 | Disposition: A | Discharge status: Home / Self Care | Payer: 59 | Source: Ambulatory Visit | Attending: Orthopedic Surgery | Admitting: Orthopedic Surgery

## 2017-01-21 DIAGNOSIS — Z01818 Encounter for other preprocedural examination: Secondary | ICD-10-CM | POA: Insufficient documentation

## 2017-01-21 DIAGNOSIS — G8929 Other chronic pain: Secondary | ICD-10-CM

## 2017-01-21 DIAGNOSIS — M549 Dorsalgia, unspecified: Secondary | ICD-10-CM

## 2017-01-21 HISTORY — DX: Other specified postprocedural states: Z98.890

## 2017-01-21 HISTORY — DX: Other specified postprocedural states: R11.2

## 2017-01-21 HISTORY — DX: Palpitations: R00.2

## 2017-01-21 LAB — BASIC METABOLIC PANEL
Anion gap: 9 (ref 5–15)
BUN: 8 mg/dL (ref 6–20)
CO2: 23 mmol/L (ref 22–32)
Calcium: 8.8 mg/dL — ABNORMAL LOW (ref 8.9–10.3)
Chloride: 105 mmol/L (ref 101–111)
Creatinine, Ser: 1.18 mg/dL — ABNORMAL HIGH (ref 0.44–1.00)
GFR calc Af Amer: 59 mL/min — ABNORMAL LOW (ref >60)
GFR calc non Af Amer: 51 mL/min — ABNORMAL LOW (ref >60)
Glucose, Bld: 173 mg/dL — ABNORMAL HIGH (ref 65–99)
Potassium: 4.5 mmol/L (ref 3.5–5.1)
Sodium: 137 mmol/L (ref 135–145)

## 2017-01-21 LAB — CBC
HCT: 42.4 % (ref 36.0–46.0)
Hemoglobin: 13.7 g/dL (ref 12.0–15.0)
MCH: 27.7 pg (ref 26.0–34.0)
MCHC: 32.3 g/dL (ref 30.0–36.0)
MCV: 85.8 fL (ref 78.0–100.0)
Platelets: 306 10*3/uL (ref 150–400)
RBC: 4.94 MIL/uL (ref 3.87–5.11)
RDW: 15 % (ref 11.5–15.5)
WBC: 11.4 10*3/uL — ABNORMAL HIGH (ref 4.0–10.5)

## 2017-01-21 LAB — SURGICAL PCR SCREEN
MRSA, PCR: NEGATIVE
Staphylococcus aureus: POSITIVE — AB

## 2017-01-21 LAB — GLUCOSE, CAPILLARY: Glucose-Capillary: 123 mg/dL — ABNORMAL HIGH (ref 65–99)

## 2017-01-21 NOTE — Progress Notes (Signed)
PCP - Dr. Danise Mina Cardiologist - Dr. Rockey Situ pt states she has not seen him since 2016  Chest x-ray - 07/20/16 EKG - 01/04/17 Stress Test - 2011 ECHO - 05/17/10 Cardiac Cath - 2000- pt reports normal cath in 2000, unsure which MD ordered this for her   Sleep Study - 10/05/12 CPAP - no  Fasting Blood Sugar - 140-160, last A1c 8.3   Patient denies shortness of breath, fever, cough and chest pain at PAT appointment   Patient verbalized understanding of instructions that were given to them at the PAT appointment. Patient was also instructed that they will need to review over the PAT instructions again at home before surgery.

## 2017-01-22 NOTE — Progress Notes (Signed)
Anesthesia Chart Review:  Pt is a 56 year old female scheduled for lumbar spinal cord stimulator insertion on 01/24/2017 with Melina Schools, MD  - PCP is Ria Bush, MD who cleared pt for surgery.  - Saw cardiologist Ida Rogue, MD in 2016 for palpitations. Event monitor ordered, results below. Pt does not routinely see cardiology.   PMH includes:  HTN, palpitations, DM, hyperlipidemia, OSA, post-up N/V, GERD. Former smoker. BMI 49.5.  Medications include: ASA 81 mg, cardia XT, glimepiride, HCTZ, metoprolol, Prilosec, propranolol, rosuvastatin, Onglyza  BP 135/85   Pulse 76   Temp 36.9 C   Resp 20   Ht 5\' 5"  (1.651 m)   Wt 297 lb 12.8 oz (135.1 kg)   LMP  (Within Years) Comment: 2 years ago  SpO2 98%   BMI 49.56 kg/m    Preoperative labs reviewed.  Glucose 173. HbA1c was 8.3 on 01/04/17  CXR 07/20/16:  1. The lungs appear clear. 2. Mild enlargement of the cardiopericardial silhouette, without edema. 3. Mild thoracic spondylosis.  EKG 01/04/17: NSR rate 75, normal axis, intervals, nonspecific anterior ST flattening with poor R wave progression new from prior EKG 2016, no acute ST/T  Cardiac event monitor 10/20/14: NSR with no significant arrhythmia.   If no changes, I anticipate pt can proceed with surgery as scheduled.   Willeen Cass, FNP-BC [facility]/Anesthesiology Phone: [phone] 01/22/2017 11:41 AM

## 2017-01-23 MED ORDER — VANCOMYCIN HCL 10 G IV SOLR
1500.0000 mg | INTRAVENOUS | Status: AC
  Administered 2017-01-24: 1500 mg via INTRAVENOUS
  Filled 2017-01-23: qty 1500

## 2017-01-24 ENCOUNTER — Encounter (HOSPITAL_COMMUNITY)
Admission: AD | Disposition: A | Discharge status: Home / Self Care | Payer: Self-pay | Source: Ambulatory Visit | Attending: Orthopedic Surgery

## 2017-01-24 ENCOUNTER — Ambulatory Visit (HOSPITAL_COMMUNITY): Payer: 59 | Admitting: Emergency Medicine

## 2017-01-24 ENCOUNTER — Ambulatory Visit (HOSPITAL_COMMUNITY): Payer: 59

## 2017-01-24 ENCOUNTER — Inpatient Hospital Stay (HOSPITAL_COMMUNITY)
Admission: AD | Admit: 2017-01-24 | Discharge: 2017-01-25 | DRG: 030 | Disposition: A | Discharge status: Home / Self Care | Payer: 59 | Source: Ambulatory Visit | Attending: Orthopedic Surgery | Admitting: Orthopedic Surgery

## 2017-01-24 ENCOUNTER — Encounter (HOSPITAL_COMMUNITY): Payer: Self-pay | Admitting: *Deleted

## 2017-01-24 ENCOUNTER — Ambulatory Visit (HOSPITAL_COMMUNITY): Payer: 59 | Admitting: Anesthesiology

## 2017-01-24 DIAGNOSIS — M4604 Spinal enthesopathy, thoracic region: Secondary | ICD-10-CM | POA: Diagnosis present

## 2017-01-24 DIAGNOSIS — Z8249 Family history of ischemic heart disease and other diseases of the circulatory system: Secondary | ICD-10-CM | POA: Diagnosis not present

## 2017-01-24 DIAGNOSIS — E7800 Pure hypercholesterolemia, unspecified: Secondary | ICD-10-CM | POA: Diagnosis not present

## 2017-01-24 DIAGNOSIS — M5030 Other cervical disc degeneration, unspecified cervical region: Secondary | ICD-10-CM | POA: Diagnosis present

## 2017-01-24 DIAGNOSIS — M7052 Other bursitis of knee, left knee: Secondary | ICD-10-CM | POA: Diagnosis present

## 2017-01-24 DIAGNOSIS — M545 Low back pain: Secondary | ICD-10-CM | POA: Diagnosis not present

## 2017-01-24 DIAGNOSIS — G894 Chronic pain syndrome: Secondary | ICD-10-CM | POA: Diagnosis not present

## 2017-01-24 DIAGNOSIS — G4733 Obstructive sleep apnea (adult) (pediatric): Secondary | ICD-10-CM | POA: Diagnosis present

## 2017-01-24 DIAGNOSIS — Z560 Unemployment, unspecified: Secondary | ICD-10-CM | POA: Diagnosis not present

## 2017-01-24 DIAGNOSIS — T85192A Other mechanical complication of implanted electronic neurostimulator (electrode) of spinal cord, initial encounter: Secondary | ICD-10-CM | POA: Diagnosis not present

## 2017-01-24 DIAGNOSIS — G8929 Other chronic pain: Secondary | ICD-10-CM | POA: Diagnosis not present

## 2017-01-24 DIAGNOSIS — E119 Type 2 diabetes mellitus without complications: Secondary | ICD-10-CM | POA: Diagnosis present

## 2017-01-24 DIAGNOSIS — M961 Postlaminectomy syndrome, not elsewhere classified: Secondary | ICD-10-CM | POA: Diagnosis not present

## 2017-01-24 DIAGNOSIS — Z833 Family history of diabetes mellitus: Secondary | ICD-10-CM | POA: Diagnosis not present

## 2017-01-24 DIAGNOSIS — Z880 Allergy status to penicillin: Secondary | ICD-10-CM

## 2017-01-24 DIAGNOSIS — Z87891 Personal history of nicotine dependence: Secondary | ICD-10-CM

## 2017-01-24 DIAGNOSIS — M5136 Other intervertebral disc degeneration, lumbar region: Secondary | ICD-10-CM | POA: Diagnosis not present

## 2017-01-24 DIAGNOSIS — Z809 Family history of malignant neoplasm, unspecified: Secondary | ICD-10-CM

## 2017-01-24 DIAGNOSIS — M1712 Unilateral primary osteoarthritis, left knee: Secondary | ICD-10-CM | POA: Diagnosis present

## 2017-01-24 DIAGNOSIS — M797 Fibromyalgia: Secondary | ICD-10-CM | POA: Diagnosis present

## 2017-01-24 DIAGNOSIS — K219 Gastro-esophageal reflux disease without esophagitis: Secondary | ICD-10-CM | POA: Diagnosis present

## 2017-01-24 DIAGNOSIS — Z86010 Personal history of colonic polyps: Secondary | ICD-10-CM

## 2017-01-24 DIAGNOSIS — I10 Essential (primary) hypertension: Secondary | ICD-10-CM | POA: Diagnosis present

## 2017-01-24 DIAGNOSIS — Z9689 Presence of other specified functional implants: Secondary | ICD-10-CM

## 2017-01-24 DIAGNOSIS — Z9889 Other specified postprocedural states: Secondary | ICD-10-CM

## 2017-01-24 DIAGNOSIS — Z419 Encounter for procedure for purposes other than remedying health state, unspecified: Secondary | ICD-10-CM

## 2017-01-24 HISTORY — PX: SPINAL CORD STIMULATOR IMPLANT: SHX2422

## 2017-01-24 HISTORY — DX: Pneumonia, unspecified organism: J18.9

## 2017-01-24 HISTORY — DX: Headache: R51

## 2017-01-24 HISTORY — DX: Migraine, unspecified, not intractable, without status migrainosus: G43.909

## 2017-01-24 HISTORY — DX: Other chronic pain: G89.29

## 2017-01-24 HISTORY — PX: SPINAL CORD STIMULATOR INSERTION: SHX5378

## 2017-01-24 HISTORY — DX: Dorsalgia, unspecified: M54.9

## 2017-01-24 HISTORY — DX: Unspecified osteoarthritis, unspecified site: M19.90

## 2017-01-24 HISTORY — DX: Headache, unspecified: R51.9

## 2017-01-24 LAB — GLUCOSE, CAPILLARY
Glucose-Capillary: 143 mg/dL — ABNORMAL HIGH (ref 65–99)
Glucose-Capillary: 184 mg/dL — ABNORMAL HIGH (ref 65–99)

## 2017-01-24 SURGERY — INSERTION, SPINAL CORD STIMULATOR, LUMBAR
Anesthesia: General

## 2017-01-24 MED ORDER — LIDOCAINE HCL (CARDIAC) 20 MG/ML IV SOLN
INTRAVENOUS | Status: DC | PRN
  Administered 2017-01-24: 20 mg via INTRAVENOUS

## 2017-01-24 MED ORDER — HYDROCHLOROTHIAZIDE 25 MG PO TABS: 25.0000 mg | ORAL_TABLET | Freq: Every day | ORAL | Status: DC | PRN

## 2017-01-24 MED ORDER — PROPOFOL 10 MG/ML IV BOLUS
INTRAVENOUS | Status: AC
  Filled 2017-01-24: qty 20

## 2017-01-24 MED ORDER — ROSUVASTATIN CALCIUM 40 MG PO TABS
40.0000 mg | ORAL_TABLET | Freq: Every day | ORAL | Status: DC
  Administered 2017-01-25: 40 mg via ORAL
  Filled 2017-01-24: qty 1

## 2017-01-24 MED ORDER — THROMBIN 20000 UNITS EX SOLR
CUTANEOUS | Status: DC | PRN
  Administered 2017-01-24: 20000 [IU] via TOPICAL

## 2017-01-24 MED ORDER — 0.9 % SODIUM CHLORIDE (POUR BTL) OPTIME
TOPICAL | Status: DC | PRN
  Administered 2017-01-24: 1000 mL

## 2017-01-24 MED ORDER — HYDROMORPHONE HCL 1 MG/ML IJ SOLN: 0.2500 mg | INTRAMUSCULAR | Status: DC | PRN

## 2017-01-24 MED ORDER — PANTOPRAZOLE SODIUM 40 MG PO TBEC
40.0000 mg | DELAYED_RELEASE_TABLET | Freq: Every day | ORAL | Status: DC
  Administered 2017-01-25: 40 mg via ORAL
  Filled 2017-01-24: qty 1

## 2017-01-24 MED ORDER — VANCOMYCIN HCL IN DEXTROSE 1-5 GM/200ML-% IV SOLN
1000.0000 mg | Freq: Once | INTRAVENOUS | Status: AC
  Administered 2017-01-24: 1000 mg via INTRAVENOUS
  Filled 2017-01-24: qty 200

## 2017-01-24 MED ORDER — ONDANSETRON HCL 4 MG/2ML IJ SOLN
INTRAMUSCULAR | Status: AC
  Filled 2017-01-24: qty 2

## 2017-01-24 MED ORDER — FENTANYL CITRATE (PF) 250 MCG/5ML IJ SOLN
INTRAMUSCULAR | Status: AC
  Filled 2017-01-24: qty 5

## 2017-01-24 MED ORDER — LACTATED RINGERS IV SOLN: INTRAVENOUS | Status: DC

## 2017-01-24 MED ORDER — SUCCINYLCHOLINE CHLORIDE 200 MG/10ML IV SOSY
PREFILLED_SYRINGE | INTRAVENOUS | Status: AC
  Filled 2017-01-24: qty 10

## 2017-01-24 MED ORDER — DOCUSATE SODIUM 100 MG PO CAPS
100.0000 mg | ORAL_CAPSULE | Freq: Two times a day (BID) | ORAL | Status: DC
  Administered 2017-01-24 – 2017-01-25 (×2): 100 mg via ORAL
  Filled 2017-01-24 (×2): qty 1

## 2017-01-24 MED ORDER — LACTATED RINGERS IV SOLN
INTRAVENOUS | Status: DC
  Administered 2017-01-24 (×2): via INTRAVENOUS

## 2017-01-24 MED ORDER — MENTHOL 3 MG MT LOZG: 1.0000 | LOZENGE | OROMUCOSAL | Status: DC | PRN

## 2017-01-24 MED ORDER — ACETAMINOPHEN 10 MG/ML IV SOLN
1000.0000 mg | INTRAVENOUS | Status: AC
  Administered 2017-01-24: 1000 mg via INTRAVENOUS
  Filled 2017-01-24: qty 100

## 2017-01-24 MED ORDER — DEXAMETHASONE SODIUM PHOSPHATE 10 MG/ML IJ SOLN
INTRAMUSCULAR | Status: AC
  Filled 2017-01-24: qty 1

## 2017-01-24 MED ORDER — DEXAMETHASONE SODIUM PHOSPHATE 4 MG/ML IJ SOLN
INTRAMUSCULAR | Status: DC | PRN
  Administered 2017-01-24: 4 mg via INTRAVENOUS

## 2017-01-24 MED ORDER — SCOPOLAMINE 1 MG/3DAYS TD PT72
MEDICATED_PATCH | TRANSDERMAL | Status: AC
  Filled 2017-01-24: qty 1

## 2017-01-24 MED ORDER — ONDANSETRON HCL 4 MG/2ML IJ SOLN
INTRAMUSCULAR | Status: DC | PRN
  Administered 2017-01-24: 4 mg via INTRAVENOUS

## 2017-01-24 MED ORDER — POLYETHYLENE GLYCOL 3350 17 G PO PACK: 17.0000 g | PACK | Freq: Every day | ORAL | Status: DC | PRN

## 2017-01-24 MED ORDER — BUPROPION HCL ER (SR) 150 MG PO TB12
150.0000 mg | ORAL_TABLET | Freq: Two times a day (BID) | ORAL | Status: DC
  Administered 2017-01-24 – 2017-01-25 (×2): 150 mg via ORAL
  Filled 2017-01-24 (×2): qty 1

## 2017-01-24 MED ORDER — ROCURONIUM BROMIDE 100 MG/10ML IV SOLN
INTRAVENOUS | Status: DC | PRN
  Administered 2017-01-24: 30 mg via INTRAVENOUS

## 2017-01-24 MED ORDER — SODIUM CHLORIDE 0.9% FLUSH: 3.0000 mL | INTRAVENOUS | Status: DC | PRN

## 2017-01-24 MED ORDER — SODIUM CHLORIDE 0.9% FLUSH
3.0000 mL | Freq: Two times a day (BID) | INTRAVENOUS | Status: DC
  Administered 2017-01-24: 3 mL via INTRAVENOUS

## 2017-01-24 MED ORDER — SCOPOLAMINE 1 MG/3DAYS TD PT72
MEDICATED_PATCH | TRANSDERMAL | Status: DC | PRN
  Administered 2017-01-24: 1 via TRANSDERMAL

## 2017-01-24 MED ORDER — HYDROMORPHONE HCL 2 MG PO TABS
2.0000 mg | ORAL_TABLET | ORAL | Status: DC | PRN
  Administered 2017-01-24 – 2017-01-25 (×4): 2 mg via ORAL
  Filled 2017-01-24 (×4): qty 1

## 2017-01-24 MED ORDER — MIDAZOLAM HCL 2 MG/2ML IJ SOLN: 0.5000 mg | Freq: Once | INTRAMUSCULAR | Status: DC | PRN

## 2017-01-24 MED ORDER — BUPIVACAINE-EPINEPHRINE (PF) 0.25% -1:200000 IJ SOLN
INTRAMUSCULAR | Status: AC
  Filled 2017-01-24: qty 30

## 2017-01-24 MED ORDER — LINAGLIPTIN 5 MG PO TABS
5.0000 mg | ORAL_TABLET | Freq: Every day | ORAL | Status: DC
  Administered 2017-01-24 – 2017-01-25 (×2): 5 mg via ORAL
  Filled 2017-01-24 (×2): qty 1

## 2017-01-24 MED ORDER — PROPOFOL 10 MG/ML IV BOLUS
INTRAVENOUS | Status: DC | PRN
  Administered 2017-01-24: 40 mg via INTRAVENOUS
  Administered 2017-01-24: 160 mg via INTRAVENOUS

## 2017-01-24 MED ORDER — MIDAZOLAM HCL 2 MG/2ML IJ SOLN
INTRAMUSCULAR | Status: AC
  Filled 2017-01-24: qty 2

## 2017-01-24 MED ORDER — PHENOL 1.4 % MT LIQD: 1.0000 | OROMUCOSAL | Status: DC | PRN

## 2017-01-24 MED ORDER — THROMBIN 20000 UNITS EX SOLR
CUTANEOUS | Status: AC
  Filled 2017-01-24: qty 20000

## 2017-01-24 MED ORDER — GLIMEPIRIDE 4 MG PO TABS
4.0000 mg | ORAL_TABLET | Freq: Every day | ORAL | Status: DC
  Administered 2017-01-25: 4 mg via ORAL
  Filled 2017-01-24: qty 1

## 2017-01-24 MED ORDER — METOPROLOL SUCCINATE ER 100 MG PO TB24
100.0000 mg | ORAL_TABLET | Freq: Two times a day (BID) | ORAL | Status: DC
  Administered 2017-01-24 – 2017-01-25 (×2): 100 mg via ORAL
  Filled 2017-01-24 (×2): qty 1

## 2017-01-24 MED ORDER — PHENYLEPHRINE 40 MCG/ML (10ML) SYRINGE FOR IV PUSH (FOR BLOOD PRESSURE SUPPORT)
PREFILLED_SYRINGE | INTRAVENOUS | Status: AC
  Filled 2017-01-24: qty 10

## 2017-01-24 MED ORDER — MIDAZOLAM HCL 5 MG/5ML IJ SOLN
INTRAMUSCULAR | Status: DC | PRN
  Administered 2017-01-24: 2 mg via INTRAVENOUS

## 2017-01-24 MED ORDER — MEPERIDINE HCL 25 MG/ML IJ SOLN: 6.2500 mg | INTRAMUSCULAR | Status: DC | PRN

## 2017-01-24 MED ORDER — PHENYLEPHRINE HCL 10 MG/ML IJ SOLN
INTRAMUSCULAR | Status: DC | PRN
  Administered 2017-01-24: 80 ug via INTRAVENOUS

## 2017-01-24 MED ORDER — DILTIAZEM HCL ER COATED BEADS 180 MG PO CP24
180.0000 mg | ORAL_CAPSULE | Freq: Every day | ORAL | Status: DC
  Administered 2017-01-25: 180 mg via ORAL
  Filled 2017-01-24: qty 1

## 2017-01-24 MED ORDER — SODIUM CHLORIDE 0.9 % IV SOLN: 250.0000 mL | INTRAVENOUS | Status: DC

## 2017-01-24 MED ORDER — FENTANYL CITRATE (PF) 100 MCG/2ML IJ SOLN
INTRAMUSCULAR | Status: DC | PRN
  Administered 2017-01-24: 50 ug via INTRAVENOUS
  Administered 2017-01-24: 25 ug via INTRAVENOUS
  Administered 2017-01-24: 250 ug via INTRAVENOUS
  Administered 2017-01-24: 25 ug via INTRAVENOUS

## 2017-01-24 MED ORDER — ONDANSETRON HCL 4 MG PO TABS: 4.0000 mg | ORAL_TABLET | Freq: Four times a day (QID) | ORAL | Status: DC | PRN

## 2017-01-24 MED ORDER — SUGAMMADEX SODIUM 500 MG/5ML IV SOLN
INTRAVENOUS | Status: AC
  Filled 2017-01-24: qty 5

## 2017-01-24 MED ORDER — ONDANSETRON HCL 4 MG/2ML IJ SOLN
4.0000 mg | Freq: Four times a day (QID) | INTRAMUSCULAR | Status: DC | PRN
  Administered 2017-01-24: 4 mg via INTRAVENOUS
  Filled 2017-01-24: qty 2

## 2017-01-24 MED ORDER — PROPRANOLOL HCL 10 MG PO TABS: 20.0000 mg | ORAL_TABLET | Freq: Three times a day (TID) | ORAL | Status: DC | PRN

## 2017-01-24 MED ORDER — LIDOCAINE 2% (20 MG/ML) 5 ML SYRINGE
INTRAMUSCULAR | Status: AC
  Filled 2017-01-24: qty 5

## 2017-01-24 MED ORDER — SUGAMMADEX SODIUM 500 MG/5ML IV SOLN
INTRAVENOUS | Status: DC | PRN
  Administered 2017-01-24: 270.2 mg via INTRAVENOUS

## 2017-01-24 MED ORDER — BUPIVACAINE-EPINEPHRINE 0.25% -1:200000 IJ SOLN
INTRAMUSCULAR | Status: DC | PRN
  Administered 2017-01-24: 20 mL

## 2017-01-24 MED ORDER — HEMOSTATIC AGENTS (NO CHARGE) OPTIME
TOPICAL | Status: DC | PRN
  Administered 2017-01-24: 1 via TOPICAL

## 2017-01-24 MED ORDER — ROCURONIUM BROMIDE 10 MG/ML (PF) SYRINGE
PREFILLED_SYRINGE | INTRAVENOUS | Status: AC
  Filled 2017-01-24: qty 5

## 2017-01-24 MED ORDER — MAGNESIUM CITRATE PO SOLN: 1.0000 | Freq: Once | ORAL | Status: DC | PRN

## 2017-01-24 MED ORDER — SUCCINYLCHOLINE CHLORIDE 20 MG/ML IJ SOLN
INTRAMUSCULAR | Status: DC | PRN
  Administered 2017-01-24: 140 mg via INTRAVENOUS

## 2017-01-24 SURGICAL SUPPLY — 58 items
CANISTER SUCT 3000ML PPV (MISCELLANEOUS) ×3 IMPLANT
CLOSURE STERI-STRIP 1/2X4 (GAUZE/BANDAGES/DRESSINGS) ×2
CLSR STERI-STRIP ANTIMIC 1/2X4 (GAUZE/BANDAGES/DRESSINGS) ×3 IMPLANT
CONTROLLER PROCLAIM IPG E5 PAT (Neuro Prosthesis/Implant) ×2 IMPLANT
CORD BIPOLAR FORCEPS 12FT (ELECTRODE) ×2 IMPLANT
COVER PROBE W GEL 5X96 (DRAPES) IMPLANT
COVER SURGICAL LIGHT HANDLE (MISCELLANEOUS) ×3 IMPLANT
DRAPE C-ARM 42X72 X-RAY (DRAPES) ×3 IMPLANT
DRAPE C-ARMOR (DRAPES) ×2 IMPLANT
DRAPE INCISE IOBAN 85X60 (DRAPES) ×3 IMPLANT
DRAPE SURG 17X23 STRL (DRAPES) ×3 IMPLANT
DRAPE U-SHAPE 47X51 STRL (DRAPES) ×3 IMPLANT
DRSG AQUACEL AG ADV 3.5X 6 (GAUZE/BANDAGES/DRESSINGS) ×6 IMPLANT
DRSG OPSITE POSTOP 3X4 (GAUZE/BANDAGES/DRESSINGS) ×2 IMPLANT
DRSG OPSITE POSTOP 4X8 (GAUZE/BANDAGES/DRESSINGS) ×2 IMPLANT
DURAPREP 26ML APPLICATOR (WOUND CARE) ×3 IMPLANT
ELECT BLADE 4.0 EZ CLEAN MEGAD (MISCELLANEOUS)
ELECT CAUTERY BLADE 6.4 (BLADE) ×3 IMPLANT
ELECT PENCIL ROCKER SW 15FT (MISCELLANEOUS) ×3 IMPLANT
ELECT REM PT RETURN 9FT ADLT (ELECTROSURGICAL) ×3
ELECTRODE BLDE 4.0 EZ CLN MEGD (MISCELLANEOUS) IMPLANT
ELECTRODE REM PT RTRN 9FT ADLT (ELECTROSURGICAL) ×1 IMPLANT
GLOVE BIO SURGEON STRL SZ7 (GLOVE) ×3 IMPLANT
GLOVE BIOGEL PI IND STRL 7.0 (GLOVE) ×1 IMPLANT
GLOVE BIOGEL PI IND STRL 8.5 (GLOVE) ×1 IMPLANT
GLOVE BIOGEL PI INDICATOR 7.0 (GLOVE) ×2
GLOVE BIOGEL PI INDICATOR 8.5 (GLOVE) ×2
GLOVE SS N UNI LF 8.5 STRL (GLOVE) ×6 IMPLANT
GOWN STRL REUS W/ TWL LRG LVL3 (GOWN DISPOSABLE) ×2 IMPLANT
GOWN STRL REUS W/TWL 2XL LVL3 (GOWN DISPOSABLE) ×3 IMPLANT
GOWN STRL REUS W/TWL LRG LVL3 (GOWN DISPOSABLE) ×6
KIT BASIN OR (CUSTOM PROCEDURE TRAY) ×3 IMPLANT
KIT ROOM TURNOVER OR (KITS) ×3 IMPLANT
LEAD LAMITRODE 60CM 8CH (Lead) ×4 IMPLANT
NDL SPNL 18GX3.5 QUINCKE PK (NEEDLE) ×1 IMPLANT
NEEDLE 22X1 1/2 (OR ONLY) (NEEDLE) ×3 IMPLANT
NEEDLE SPNL 18GX3.5 QUINCKE PK (NEEDLE) ×3 IMPLANT
NS IRRIG 1000ML POUR BTL (IV SOLUTION) ×3 IMPLANT
PACK LAMINECTOMY ORTHO (CUSTOM PROCEDURE TRAY) ×3 IMPLANT
PACK UNIVERSAL I (CUSTOM PROCEDURE TRAY) ×3 IMPLANT
PAD ARMBOARD 7.5X6 YLW CONV (MISCELLANEOUS) ×6 IMPLANT
SPATULA SILICONE BRAIN 10MM (MISCELLANEOUS) ×2 IMPLANT
SPONGE LAP 4X18 X RAY DECT (DISPOSABLE) IMPLANT
SPONGE SURGIFOAM ABS GEL 100 (HEMOSTASIS) ×3 IMPLANT
STAPLER VISISTAT 35W (STAPLE) ×3 IMPLANT
SURGIFLO W/THROMBIN 8M KIT (HEMOSTASIS) ×3 IMPLANT
SUT BONE WAX W31G (SUTURE) ×3 IMPLANT
SUT ETHIBOND 2 OS 4 DA (SUTURE) ×3 IMPLANT
SUT MNCRL AB 3-0 PS2 18 (SUTURE) ×6 IMPLANT
SUT VIC AB 1 CT1 18XCR BRD 8 (SUTURE) ×2 IMPLANT
SUT VIC AB 1 CT1 8-18 (SUTURE) ×9
SUT VIC AB 2-0 CT1 18 (SUTURE) ×3 IMPLANT
SYR BULB IRRIGATION 50ML (SYRINGE) ×3 IMPLANT
SYR CONTROL 10ML LL (SYRINGE) ×3 IMPLANT
TOOL TUNNELING 20 (MISCELLANEOUS) ×2 IMPLANT
TOWEL OR 17X24 6PK STRL BLUE (TOWEL DISPOSABLE) ×3 IMPLANT
TOWEL OR 17X26 10 PK STRL BLUE (TOWEL DISPOSABLE) ×3 IMPLANT
WATER STERILE IRR 1000ML POUR (IV SOLUTION) ×1 IMPLANT

## 2017-01-24 NOTE — Anesthesia Preprocedure Evaluation (Addendum)
Anesthesia Evaluation  Patient identified by MRN, date of birth, ID band Patient awake    Reviewed: Allergy & Precautions, NPO status , Patient's Chart, lab work & pertinent test results  History of Anesthesia Complications (+) PONV  Airway Mallampati: I  TM Distance: >3 FB Neck ROM: Full    Dental  (+) Dental Advisory Given   Pulmonary shortness of breath, sleep apnea (does not use her CPAP) , former smoker,    breath sounds clear to auscultation       Cardiovascular hypertension, Pt. on home beta blockers and Pt. on medications (-) angina Rhythm:Regular Rate:Normal  '07 ECHO: EF 55-65%, valves OK   Neuro/Psych Depression DDD, chronic back pain    GI/Hepatic Neg liver ROS, GERD  Medicated and Controlled,  Endo/Other  diabetes (glu 143), Oral Hypoglycemic AgentsMorbid obesity  Renal/GU negative Renal ROS     Musculoskeletal  (+) Arthritis ,   Abdominal (+) + obese,   Peds  Hematology negative hematology ROS (+)   Anesthesia Other Findings   Reproductive/Obstetrics                            Anesthesia Physical Anesthesia Plan  ASA: III  Anesthesia Plan: General   Post-op Pain Management:    Induction: Intravenous  PONV Risk Score and Plan: 4 or greater and Ondansetron, Dexamethasone, Midazolam and Scopolamine patch - Pre-op  Airway Management Planned: Oral ETT  Additional Equipment:   Intra-op Plan:   Post-operative Plan: Extubation in OR  Informed Consent: I have reviewed the patients History and Physical, chart, labs and discussed the procedure including the risks, benefits and alternatives for the proposed anesthesia with the patient or authorized representative who has indicated his/her understanding and acceptance.   Dental advisory given  Plan Discussed with: CRNA and Surgeon  Anesthesia Plan Comments: (Plan routine monitors, GETA)        Anesthesia Quick  Evaluation

## 2017-01-24 NOTE — Discharge Instructions (Signed)
Lumbar Diskectomy, Care After °Refer to this sheet in the next few weeks. These instructions provide you with information about caring for yourself after your procedure. Your health care provider may also give you more specific instructions. Your treatment has been planned according to current medical practices, but problems sometimes occur. Call your health care provider if you have any problems or questions after your procedure. °What can I expect after the procedure? °After the procedure, it is common to have: °· Pain. °· Numbness. °· Weakness. ° °Follow these instructions at home: °Medicines °· Take medicines only as directed by your health care provider. °· If you were prescribed an antibiotic medicine, finish all of it even if you start to feel better. °Incision care °· There are many different ways to close and cover an incision, including stitches (sutures), skin glue, and adhesive strips. Follow your health care provider's instructions about: °? Incision care. °? Bandage (dressing) changes and removal. °? Incision closure removal. °· Check your incision area every day for signs of infection. If you cannot see your incision, have someone check it for you. Watch for: °? Redness, swelling, or pain. °? Fluid, blood, or pus. °Activity °· Avoid sitting for longer than 20 minutes at a time or as directed by your health care provider. °· Do not climb stairs more than once each day until your health care provider approves. °· Do not bend at your waist. To pick things up, bend your knees. °· Do not lift anything that is heavier than 10 lb (4.5 kg) or as directed by your health care provider. °· Do not drive a car until your health care provider approves. °· Ask your health care provider when you may return to your normal activities, such as playing sports and going back to work. °· Work with your physical therapist to learn safe movement and exercises to help healing. Do these exercises as directed. °· Take short  walks often. °General instructions °· Do not use any tobacco products, including cigarettes, chewing tobacco, or electronic cigarettes. If you need help quitting, ask your health care provider. °· Follow your health care provider’s instructions about bathing. Do not take baths, shower, swim, or use a hot tub until your health care provider approves. °· Wear your back brace as directed by your health care provider. °· To prevent constipation: °? Drink enough fluid to keep your urine clear or pale yellow. °? Eat plenty of fruits, vegetables, and whole grains. °· Keep all follow-up visits as directed by your health care provider. This is important. This includes any follow-up visits with your physical therapist. °Contact a health care provider if: °· You have a fever. °· You have redness, swelling, or pain in your incision area. °· Your pain is not controlled with medicine. °· You have pain, numbness, or weakness that lasts longer than three weeks after surgery. °· You become constipated. °Get help right away if: °· You have fluid, blood, or pus coming from your incision. °· You have increasing pain, numbness, or weakness. °· You lose control of when you urinate or have a bowel movement (incontinence). °· You have chest pain. °· You have trouble breathing. °This information is not intended to replace advice given to you by your health care provider. Make sure you discuss any questions you have with your health care provider. °Document Released: 05/16/2004 Document Revised: 11/17/2015 Document Reviewed: 02/03/2014 °Elsevier Interactive Patient Education © 2018 [employer] ° °

## 2017-01-24 NOTE — Brief Op Note (Signed)
01/24/2017  1:26 PM  PATIENT:  Kathleen Good  56 y.o. female  PRE-OPERATIVE DIAGNOSIS:  Chronic back pain  POST-OPERATIVE DIAGNOSIS:  Chronic back pain  PROCEDURE:  Procedure(s) with comments: LUMBAR SPINAL CORD STIMULATOR INSERTION (N/A) - 2.5 hrs  SURGEON:  Surgeon(s) and Role:    Melina Schools, MD - Primary  PHYSICIAN ASSISTANT:   ASSISTANTS: Carmen Mayo   ANESTHESIA:   general  EBL:  Total I/O In: 1000 [I.V.:1000] Out: 30 [Blood:30]  BLOOD ADMINISTERED:none  DRAINS: none   LOCAL MEDICATIONS USED:  MARCAINE     SPECIMEN:  No Specimen  DISPOSITION OF SPECIMEN:  N/A  COUNTS:  YES  TOURNIQUET:  * No tourniquets in log *  DICTATION: .Dragon Dictation  PLAN OF CARE: Admit to inpatient   PATIENT DISPOSITION:  PACU - hemodynamically stable.

## 2017-01-24 NOTE — Transfer of Care (Signed)
Immediate Anesthesia Transfer of Care Note  Patient: Kathleen Good  Procedure(s) Performed: Procedure(s) with comments: LUMBAR SPINAL CORD STIMULATOR INSERTION (N/A) - 2.5 hrs  Patient Location: PACU  Anesthesia Type:General  Level of Consciousness: awake, alert , oriented and patient cooperative  Airway & Oxygen Therapy: Patient Spontanous Breathing and Patient connected to face mask oxygen  Post-op Assessment: Report given to RN and Post -op Vital signs reviewed and stable  Post vital signs: Reviewed and stable  Last Vitals:  Vitals:   01/24/17 0911  BP: 133/77  Pulse: 77  Resp: 20  Temp: 36.8 C    Last Pain:  Vitals:   01/24/17 0911  TempSrc: Oral      Patients Stated Pain Goal: 3 (30/09/23 3007)  Complications: No apparent anesthesia complications

## 2017-01-24 NOTE — Progress Notes (Signed)
Upon exiting bed to chair, awre r hand PIV had infilotrated/ immediately removed same along with id bands/ applied hot pack to same/ 2nd PIV started by crna l hand- see note

## 2017-01-24 NOTE — H&P (Signed)
History of Present Illness  The patient is a 56 year old female who presents for a follow-up for H & P. The patient is scheduled for a SCS placement to be performed by Dr. Duane Lope D. Rolena Infante, MD at [facility] on 01-24-17 . Please see the hospital record for complete dictated history and physical. the pt has DM2 the last A1c was 8.3. Pt also has high cholesterol. Her doctor has recently changed her cholesterol meds and increased her Dm medications. Pt understands that blood sugar contol is important for healing.   Problem List/Past Medical  Other cervical disc degeneration at C4-C5 level (M50.321)  Primary localized osteoarthritis of left knee (M17.12)  Quadriceps weakness (M62.81)  Derangement of posterior horn of medial meniscus of left knee (M23.322)  Chronic pain of left knee (M25.562)  Failed back syndrome of cervical spine (M96.1)  Degenerative lumbar disc (M51.36)  Thoracic spine pain (M54.6)  Cervical pain (M54.2)  Pes anserinus bursitis of left knee (M70.52)  Problems Reconciled   Allergies Codeine Phosphate  Amoxicillin *PENICILLINS*  Allergies Reconciled   Family History Cancer  Father. Congestive Heart Failure  Paternal Grandmother. Diabetes Mellitus  Maternal Grandmother. First Degree Relatives  reported Heart Disease  Father, Paternal Grandfather. Heart disease in female family member before age 65  Hypertension  Father.  Social History  Tobacco use  Former smoker. Tobacco / smoke exposure  Children  1 Current drinker  04/29/2015: Currently drinks hard liquor only occasionally per week Current work status  unemployed Exercise  Exercises never Living situation  live with spouse Marital status  married No history of drug/alcohol rehab  Not under pain contract  Number of flights of stairs before winded  less than 1  Medication History  Onglyza (5MG  Tablet, Oral) Active. Cyclobenzaprine HCl (10MG  Tablet, 1 (one) Oral three  times daily, as needed, Taken starting 09/28/2016) Active. TraMADol HCl (50MG  Tablet, Oral) Active. Metoprolol Succinate ER (100MG  Tablet ER 24HR, Oral) Active. Wellbutrin (100MG  Tablet, Oral) Active. (bid) Omeprazole (20MG  Capsule DR, Oral) Active. (bid) Cartia XT (180MG  Capsule ER 24HR, Oral) Active. (qd) Atorvastatin Calcium (80MG  Tablet, Oral) Active. Glimepiride (4MG  Tablet, Oral) Active. (qd) Hydrochlorothiazide (25MG  Tablet, Oral) Active. (prn) Vitamin D (1000UNIT Tablet, Oral) Active. (qd) Vitamin B12 (1000MCG Tablet ER, Oral) Active. (qd) Clobetasol Propionate (0.05% Cream, External) Active. Medications Reconciled  Past Surgical History Arthroscopy of Shoulder  left Neck Disc Surgery  Other Orthopaedic Surgery  Tonsillectomy  Tubal Ligation   Other Problems  Anemia  Autoimmune disorder  Sister. Cardiac Arrhythmia  Chronic Pain  Depression  Diabetes Mellitus, Type II  Fibromyalgia  High blood pressure  Hypercholesterolemia  Migraine Headache  Osteoarthritis  Sleep Apnea   Vitals 01/17/2017 8:18 AM Weight: 300 lb Height: 65.5in Body Surface Area: 2.36 m Body Mass Index: 49.16 kg/m  Temp.: 97.52F(Oral)  BP: 96/83 (Sitting, Left Arm, Standard)  General General Appearance-Not in acute distress. Orientation-Oriented X3. Build & Nutrition-Well nourished and Well developed.  Integumentary General Characteristics Surgical Scars - no surgical scar evidence of previous lumbar surgery. Lumbar Spine-Skin examination of the lumbar spine is without deformity, skin lesions, lacerations or abrasions.  Chest and Lung Exam Auscultation Breath sounds - Normal and Clear.  Cardiovascular Auscultation Rhythm - Regular rate and rhythm.  Abdomen Palpation/Percussion Palpation and Percussion of the abdomen reveal - Soft, Non Tender and No Rebound tenderness.  Peripheral Vascular Lower Extremity Palpation - Posterior  tibial pulse - Bilateral - 2+. Dorsalis pedis pulse - Bilateral - 2+.  Neurologic Sensation  Lower Extremity - Bilateral - sensation is intact in the lower extremity. Reflexes Patellar Reflex - Bilateral - 2+. Achilles Reflex - Bilateral - 2+. Clonus - Bilateral - clonus not present. Hoffman's Sign - Bilateral - Hoffman's sign not present. Testing Seated Straight Leg Raise - Bilateral - Seated straight leg raise negative.  Musculoskeletal Spine/Ribs/Pelvis  Lumbosacral Spine: Inspection and Palpation - Tenderness - left lumbar paraspinals tender to palpation and right lumbar paraspinals tender to palpation. Strength and Tone: Strength - Hip Flexion - Bilateral - 4-/5. Knee Extension - Bilateral - 5/5. Knee Flexion - Bilateral - 5/5. Ankle Dorsiflexion - Bilateral - 5/5. Ankle Plantarflexion - Bilateral - 5/5. Heel walk - Bilateral - able to heel walk with moderate difficulty. Toe Walk - Bilateral - able to walk on toes with moderate difficulty. ROM - Flexion - moderately decreased range of motion and painful. Extension - moderately decreased range of motion and painful. Left Lateral Bending - moderately decreased range of motion and painful. Right Lateral Bending - moderately decreased range of motion and painful. Right Rotation - moderately decreased range of motion and painful. Left Rotation - moderately decreased range of motion and painful. Pain - neither flexion or extension is more painful than the other. Lumbosacral Spine - Waddell's Signs - no Waddell's signs present. Lower Extremity Range of Motion - No true hip, knee or ankle pain with range of motion. Gait and Station - [employer] - no assistive devices.  RADIOGRAPHS Thoracic MRI completed on 12/01/2016 demonstrates the previous cervical fusion, which was done in 2005. She has modular thickening of the ligamentum flavum at T10, T11 with no significant central stenosis, no disc herniation, no spinal canal stenosis, no neuroforaminal  narrowing in the thoracic spine.  Plan: At this point in time, the patient has chronic debilitating low back pain. She has had no previous back surgeries. She has neuropathic pain, but no focal radicular weakness in the lower extremities. At this point, she has had multiple injections, radiofrequency ablations. She has been evaluated by Dr. Ronnald Ramp at [facility] who did not feel as though there is any surgical solution to her low back pain. At this point, she had a successful trial of a dorsal column stimulator and presents to me today for permanent implantation. I have reviewed the risks with the patient and her husband. These include infection, bleeding, nerve damage, death, stroke, paralysis, failure to heal, need for further surgery, ongoing or worse pain, migration of the lead, blood clots, bleeding into the chest, need for additional surgery. All of their questions were addressed. They were present for the dictation. We will get preoperative medical clearance from her primary care physician and move forward in a timely fashion.

## 2017-01-24 NOTE — Progress Notes (Signed)
56 YO F s/p spinal surger, on vancomycin for surgical prophylaxis d/t penicillin allergy, pharmacy to adjust dosage base on renal function. Scr 1.18 on 7/30, est. crcl ~ 75 ml/min. Received pre-op dose 1500mg  vancomycin at ~ 1015. No drain per chart  Plan: Vancomycin 1000 mg IV at 2200. Pharmacy sign off.  Thanks.   Maryanna Shape, PharmD, BCPS  Clinical Pharmacist  Pager: [phone]

## 2017-01-24 NOTE — Addendum Note (Signed)
Addendum  created 01/24/17 1550 by Orlie Dakin, CRNA   Anesthesia Intra LDAs edited, LDA properties accepted

## 2017-01-24 NOTE — Anesthesia Procedure Notes (Addendum)
Procedure Name: Intubation Date/Time: 01/24/2017 10:48 AM Performed by: Jenne Campus Pre-anesthesia Checklist: Patient identified, Emergency Drugs available, Suction available and Patient being monitored Patient Re-evaluated:Patient Re-evaluated prior to induction Oxygen Delivery Method: Circle System Utilized Preoxygenation: Pre-oxygenation with 100% oxygen Induction Type: IV induction and Cricoid Pressure applied Laryngoscope Size: Miller and 2 Grade View: Grade II Tube type: Oral Tube size: 7.0 mm Number of attempts: 1 Airway Equipment and Method: Stylet and Bite block Placement Confirmation: ETT inserted through vocal cords under direct vision,  positive ETCO2 and breath sounds checked- equal and bilateral Secured at: 21 cm Tube secured with: Tape Dental Injury: Teeth and Oropharynx as per pre-operative assessment

## 2017-01-24 NOTE — Anesthesia Postprocedure Evaluation (Signed)
Anesthesia Post Note  Patient: Kathleen Good  Procedure(s) Performed: Procedure(s) (LRB): LUMBAR SPINAL CORD STIMULATOR INSERTION (N/A)     Patient location during evaluation: PACU Anesthesia Type: General Level of consciousness: sedated, patient cooperative and oriented Pain management: pain level controlled Vital Signs Assessment: post-procedure vital signs reviewed and stable Respiratory status: spontaneous breathing, nonlabored ventilation, respiratory function stable and patient connected to nasal cannula oxygen Cardiovascular status: blood pressure returned to baseline and stable Postop Assessment: no signs of nausea or vomiting Anesthetic complications: no    Last Vitals:  Vitals:   01/24/17 1400 01/24/17 1426  BP: 133/60 116/64  Pulse:  78  Resp:    Temp: 36.5 C     Last Pain:  Vitals:   01/24/17 1426  TempSrc:   PainSc: Asleep                 Niveah Boerner,E. Britaney Espaillat

## 2017-01-24 NOTE — Op Note (Signed)
Operative note.  Preoperative diagnosis chronic pain syndrome.  Postoperative diagnosis. Same.  Operative procedure. Implantation of spinal cord stimulator.  Complications none condition stable  Education officer, community. [facility].  Implant system used. St. Jude's S8 leed 2 with proclaim  Elite battery.  Indications. This is a pleasant 56 year old woman with chronic debilitating back buttock and bilateral leg pain. Attempts at conservative management fail to alleviate her symptoms. She ultimately had a spinal cord stimulator trial with great success. As a result she was referred to me for permanent implantation.  Operative note.  Patient was brought the operating room placed supine the operating room table. After successful induction of general anesthesia and endotracheal intubation teds SCDs were applied. She was turned prone onto the [place] frame. All bony prominences were well-padded and the back was prepped and draped in standard fashion. Timeout was taken to confirm patient procedure and all other important data.  X-ray was used to identify the T12 vertebral body and then I counted up to identify the T10 vertebral body I infiltrated the midline thoracic incision site and then made an incision and sharply dissected down through the adipose tissue to the deep fascia the deep fascia sharply incised and I stripped the paraspinal muscles to expose the T10-T11 and a portion of T12 spinous process and lamina. I continue to strip the paraspinal muscles out laterally until I had the posterior elements exposed of the spine. Second AP lateral x-rays were taken to confirm the T10 lamina. Once this was confirmed I removed the bulk of the spinous process of T10 with a double-action Leksell rongeur and then used 2 mm Kerrison to perform a laminotomy of T10.  There was significant lateral bone spurs which made expanding the laminotomy laterally quite difficult. I was able to get a good central window to place my  concern as I continued to increase the width of the laminotomy that the spinal cord could be injured.  As a result I elected to put the narrow S8 Leed. This lead freely advanced up without any significant difficulty. In order to improve her overall coverage and the success of the operation I was able to place a second narrow S8 Leed. At this point with both leads in place I took final x-rays and confirmed with the rep from the [place] that I was adequately spanning the area of maximum programming and that the leads were properly positioned. Once I confirm this I then secured the leads to the spinous process of T 11 using an Ethibond suture second incision was made over the right gluteal region and a pocket was created about 4 cm deep. I then used the submuscular passer to advanced the leads from the thoracic to the gluteal wound. I connected it to the pristine H Elite battery and then torqued the locking nut to ensure that it was affixed. The lead the battery was the bili excess leads were tucked underneath the battery and then the battery was placed in the pocket and secured to the deep fascia with 2 #1 Vicryl sutures. We then tested. And all leads were functioning without any difficulty. Final x-rays were satisfactory.  All wounds were then copiously irrigated with normal saline and closed in a layered fashion with interrupted #1 Vicryl, 0 Vicryl, 2-0 Vicryl, and 3-0 Monocryl. Steri-Strips dry dressing were applied and the patient was ultimately extubated transferred the PACU that incident end of the case all needle sponge counts were correct.

## 2017-01-25 ENCOUNTER — Encounter (HOSPITAL_COMMUNITY): Payer: Self-pay | Admitting: Orthopedic Surgery

## 2017-01-25 MED ORDER — HYDROMORPHONE HCL 2 MG PO TABS: 2.0000 mg | ORAL_TABLET | ORAL | 0 refills | Status: DC | PRN

## 2017-01-25 MED ORDER — ONDANSETRON 4 MG PO TBDP: 4.0000 mg | ORAL_TABLET | Freq: Three times a day (TID) | ORAL | 0 refills | Status: DC | PRN

## 2017-01-25 NOTE — Evaluation (Signed)
Physical Therapy Evaluation and Discharge   Patient Details Name: Kathleen Good MRN: [id number] DOB: 1961-06-16 Today's Date: 01/25/2017   History of Present Illness  Pt is a 56 y/o female s/p lumbar spinal cord stimulator implantation. PMH including but not limited to DM and high cholesterol.  Clinical Impression  Pt presented supine in bed with HOB elevated, awake and willing to participate in therapy session. Prior to admission, pt reported that she was independent with all functional mobility and ADLs. Pt ambulated in hallway with supervision without an AD and successfully completed stair training with no concerns. Pt will have assistance from husband upon d/c. No further acute PT needs identified at this time. PT signing off.     Follow Up Recommendations No PT follow up    Equipment Recommendations  None recommended by PT    Recommendations for Other Services       Precautions / Restrictions Restrictions Weight Bearing Restrictions: No      Mobility  Bed Mobility Overal bed mobility: Modified Independent                Transfers Overall transfer level: Modified independent Equipment used: None                Ambulation/Gait Ambulation/Gait assistance: Supervision Ambulation Distance (Feet): 200 Feet Assistive device: None Gait Pattern/deviations: Step-through pattern;Wide base of support Gait velocity: decreased Gait velocity interpretation: Below normal speed for age/gender General Gait Details: no instability or LOB noted, supervision for safety  Stairs Stairs: Yes Stairs assistance: Supervision Stair Management: Two rails;Step to pattern;Forwards Number of Stairs: 2    Wheelchair Mobility    Modified Rankin (Stroke Patients Only)       Balance Overall balance assessment: Needs assistance Sitting-balance support: Feet supported Sitting balance-Leahy Scale: Good     Standing balance support: During functional activity;No upper  extremity supported Standing balance-Leahy Scale: Good                               Pertinent Vitals/Pain Pain Assessment: 0-10 Pain Score: 4  Pain Location: incision site Pain Descriptors / Indicators: Sore Pain Intervention(s): Monitored during session;Repositioned    Home Living Family/patient expects to be discharged to:: Private residence Living Arrangements: Spouse/significant other Available Help at Discharge: Family;Available 24 hours/day Type of Home: Mobile home Home Access: Stairs to enter Entrance Stairs-Rails: Right;Left;Can reach both Entrance Stairs-Number of Steps: 4 Home Layout: One level Home Equipment: Walker - 2 wheels      Prior Function Level of Independence: Independent               Hand Dominance   Dominant Hand: Left    Extremity/Trunk Assessment   Upper Extremity Assessment Upper Extremity Assessment: Overall WFL for tasks assessed    Lower Extremity Assessment Lower Extremity Assessment: Overall WFL for tasks assessed    Cervical / Trunk Assessment Cervical / Trunk Assessment: Other exceptions Cervical / Trunk Exceptions: chronic back pain  Communication   Communication: No difficulties  Cognition Arousal/Alertness: Awake/alert Behavior During Therapy: WFL for tasks assessed/performed Overall Cognitive Status: Within Functional Limits for tasks assessed                                        General Comments      Exercises     Assessment/Plan    PT Assessment  Patent does not need any further PT services  PT Problem List         PT Treatment Interventions      PT Goals (Current goals can be found in the Care Plan section)  Acute Rehab PT Goals Patient Stated Goal: decrease pain    Frequency     Barriers to discharge        Co-evaluation               AM-PAC PT "6 Clicks" Daily Activity  Outcome Measure Difficulty turning over in bed (including adjusting bedclothes,  sheets and blankets)?: None Difficulty moving from lying on back to sitting on the side of the bed? : None Difficulty sitting down on and standing up from a chair with arms (e.g., wheelchair, bedside commode, etc,.)?: None Help needed moving to and from a bed to chair (including a wheelchair)?: None Help needed walking in hospital room?: None Help needed climbing 3-5 steps with a railing? : A Little 6 Click Score: 23    End of Session   Activity Tolerance: Patient tolerated treatment well Patient left: in bed;with call bell/phone within reach Nurse Communication: Mobility status PT Visit Diagnosis: Other abnormalities of gait and mobility (R26.89);Pain Pain - Right/Left: Right Pain - part of body:  (back)    Time: 9381-0175 PT Time Calculation (min) (ACUTE ONLY): 20 min   Charges:   PT Evaluation $PT Eval Moderate Complexity: 1 Mod     PT G Codes:        [place], PT, DPT [place] 01/25/2017, 10:02 AM

## 2017-01-25 NOTE — Evaluation (Signed)
Occupational Therapy Evaluation and Discharge Patient Details Name: Kathleen Good MRN: [id number] DOB: [birth year] Today's Date: 01/25/2017    History of Present Illness Pt is a 56 y/o female s/p lumbar spinal cord stimulator implantation. PMH including but not limited to DM and high cholesterol.   Clinical Impression   PTA Pt independent in ADL and mobility. Pt is currently mod I for all ADL and mobility. Pt educated on back precautions as a way to manage pain and for better biomechanics for back including compensatory strategies for ADL and functional transfers. Pt also educated in [employer] fast streamer and long handle sponge as option for LB bathing/dressing. Pt and husband verbalized understanding and education complete. No further OT needs. Thank you for this referral.     Follow Up Recommendations  No OT follow up    Equipment Recommendations  None recommended by OT    Recommendations for Other Services       Precautions / Restrictions Precautions Precaution Comments: Educated on back precautions to assist with pain and for better biomechanics Restrictions Weight Bearing Restrictions: No      Mobility Bed Mobility Overal bed mobility: Modified Independent                Transfers Overall transfer level: Modified independent Equipment used: None                  Balance Overall balance assessment: Needs assistance Sitting-balance support: Feet supported Sitting balance-Leahy Scale: Good     Standing balance support: During functional activity;No upper extremity supported Standing balance-Leahy Scale: Good                             ADL either performed or assessed with clinical judgement   ADL Overall ADL's : Modified independent                                       General ADL Comments: Pt will have 24 hour assist upon discharge if she would need help. Educated in compensatory strategies for ADL, and use of grabber/reacher  and long handle sponge for LB dressing/bathing     Vision Patient Visual Report: No change from baseline       Perception     Praxis      Pertinent Vitals/Pain Pain Assessment: 0-10 Pain Score: 4  Pain Location: incision site Pain Descriptors / Indicators: Sore Pain Intervention(s): Monitored during session;Repositioned     Hand Dominance Left   Extremity/Trunk Assessment Upper Extremity Assessment Upper Extremity Assessment: Overall WFL for tasks assessed   Lower Extremity Assessment Lower Extremity Assessment: Overall WFL for tasks assessed   Cervical / Trunk Assessment Cervical / Trunk Assessment: Other exceptions Cervical / Trunk Exceptions: chronic back pain   Communication Communication Communication: No difficulties   Cognition Arousal/Alertness: Awake/alert Behavior During Therapy: WFL for tasks assessed/performed Overall Cognitive Status: Within Functional Limits for tasks assessed                                     General Comments  Pt's husband present for session    Exercises     Shoulder Instructions      Home Living Family/patient expects to be discharged to:: Private residence Living Arrangements: Spouse/significant other Available Help at Discharge: Family;Available 24 hours/day Type  of Home: Mobile home Home Access: Stairs to enter [employer] of Steps: 4 Entrance Stairs-Rails: Right;Left;Can reach both Home Layout: One level     Bathroom Shower/Tub: Teacher, early years/pre: Standard     Home Equipment: Environmental consultant - 2 wheels          Prior Functioning/Environment Level of Independence: Independent                 OT Problem List: Decreased knowledge of precautions;Obesity;Pain      OT Treatment/Interventions:      OT Goals(Current goals can be found in the care plan section) Acute Rehab OT Goals Patient Stated Goal: decrease pain OT Goal Formulation: With patient Time For Goal  Achievement: 02/08/17 Potential to Achieve Goals: Good  OT Frequency:     Barriers to D/C:            Co-evaluation              AM-PAC PT "6 Clicks" Daily Activity     Outcome Measure Help from another person eating meals?: None Help from another person taking care of personal grooming?: None Help from another person toileting, which includes using toliet, bedpan, or urinal?: None Help from another person bathing (including washing, rinsing, drying)?: None Help from another person to put on and taking off regular upper body clothing?: None Help from another person to put on and taking off regular lower body clothing?: None 6 Click Score: 24   End of Session Nurse Communication: Mobility status  Activity Tolerance: Patient tolerated treatment well Patient left: in bed;with call bell/phone within reach;with family/visitor present  OT Visit Diagnosis: Pain Pain - Right/Left: Right Pain - part of body: Leg (back)                Time: 3244-0102 OT Time Calculation (min): 16 min Charges:  OT General Charges $OT Visit: 1 Procedure OT Evaluation $OT Eval Low Complexity: 1 Procedure G-Codes:    Hulda Humphrey OTR/L [place] 01/25/2017, 1:27 PM

## 2017-01-25 NOTE — Progress Notes (Signed)
    Subjective: 1 Day Post-Op Procedure(s) (LRB): LUMBAR SPINAL CORD STIMULATOR INSERTION (N/A) Patient reports pain as 7 on 0-10 scale. When she is getting up. 0-10 laying in bed  Denies CP or SOB.  Voiding without difficulty. Positive flatus. Objective: Vital signs in last 24 hours: Temp:  [97.3 F (36.3 C)-98.7 F (37.1 C)] 98 F (36.7 C) (08/03 0450) Pulse Rate:  [72-92] 84 (08/03 0450) Resp:  [12-20] 18 (08/03 0450) BP: (116-136)/(60-80) 135/76 (08/03 0450) SpO2:  [92 %-99 %] 94 % (08/03 0450)  Intake/Output from previous day: 08/02 0701 - 08/03 0700 In: 1000 [I.V.:1000] Out: 30 [Blood:30] Intake/Output this shift: No intake/output data recorded.  Labs: No results for input(s): HGB in the last 72 hours. No results for input(s): WBC, RBC, HCT, PLT in the last 72 hours. No results for input(s): NA, K, CL, CO2, BUN, CREATININE, GLUCOSE, CALCIUM in the last 72 hours. No results for input(s): LABPT, INR in the last 72 hours.  Physical Exam: Neurologically intact ABD soft Sensation intact distally Dorsiflexion/Plantar flexion intact Incision: scant drainage Compartment soft  Assessment/Plan: 1 Day Post-Op Procedure(s) (LRB): LUMBAR SPINAL CORD STIMULATOR INSERTION (N/A) Advance diet Up with therapy  Pt may DC after she works with PT and meets with [employer] rep  [place], Darla Lesches for Dr. Melina Schools [facility] [phone] 01/25/2017, 7:27 AM    Patient ID: Kathleen Good, female   DOB: Jun 24, 1961, 56 y.o.   MRN: [id number]

## 2017-01-25 NOTE — Care Management Note (Signed)
Case Management Note  Patient Details  Name: Kathleen Good MRN: [id number] Date of Birth: [birth year]  Subjective/Objective:   Pt s/p spinal cord stimulator. She is from home with her spouse.                  Action/Plan: No f/u and no DME needs per PT. Pt has insurance and PCP. No further needs per CM.   Expected Discharge Date:  01/25/17               Expected Discharge Plan:  Home/Self Care  In-House Referral:     Discharge planning Services     Post Acute Care Choice:    Choice offered to:     DME Arranged:    DME Agency:     HH Arranged:    HH Agency:     Status of Service:  Completed, signed off  If discussed at [employer] of Stay Meetings, dates discussed:    Additional Comments:  Pollie Friar, RN 01/25/2017, 11:57 AM
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
## 2017-02-08 DIAGNOSIS — M5136 Other intervertebral disc degeneration, lumbar region: Secondary | ICD-10-CM | POA: Diagnosis not present

## 2017-02-08 DIAGNOSIS — G8929 Other chronic pain: Secondary | ICD-10-CM | POA: Diagnosis not present

## 2017-02-15 ENCOUNTER — Other Ambulatory Visit: Payer: 59

## 2017-02-15 DIAGNOSIS — M62838 Other muscle spasm: Secondary | ICD-10-CM | POA: Diagnosis not present

## 2017-02-19 ENCOUNTER — Ambulatory Visit: Payer: 59 | Admitting: Family Medicine

## 2017-03-20 ENCOUNTER — Encounter: Payer: Self-pay | Admitting: Family Medicine

## 2017-03-21 MED ORDER — METOPROLOL SUCCINATE ER 100 MG PO TB24: 100.0000 mg | ORAL_TABLET | Freq: Two times a day (BID) | ORAL | 1 refills | Status: DC

## 2017-03-25 DIAGNOSIS — M546 Pain in thoracic spine: Secondary | ICD-10-CM | POA: Diagnosis not present

## 2017-03-25 DIAGNOSIS — M62838 Other muscle spasm: Secondary | ICD-10-CM | POA: Diagnosis not present

## 2017-04-02 ENCOUNTER — Other Ambulatory Visit: Payer: Self-pay | Admitting: Family Medicine

## 2017-04-03 ENCOUNTER — Other Ambulatory Visit: Payer: Self-pay | Admitting: Family Medicine

## 2017-04-03 ENCOUNTER — Other Ambulatory Visit: Payer: 59

## 2017-04-03 DIAGNOSIS — E7849 Other hyperlipidemia: Secondary | ICD-10-CM

## 2017-04-03 DIAGNOSIS — E1165 Type 2 diabetes mellitus with hyperglycemia: Secondary | ICD-10-CM

## 2017-04-03 DIAGNOSIS — Z23 Encounter for immunization: Secondary | ICD-10-CM | POA: Diagnosis not present

## 2017-04-10 ENCOUNTER — Ambulatory Visit: Payer: 59 | Admitting: Family Medicine

## 2017-04-16 ENCOUNTER — Encounter: Payer: Self-pay | Admitting: Family Medicine

## 2017-04-17 DIAGNOSIS — M23322 Other meniscus derangements, posterior horn of medial meniscus, left knee: Secondary | ICD-10-CM | POA: Diagnosis not present

## 2017-04-17 DIAGNOSIS — M1712 Unilateral primary osteoarthritis, left knee: Secondary | ICD-10-CM | POA: Diagnosis not present

## 2017-04-17 DIAGNOSIS — M7052 Other bursitis of knee, left knee: Secondary | ICD-10-CM | POA: Diagnosis not present

## 2017-05-19 ENCOUNTER — Other Ambulatory Visit: Payer: Self-pay | Admitting: Family Medicine

## 2017-05-22 DIAGNOSIS — M1712 Unilateral primary osteoarthritis, left knee: Secondary | ICD-10-CM | POA: Diagnosis not present

## 2017-05-29 DIAGNOSIS — M1712 Unilateral primary osteoarthritis, left knee: Secondary | ICD-10-CM | POA: Diagnosis not present

## 2017-06-05 DIAGNOSIS — M1712 Unilateral primary osteoarthritis, left knee: Secondary | ICD-10-CM | POA: Diagnosis not present

## 2017-06-12 DIAGNOSIS — M961 Postlaminectomy syndrome, not elsewhere classified: Secondary | ICD-10-CM | POA: Diagnosis not present

## 2017-06-12 DIAGNOSIS — M542 Cervicalgia: Secondary | ICD-10-CM | POA: Diagnosis not present

## 2017-06-12 DIAGNOSIS — M5136 Other intervertebral disc degeneration, lumbar region: Secondary | ICD-10-CM | POA: Diagnosis not present

## 2017-06-16 ENCOUNTER — Other Ambulatory Visit: Payer: Self-pay | Admitting: Family Medicine

## 2017-07-24 ENCOUNTER — Other Ambulatory Visit: Payer: Self-pay | Admitting: Family Medicine

## 2017-08-22 ENCOUNTER — Other Ambulatory Visit: Payer: Self-pay | Admitting: Family Medicine

## 2017-10-30 ENCOUNTER — Encounter: Payer: Self-pay | Admitting: Family Medicine

## 2017-10-31 MED ORDER — OMEPRAZOLE 20 MG PO CPDR
20.0000 mg | DELAYED_RELEASE_CAPSULE | Freq: Two times a day (BID) | ORAL | 0 refills | Status: DC
Start: 2017-10-31 — End: 2017-11-20

## 2017-10-31 MED ORDER — DILTIAZEM HCL ER COATED BEADS 180 MG PO CP24: 180.0000 mg | ORAL_CAPSULE | Freq: Every day | ORAL | 0 refills | Status: DC

## 2017-11-14 ENCOUNTER — Other Ambulatory Visit (INDEPENDENT_AMBULATORY_CARE_PROVIDER_SITE_OTHER): Payer: 59

## 2017-11-14 DIAGNOSIS — E1165 Type 2 diabetes mellitus with hyperglycemia: Secondary | ICD-10-CM

## 2017-11-14 DIAGNOSIS — E7849 Other hyperlipidemia: Secondary | ICD-10-CM | POA: Diagnosis not present

## 2017-11-14 LAB — COMPREHENSIVE METABOLIC PANEL
ALT: 22 U/L (ref 0–35)
AST: 18 U/L (ref 0–37)
Albumin: 3.7 g/dL (ref 3.5–5.2)
Alkaline Phosphatase: 81 U/L (ref 39–117)
BUN: 9 mg/dL (ref 6–23)
CO2: 25 mEq/L (ref 19–32)
Calcium: 8.8 mg/dL (ref 8.4–10.5)
Chloride: 105 mEq/L (ref 96–112)
Creatinine, Ser: 0.84 mg/dL (ref 0.40–1.20)
GFR: 74.85 mL/min (ref >60.00)
Glucose, Bld: 134 mg/dL — ABNORMAL HIGH (ref 70–99)
Potassium: 3.9 mEq/L (ref 3.5–5.1)
Sodium: 138 mEq/L (ref 135–145)
Total Bilirubin: 0.4 mg/dL (ref 0.2–1.2)
Total Protein: 7.3 g/dL (ref 6.0–8.3)

## 2017-11-14 LAB — LIPID PANEL
Cholesterol: 246 mg/dL — ABNORMAL HIGH (ref 0–200)
HDL: 39.6 mg/dL (ref >39.00)
LDL Cholesterol: 175 mg/dL — ABNORMAL HIGH (ref 0–99)
NonHDL: 206.49
Total CHOL/HDL Ratio: 6
Triglycerides: 156 mg/dL — ABNORMAL HIGH (ref 0.0–149.0)
VLDL: 31.2 mg/dL (ref 0.0–40.0)

## 2017-11-14 LAB — HEMOGLOBIN A1C: Hgb A1c MFr Bld: 7.6 % — ABNORMAL HIGH (ref 4.6–6.5)

## 2017-11-20 ENCOUNTER — Encounter: Payer: Self-pay | Admitting: Family Medicine

## 2017-11-20 ENCOUNTER — Other Ambulatory Visit (HOSPITAL_COMMUNITY)
Admission: RE | Admit: 2017-11-20 | Discharge: 2017-11-20 | Disposition: A | Discharge status: Home / Self Care | Payer: 59 | Source: Ambulatory Visit | Attending: Family Medicine | Admitting: Family Medicine

## 2017-11-20 ENCOUNTER — Ambulatory Visit (INDEPENDENT_AMBULATORY_CARE_PROVIDER_SITE_OTHER): Payer: 59 | Admitting: Family Medicine

## 2017-11-20 VITALS — BP 120/84 | HR 79 | Temp 97.9°F | Ht 64.5 in | Wt 301.0 lb

## 2017-11-20 DIAGNOSIS — G4733 Obstructive sleep apnea (adult) (pediatric): Secondary | ICD-10-CM | POA: Insufficient documentation

## 2017-11-20 DIAGNOSIS — Z0001 Encounter for general adult medical examination with abnormal findings: Secondary | ICD-10-CM | POA: Diagnosis not present

## 2017-11-20 DIAGNOSIS — K219 Gastro-esophageal reflux disease without esophagitis: Secondary | ICD-10-CM | POA: Insufficient documentation

## 2017-11-20 DIAGNOSIS — R21 Rash and other nonspecific skin eruption: Secondary | ICD-10-CM | POA: Diagnosis not present

## 2017-11-20 DIAGNOSIS — M797 Fibromyalgia: Secondary | ICD-10-CM

## 2017-11-20 DIAGNOSIS — Z6841 Body Mass Index (BMI) 40.0 and over, adult: Secondary | ICD-10-CM | POA: Insufficient documentation

## 2017-11-20 DIAGNOSIS — E7849 Other hyperlipidemia: Secondary | ICD-10-CM

## 2017-11-20 DIAGNOSIS — Z7982 Long term (current) use of aspirin: Secondary | ICD-10-CM | POA: Insufficient documentation

## 2017-11-20 DIAGNOSIS — Z87891 Personal history of nicotine dependence: Secondary | ICD-10-CM | POA: Diagnosis not present

## 2017-11-20 DIAGNOSIS — I10 Essential (primary) hypertension: Secondary | ICD-10-CM

## 2017-11-20 DIAGNOSIS — Z9889 Other specified postprocedural states: Secondary | ICD-10-CM | POA: Diagnosis not present

## 2017-11-20 DIAGNOSIS — Z0000 Encounter for general adult medical examination without abnormal findings: Secondary | ICD-10-CM

## 2017-11-20 DIAGNOSIS — F331 Major depressive disorder, recurrent, moderate: Secondary | ICD-10-CM | POA: Insufficient documentation

## 2017-11-20 DIAGNOSIS — Z9689 Presence of other specified functional implants: Secondary | ICD-10-CM

## 2017-11-20 DIAGNOSIS — E1165 Type 2 diabetes mellitus with hyperglycemia: Secondary | ICD-10-CM | POA: Insufficient documentation

## 2017-11-20 DIAGNOSIS — Z79899 Other long term (current) drug therapy: Secondary | ICD-10-CM | POA: Insufficient documentation

## 2017-11-20 MED ORDER — DILTIAZEM HCL ER COATED BEADS 180 MG PO CP24: 180.0000 mg | ORAL_CAPSULE | Freq: Every day | ORAL | 3 refills | Status: DC

## 2017-11-20 MED ORDER — HYDROCHLOROTHIAZIDE 25 MG PO TABS: 25.0000 mg | ORAL_TABLET | Freq: Every day | ORAL | 6 refills | Status: DC | PRN

## 2017-11-20 MED ORDER — BUPROPION HCL ER (SR) 150 MG PO TB12: 150.0000 mg | ORAL_TABLET | Freq: Two times a day (BID) | ORAL | 11 refills | Status: DC

## 2017-11-20 MED ORDER — ROSUVASTATIN CALCIUM 40 MG PO TABS: 40.0000 mg | ORAL_TABLET | Freq: Every day | ORAL | 3 refills | Status: DC

## 2017-11-20 MED ORDER — SAXAGLIPTIN HCL 5 MG PO TABS: 5.0000 mg | ORAL_TABLET | Freq: Every day | ORAL | 3 refills | Status: DC

## 2017-11-20 MED ORDER — OMEPRAZOLE 40 MG PO CPDR: 40.0000 mg | DELAYED_RELEASE_CAPSULE | Freq: Two times a day (BID) | ORAL | 3 refills | Status: DC

## 2017-11-20 MED ORDER — GLIMEPIRIDE 4 MG PO TABS: 4.0000 mg | ORAL_TABLET | Freq: Every day | ORAL | 11 refills | Status: DC

## 2017-11-20 MED ORDER — NORTRIPTYLINE HCL 10 MG PO CAPS
10.0000 mg | ORAL_CAPSULE | Freq: Every day | ORAL | 3 refills | Status: DC
Start: 2017-11-20 — End: 2018-07-15

## 2017-11-20 MED ORDER — METOPROLOL SUCCINATE ER 100 MG PO TB24: 100.0000 mg | ORAL_TABLET | Freq: Two times a day (BID) | ORAL | 3 refills | Status: DC

## 2017-11-20 NOTE — Assessment & Plan Note (Addendum)
CPAP never started.

## 2017-11-20 NOTE — Assessment & Plan Note (Signed)
Deteriorated with PHQ9 = 14. No SI/HI. continue wellbutrin 150mg  bid, add nortriptyline 10mg  nightly. H/o intolerance to cymbalta (leg swelling) and amitritpyline (oversedation).

## 2017-11-20 NOTE — Patient Instructions (Addendum)
You are due for colonoscopy.  You are due for mammogram.  Continue current medicines. Change omeprazole 66m twice daily to 451monce daily.  Trial nortriptyline 1076mightly for mood, and may help back pain.  Switch rosuvastatin to night time.  We will refer you to [facility].   Health Maintenance, Female Adopting a healthy lifestyle and getting preventive care can go a long way to promote health and wellness. Talk with your health care provider about what schedule of regular examinations is right for you. This is a good chance for you to check in with your provider about disease prevention and staying healthy. In between checkups, there are plenty of things you can do on your own. Experts have done a lot of research about which lifestyle changes and preventive measures are most likely to keep you healthy. Ask your health care provider for more information. Weight and diet Eat a healthy diet  Be sure to include plenty of vegetables, fruits, low-fat dairy products, and lean protein.  Do not eat a lot of foods high in solid fats, added sugars, or salt.  Get regular exercise. This is one of the most important things you can do for your health. ? Most adults should exercise for at least 150 minutes each week. The exercise should increase your heart rate and make you sweat (moderate-intensity exercise). ? Most adults should also do strengthening exercises at least twice a week. This is in addition to the moderate-intensity exercise.  Maintain a healthy weight  Body mass index (BMI) is a measurement that can be used to identify possible weight problems. It estimates body fat based on height and weight. Your health care provider can help determine your BMI and help you achieve or maintain a healthy weight.  For females 20 20ars of age and older: ? A BMI below 18.5 is considered underweight. ? A BMI of 18.5 to 24.9 is normal. ? A BMI of 25 to 29.9 is considered overweight. ? A BMI of 30 and  above is considered obese.  Watch levels of cholesterol and blood lipids  You should start having your blood tested for lipids and cholesterol at 20 49ars of age, then have this test every 5 years.  You may need to have your cholesterol levels checked more often if: ? Your lipid or cholesterol levels are high. ? You are older than 50 27ars of age. ? You are at high risk for heart disease.  Cancer screening Lung Cancer  Lung cancer screening is recommended for adults 55-75 14ars old who are at high risk for lung cancer because of a history of smoking.  A yearly low-dose CT scan of the lungs is recommended for people who: ? Currently smoke. ? Have quit within the past 15 years. ? Have at least a 30-pack-year history of smoking. A pack year is smoking an average of one pack of cigarettes a day for 1 year.  Yearly screening should continue until it has been 15 years since you quit.  Yearly screening should stop if you develop a health problem that would prevent you from having lung cancer treatment.  Breast Cancer  Practice breast self-awareness. This means understanding how your breasts normally appear and feel.  It also means doing regular breast self-exams. Let your health care provider know about any changes, no matter how small.  If you are in your 20s or 30s, you should have a clinical breast exam (CBE) by a health care provider every 1-3 years as part  of a regular health exam.  If you are 30 or older, have a CBE every year. Also consider having a breast X-ray (mammogram) every year.  If you have a family history of breast cancer, talk to your health care provider about genetic screening.  If you are at high risk for breast cancer, talk to your health care provider about having an MRI and a mammogram every year.  Breast cancer gene (BRCA) assessment is recommended for women who have family members with BRCA-related cancers. BRCA-related cancers  include: ? Breast. ? Ovarian. ? Tubal. ? Peritoneal cancers.  Results of the assessment will determine the need for genetic counseling and BRCA1 and BRCA2 testing.  Cervical Cancer Your health care provider may recommend that you be screened regularly for cancer of the pelvic organs (ovaries, uterus, and vagina). This screening involves a pelvic examination, including checking for microscopic changes to the surface of your cervix (Pap test). You may be encouraged to have this screening done every 3 years, beginning at age 70.  For women ages 17-65, health care providers may recommend pelvic exams and Pap testing every 3 years, or they may recommend the Pap and pelvic exam, combined with testing for human papilloma virus (HPV), every 5 years. Some types of HPV increase your risk of cervical cancer. Testing for HPV may also be done on women of any age with unclear Pap test results.  Other health care providers may not recommend any screening for nonpregnant women who are considered low risk for pelvic cancer and who do not have symptoms. Ask your health care provider if a screening pelvic exam is right for you.  If you have had past treatment for cervical cancer or a condition that could lead to cancer, you need Pap tests and screening for cancer for at least 20 years after your treatment. If Pap tests have been discontinued, your risk factors (such as having a new sexual partner) need to be reassessed to determine if screening should resume. Some women have medical problems that increase the chance of getting cervical cancer. In these cases, your health care provider may recommend more frequent screening and Pap tests.  Colorectal Cancer  This type of cancer can be detected and often prevented.  Routine colorectal cancer screening usually begins at 55 years of age and continues through 55 years of age.  Your health care provider may recommend screening at an earlier age if you have risk factors  for colon cancer.  Your health care provider may also recommend using home test kits to check for hidden blood in the stool.  A small camera at the end of a tube can be used to examine your colon directly (sigmoidoscopy or colonoscopy). This is done to check for the earliest forms of colorectal cancer.  Routine screening usually begins at age 85.  Direct examination of the colon should be repeated every 5-10 years through 55 years of age. However, you may need to be screened more often if early forms of precancerous polyps or small growths are found.  Skin Cancer  Check your skin from head to toe regularly.  Tell your health care provider about any new moles or changes in moles, especially if there is a change in a mole's shape or color.  Also tell your health care provider if you have a mole that is larger than the size of a pencil eraser.  Always use sunscreen. Apply sunscreen liberally and repeatedly throughout the day.  Protect yourself by wearing long  sleeves, pants, a wide-brimmed hat, and sunglasses whenever you are outside.  Heart disease, diabetes, and high blood pressure  High blood pressure causes heart disease and increases the risk of stroke. High blood pressure is more likely to develop in: ? People who have blood pressure in the high end of the normal range (130-139/85-89 mm Hg). ? People who are overweight or obese. ? People who are African American.  If you are 93-79 years of age, have your blood pressure checked every 3-5 years. If you are 85 years of age or older, have your blood pressure checked every year. You should have your blood pressure measured twice-once when you are at a hospital or clinic, and once when you are not at a hospital or clinic. Record the average of the two measurements. To check your blood pressure when you are not at a hospital or clinic, you can use: ? An automated blood pressure machine at a pharmacy. ? A home blood pressure monitor.  If  you are between 74 years and 26 years old, ask your health care provider if you should take aspirin to prevent strokes.  Have regular diabetes screenings. This involves taking a blood sample to check your fasting blood sugar level. ? If you are at a normal weight and have a low risk for diabetes, have this test once every three years after 55 years of age. ? If you are overweight and have a high risk for diabetes, consider being tested at a younger age or more often. Preventing infection Hepatitis B  If you have a higher risk for hepatitis B, you should be screened for this virus. You are considered at high risk for hepatitis B if: ? You were born in a country where hepatitis B is common. Ask your health care provider which countries are considered high risk. ? Your parents were born in a high-risk country, and you have not been immunized against hepatitis B (hepatitis B vaccine). ? You have HIV or AIDS. ? You use needles to inject street drugs. ? You live with someone who has hepatitis B. ? You have had sex with someone who has hepatitis B. ? You get hemodialysis treatment. ? You take certain medicines for conditions, including cancer, organ transplantation, and autoimmune conditions.  Hepatitis C  Blood testing is recommended for: ? Everyone born from [birth year] through 1963. ? Anyone with known risk factors for hepatitis C.  Sexually transmitted infections (STIs)  You should be screened for sexually transmitted infections (STIs) including gonorrhea and chlamydia if: ? You are sexually active and are younger than 55 years of age. ? You are older than 55 years of age and your health care provider tells you that you are at risk for this type of infection. ? Your sexual activity has changed since you were last screened and you are at an increased risk for chlamydia or gonorrhea. Ask your health care provider if you are at risk.  If you do not have HIV, but are at risk, it may be recommended  that you take a prescription medicine daily to prevent HIV infection. This is called pre-exposure prophylaxis (PrEP). You are considered at risk if: ? You are sexually active and do not regularly use condoms or know the HIV status of your partner(s). ? You take drugs by injection. ? You are sexually active with a partner who has HIV.  Talk with your health care provider about whether you are at high risk of being infected with HIV. If  you choose to begin PrEP, you should first be tested for HIV. You should then be tested every 3 months for as long as you are taking PrEP. Pregnancy  If you are premenopausal and you may become pregnant, ask your health care provider about preconception counseling.  If you may become pregnant, take 400 to 800 micrograms (mcg) of folic acid every day.  If you want to prevent pregnancy, talk to your health care provider about birth control (contraception). Osteoporosis and menopause  Osteoporosis is a disease in which the bones lose minerals and strength with aging. This can result in serious bone fractures. Your risk for osteoporosis can be identified using a bone density scan.  If you are 51 years of age or older, or if you are at risk for osteoporosis and fractures, ask your health care provider if you should be screened.  Ask your health care provider whether you should take a calcium or vitamin D supplement to lower your risk for osteoporosis.  Menopause may have certain physical symptoms and risks.  Hormone replacement therapy may reduce some of these symptoms and risks. Talk to your health care provider about whether hormone replacement therapy is right for you. Follow these instructions at home:  Schedule regular health, dental, and eye exams.  Stay current with your immunizations.  Do not use any tobacco products including cigarettes, chewing tobacco, or electronic cigarettes.  If you are pregnant, do not drink alcohol.  If you are  breastfeeding, limit how much and how often you drink alcohol.  Limit alcohol intake to no more than 1 drink per day for nonpregnant women. One drink equals 12 ounces of beer, 5 ounces of wine, or 1 ounces of hard liquor.  Do not use street drugs.  Do not share needles.  Ask your health care provider for help if you need support or information about quitting drugs.  Tell your health care provider if you often feel depressed.  Tell your health care provider if you have ever been abused or do not feel safe at home. This information is not intended to replace advice given to you by your health care provider. Make sure you discuss any questions you have with your health care provider. Document Released: 12/25/2010 Document Revised: 11/17/2015 Document Reviewed: 03/15/2015 Elsevier Interactive Patient Education  [employer].

## 2017-11-20 NOTE — Assessment & Plan Note (Signed)
Has been taking omeprazole 20mg  bid. Will change to 40mg  once daily.

## 2017-11-20 NOTE — Assessment & Plan Note (Signed)
Psoriatic rash - derm referral placed.

## 2017-11-20 NOTE — Assessment & Plan Note (Addendum)
Preventative protocols reviewed and updated unless pt declined. Discussed healthy diet and lifestyle.  Pap performed today. Overdue for colonoscopy, mammogram. Encouraged she schedule these appointments.

## 2017-11-20 NOTE — Assessment & Plan Note (Addendum)
LDL remains elevated despite hight dose crestor. Change rosuvastatin to nightly.  The 10-year ASCVD risk score Mikey Bussing DC Brooke Bonito., et al., 2013) is: 7.1%   Values used to calculate the score:     Age: 57 years     Sex: Female     Is Non-Hispanic African American: No     Diabetic: Yes     Tobacco smoker: No     Systolic Blood Pressure: 407 mmHg     Is BP treated: Yes     HDL Cholesterol: 39.6 mg/dL     Total Cholesterol: 246 mg/dL

## 2017-11-20 NOTE — Assessment & Plan Note (Signed)
Chronic, stable. Continue current regimen. 

## 2017-11-20 NOTE — Progress Notes (Signed)
BP 120/84 (BP Location: Left Arm, Patient Position: Sitting, Cuff Size: Large)   Pulse 79   Temp 97.9 F (36.6 C) (Oral)   Ht 5' 4.5" (1.638 m)   Wt (!) 301 lb (136.5 kg)   LMP 05/21/2014   SpO2 96%   BMI 50.87 kg/m    CC: CPE Subjective:    Patient ID: Kathleen Good, female    DOB: 1961-03-15, 57 y.o.   MRN: [id number]  HPI: Kathleen Good is a 57 y.o. female presenting on 11/20/2017 for Annual Exam   She had permanent implantation of dorsal column stimulator by Dr Rolena Infante for chronic lower back pain 01/2017, failed multiple lumbar steroid injections and RF ablations. This has helped some but has persistent chronic back pain.   Not regular with breakfast or lunch due to decreased appetite - so she doesn't regularly take amaryl. She does eat dinner nightly.   Worsening psoriasis L elbow. Requests derm referral.   Preventative: COLONOSCOPY Date: 12/11/2011 hyperplastic polyp, rpt 5 yrs given fmhx Gatha Mayer). Trouble with preps. Has letter at home.  Well woman exam last 08/2014 with normal pap, rpt today.  LMP 04/2014. Significant hot flashes and night sweats for last 10 yrs.  Last mammogram 04/2012 normal. Due for rpt. Does breast exams at home.  Flu - declines  Pneumovax 2015 Tdap - 11/2010 Seat belt use discussed Sunscreen use discussed. No suspicious moles.  Ex smoker - quit 2014, 15 PY history.  Alcohol - occasional Dentist - last seen 1 yr ago Eye exam - 4 months past due  Lives with husband and cats and dogs Occupation: housewife Activity: walk dogs but limited by back pain Diet: good water, some fruits/vegetables  Relevant past medical, surgical, family and social history reviewed and updated as indicated. Interim medical history since our last visit reviewed. Allergies and medications reviewed and updated. Outpatient Medications Prior to Visit  Medication Sig Dispense Refill  . aspirin EC 81 MG tablet Take 81 mg by mouth daily with supper.    . Blood  Glucose Monitoring Suppl KIT Use to check sugar once daily 2 hours after a meal. Dx: E11.65 **Dispense per insurance guidelines** 1 each 0  . clobetasol cream (TEMOVATE) 6.22 % Apply 1 application topically 2 (two) times daily as needed. 45 g 1  . cyclobenzaprine (FLEXERIL) 10 MG tablet Take 10 mg by mouth 2 (two) times daily as needed for muscle spasms (back pain).     Marland Kitchen diphenhydramine-acetaminophen (TYLENOL PM) 25-500 MG TABS tablet Take 2 tablets by mouth at bedtime as needed (pain).    Marland Kitchen glucose blood (ONE TOUCH ULTRA TEST) test strip Use to check sugar twice daily. Dispense based on insurance preference. Dx:E11.65 180 each 3  . ibuprofen (ADVIL,MOTRIN) 200 MG tablet Take 400 mg by mouth every 6 (six) hours as needed for headache (pain).    . Melatonin 5 MG TABS Take 1 tablet by mouth at bedtime as needed.    Glory Rosebush DELICA LANCETS 29N MISC Use to check sugar twice daily as needed. Dispense based on insurance preference. Dx: E11.65 180 each 3  . propranolol (INDERAL) 20 MG tablet Take 1 tablet (20 mg total) by mouth 3 (three) times daily as needed. (Patient taking differently: Take 20 mg by mouth 3 (three) times daily as needed (heart palpitations). ) 90 tablet 3  . buPROPion (WELLBUTRIN SR) 150 MG 12 hr tablet TAKE 1 TABLET TWICE A DAY 60 tablet 0  . diltiazem (CARTIA XT)  180 MG 24 hr capsule Take 1 capsule (180 mg total) by mouth daily. 30 capsule 0  . glimepiride (AMARYL) 4 MG tablet Take 1 tablet (4 mg total) by mouth daily before breakfast. 30 tablet 11  . hydrochlorothiazide (HYDRODIURIL) 25 MG tablet Take 1 tablet (25 mg total) by mouth daily as needed (swelling). 30 tablet 1  . metoprolol succinate (TOPROL-XL) 100 MG 24 hr tablet TAKE ONE TABLET BY MOUTH TWICE A DAY WITH OR IMMEDIATELY FOLLOWING A MEAL 60 tablet 5  . omeprazole (PRILOSEC) 20 MG capsule Take 1 capsule (20 mg total) by mouth 2 (two) times daily. 60 capsule 0  . rosuvastatin (CRESTOR) 40 MG tablet Take 1 tablet (40 mg  total) by mouth daily. 90 tablet 3  . saxagliptin HCl (ONGLYZA) 5 MG TABS tablet Take 1 tablet (5 mg total) by mouth daily. 90 tablet 3  . HYDROmorphone (DILAUDID) 2 MG tablet Take 1 tablet (2 mg total) by mouth every 4 (four) hours as needed for severe pain. 42 tablet 0  . ondansetron (ZOFRAN ODT) 4 MG disintegrating tablet Take 1 tablet (4 mg total) by mouth every 8 (eight) hours as needed for nausea or vomiting. 20 tablet 0   No facility-administered medications prior to visit.      Per HPI unless specifically indicated in ROS section below Review of Systems  Constitutional: Negative for activity change, appetite change, chills, fatigue, fever and unexpected weight change.  HENT: Positive for congestion (URI last month). Negative for hearing loss.   Eyes: Negative for visual disturbance.  Respiratory: Positive for shortness of breath (with exertion). Negative for cough, chest tightness and wheezing.   Cardiovascular: Positive for leg swelling (chronic). Negative for chest pain and palpitations.  Gastrointestinal: Positive for nausea and vomiting (episode last week). Negative for abdominal distention, abdominal pain, blood in stool, constipation and diarrhea.  Genitourinary: Negative for difficulty urinating and hematuria.  Musculoskeletal: Negative for arthralgias, myalgias and neck pain.  Skin: Negative for rash.  Neurological: Positive for dizziness (intermittent). Negative for seizures, syncope and headaches.  Hematological: Negative for adenopathy. Bruises/bleeds easily.  Psychiatric/Behavioral: Positive for dysphoric mood. The patient is not nervous/anxious.        Objective:    BP 120/84 (BP Location: Left Arm, Patient Position: Sitting, Cuff Size: Large)   Pulse 79   Temp 97.9 F (36.6 C) (Oral)   Ht 5' 4.5" (1.638 m)   Wt (!) 301 lb (136.5 kg)   LMP 05/21/2014   SpO2 96%   BMI 50.87 kg/m   Wt Readings from Last 3 Encounters:  11/20/17 (!) 301 lb (136.5 kg)  01/21/17  297 lb 12.8 oz (135.1 kg)  01/04/17 (!) 301 lb (136.5 kg)    Physical Exam  Constitutional: She is oriented to person, place, and time. She appears well-developed and well-nourished. No distress.  HENT:  Head: Normocephalic and atraumatic.  Right Ear: Hearing, tympanic membrane, external ear and ear canal normal.  Left Ear: Hearing, tympanic membrane, external ear and ear canal normal.  Nose: Nose normal.  Mouth/Throat: Uvula is midline, oropharynx is clear and moist and mucous membranes are normal. No oropharyngeal exudate, posterior oropharyngeal edema or posterior oropharyngeal erythema.  Eyes: Pupils are equal, round, and reactive to light. Conjunctivae and EOM are normal. No scleral icterus.  Neck: Normal range of motion. Neck supple. No thyromegaly present.  Cardiovascular: Normal rate, regular rhythm, normal heart sounds and intact distal pulses.  No murmur heard. Pulses:      Radial pulses  are 2+ on the right side, and 2+ on the left side.  Pulmonary/Chest: Effort normal and breath sounds normal. No respiratory distress. She has no wheezes. She has no rales.  Abdominal: Soft. Bowel sounds are normal. She exhibits no distension and no mass. There is no tenderness. There is no rebound and no guarding.  Genitourinary: Vagina normal and uterus normal. Pelvic exam was performed with patient supine. There is rash on the right labia. There is no tenderness, lesion or injury on the right labia. There is rash on the left labia. There is no tenderness, lesion or injury on the left labia. Cervix exhibits no motion tenderness and no discharge. Right adnexum displays no mass, no tenderness and no fullness. Left adnexum displays no mass, no tenderness and no fullness. No erythema in the vagina. No vaginal discharge found.  Genitourinary Comments: Using large speculum, pap performed on cervix  Musculoskeletal: Normal range of motion. She exhibits no edema.  Lymphadenopathy:    She has no cervical  adenopathy.  Neurological: She is alert and oriented to person, place, and time.  CN grossly intact, station and gait intact  Skin: Skin is warm and dry. No rash noted.  Psychiatric: She has a normal mood and affect. Her behavior is normal. Judgment and thought content normal.  Nursing note and vitals reviewed.  Results for orders placed or performed in visit on 11/14/17  Hemoglobin A1c  Result Value Ref Range   Hgb A1c MFr Bld 7.6 (H) 4.6 - 6.5 %  Comprehensive metabolic panel  Result Value Ref Range   Sodium 138 135 - 145 mEq/L   Potassium 3.9 3.5 - 5.1 mEq/L   Chloride 105 96 - 112 mEq/L   CO2 25 19 - 32 mEq/L   Glucose, Bld 134 (H) 70 - 99 mg/dL   BUN 9 6 - 23 mg/dL   Creatinine, Ser 0.84 0.40 - 1.20 mg/dL   Total Bilirubin 0.4 0.2 - 1.2 mg/dL   Alkaline Phosphatase 81 39 - 117 U/L   AST 18 0 - 37 U/L   ALT 22 0 - 35 U/L   Total Protein 7.3 6.0 - 8.3 g/dL   Albumin 3.7 3.5 - 5.2 g/dL   Calcium 8.8 8.4 - 10.5 mg/dL   GFR 74.85 >60.00 mL/min  Lipid panel  Result Value Ref Range   Cholesterol 246 (H) 0 - 200 mg/dL   Triglycerides 156.0 (H) 0.0 - 149.0 mg/dL   HDL 39.60 >39.00 mg/dL   VLDL 31.2 0.0 - 40.0 mg/dL   LDL Cholesterol 175 (H) 0 - 99 mg/dL   Total CHOL/HDL Ratio 6    NonHDL 206.49    Depression screen [facility] 2/9 11/20/2017 11/20/2017 01/04/2017 11/24/2015 01/06/2015  Decreased Interest 1 0 0 0 3  Down, Depressed, Hopeless 3 - 0 0 3  PHQ - 2 Score 4 0 0 0 6  Altered sleeping 3 - - - 3  Tired, decreased energy 3 - - - 3  Change in appetite 3 - - - 0  Feeling bad or failure about yourself  1 - - - 2  Trouble concentrating 0 - - - 3  Moving slowly or fidgety/restless 0 - - - 0  Suicidal thoughts 0 - - - 0  PHQ-9 Score 14 - - - 17  Difficult doing work/chores - - - - Extremely dIfficult   GAD 7 : Generalized Anxiety Score 11/20/2017  Nervous, Anxious, on Edge 1  Control/stop worrying 1  Worry too much - different  things 1  Trouble relaxing 0  Restless 0  Easily  annoyed or irritable 3  Afraid - awful might happen 0  Total GAD 7 Score 6        Assessment & Plan:   Problem List Items Addressed This Visit    Diabetes type 2, uncontrolled ([place])    Chronic, improved A1c to 7's range. Encouraged she return for DM f/u.       Relevant Medications   glimepiride (AMARYL) 4 MG tablet   rosuvastatin (CRESTOR) 40 MG tablet   saxagliptin HCl (ONGLYZA) 5 MG TABS tablet   Fibromyalgia (Chronic)    Try TCA low dose nortritpyline. Intolerant of cymbalta, gabapentin, lyrica.       GERD    Has been taking omeprazole 63m bid. Will change to 420monce daily.       Relevant Medications   omeprazole (PRILOSEC) 40 MG capsule   Healthcare maintenance - Primary    Preventative protocols reviewed and updated unless pt declined. Discussed healthy diet and lifestyle.  Pap performed today. Overdue for colonoscopy, mammogram. Encouraged she schedule these appointments.       Relevant Orders   Cytology - PAP   HLD (hyperlipidemia)    LDL remains elevated despite hight dose crestor. Change rosuvastatin to nightly.  The 10-year ASCVD risk score (GMikey BussingC JrBrooke Bonito et al., 2013) is: 7.1%   Values used to calculate the score:     Age: [age]     Sex: Female     Is Non-Hispanic African American: No     Diabetic: Yes     Tobacco smoker: No     Systolic Blood Pressure: 12412mHg     Is BP treated: Yes     HDL Cholesterol: 39.6 mg/dL     Total Cholesterol: 246 mg/dL       Relevant Medications   diltiazem (CARTIA XT) 180 MG 24 hr capsule   hydrochlorothiazide (HYDRODIURIL) 25 MG tablet   metoprolol succinate (TOPROL-XL) 100 MG 24 hr tablet   rosuvastatin (CRESTOR) 40 MG tablet   HYPERTENSION, BENIGN    Chronic, stable. Continue current regimen.       Relevant Medications   diltiazem (CARTIA XT) 180 MG 24 hr capsule   hydrochlorothiazide (HYDRODIURIL) 25 MG tablet   metoprolol succinate (TOPROL-XL) 100 MG 24 hr tablet   rosuvastatin (CRESTOR) 40 MG tablet     MDD (major depressive disorder), recurrent episode, moderate (HCC)    Deteriorated with PHQ9 = 14. No SI/HI. continue wellbutrin 15029mid, add nortriptyline 44m75mghtly. H/o intolerance to cymbalta (leg swelling) and amitritpyline (oversedation).       Relevant Medications   buPROPion (WELLBUTRIN SR) 150 MG 12 hr tablet   nortriptyline (PAMELOR) 10 MG capsule   Morbid obesity with BMI of 50.0-59.9, adult (HCC) (Chronic)    Encouraged healthy diet and lifestyle changes to affect sustainable weight loss.       Relevant Medications   glimepiride (AMARYL) 4 MG tablet   saxagliptin HCl (ONGLYZA) 5 MG TABS tablet   OSA (obstructive sleep apnea)    CPAP never started.      SKIN RASH    Psoriatic rash - derm referral placed.       Relevant Orders   Ambulatory referral to Dermatology   Status post insertion of spinal cord stimulator       Meds ordered this encounter  Medications  . buPROPion (WELLBUTRIN SR) 150 MG 12 hr tablet    Sig: Take 1 tablet (  150 mg total) by mouth 2 (two) times daily. TAKE 1 TABLET TWICE A DAY    Dispense:  60 tablet    Refill:  11  . diltiazem (CARTIA XT) 180 MG 24 hr capsule    Sig: Take 1 capsule (180 mg total) by mouth daily.    Dispense:  90 capsule    Refill:  3  . glimepiride (AMARYL) 4 MG tablet    Sig: Take 1 tablet (4 mg total) by mouth daily before breakfast.    Dispense:  30 tablet    Refill:  11  . hydrochlorothiazide (HYDRODIURIL) 25 MG tablet    Sig: Take 1 tablet (25 mg total) by mouth daily as needed (swelling).    Dispense:  30 tablet    Refill:  6  . metoprolol succinate (TOPROL-XL) 100 MG 24 hr tablet    Sig: Take 1 tablet (100 mg total) by mouth 2 (two) times daily. Take with or immediately following a meal.    Dispense:  180 tablet    Refill:  3  . omeprazole (PRILOSEC) 40 MG capsule    Sig: Take 1 capsule (40 mg total) by mouth 2 (two) times daily.    Dispense:  90 capsule    Refill:  3  . rosuvastatin (CRESTOR) 40 MG  tablet    Sig: Take 1 tablet (40 mg total) by mouth daily.    Dispense:  90 tablet    Refill:  3    D/c lipitor.  . saxagliptin HCl (ONGLYZA) 5 MG TABS tablet    Sig: Take 1 tablet (5 mg total) by mouth daily.    Dispense:  90 tablet    Refill:  3  . nortriptyline (PAMELOR) 10 MG capsule    Sig: Take 1 capsule (10 mg total) by mouth at bedtime.    Dispense:  30 capsule    Refill:  3   Orders Placed This Encounter  Procedures  . Ambulatory referral to Dermatology    Referral Priority:   Routine    Referral Type:   Consultation    Referral Reason:   Specialty Services Required    Requested Specialty:   Dermatology    Number of Visits Requested:   1    Follow up plan: Return in about 3 months (around 02/20/2018) for follow up visit.  Ria Bush, MD

## 2017-11-20 NOTE — Assessment & Plan Note (Signed)
Try TCA low dose nortritpyline. Intolerant of cymbalta, gabapentin, lyrica.

## 2017-11-20 NOTE — Assessment & Plan Note (Signed)
Chronic, improved A1c to 7's range. Encouraged she return for DM f/u.

## 2017-11-20 NOTE — Assessment & Plan Note (Signed)
Encouraged healthy diet and lifestyle changes to affect sustainable weight loss.  

## 2017-11-22 LAB — CYTOLOGY - PAP
Diagnosis: NEGATIVE
HPV: NOT DETECTED

## 2017-12-11 DIAGNOSIS — L821 Other seborrheic keratosis: Secondary | ICD-10-CM | POA: Diagnosis not present

## 2017-12-11 DIAGNOSIS — L400 Psoriasis vulgaris: Secondary | ICD-10-CM | POA: Diagnosis not present

## 2017-12-11 DIAGNOSIS — D225 Melanocytic nevi of trunk: Secondary | ICD-10-CM | POA: Diagnosis not present

## 2017-12-18 DIAGNOSIS — D225 Melanocytic nevi of trunk: Secondary | ICD-10-CM | POA: Diagnosis not present

## 2017-12-18 DIAGNOSIS — L400 Psoriasis vulgaris: Secondary | ICD-10-CM | POA: Diagnosis not present

## 2017-12-18 DIAGNOSIS — D485 Neoplasm of uncertain behavior of skin: Secondary | ICD-10-CM | POA: Diagnosis not present

## 2017-12-18 DIAGNOSIS — L820 Inflamed seborrheic keratosis: Secondary | ICD-10-CM | POA: Diagnosis not present

## 2017-12-19 ENCOUNTER — Encounter: Payer: Self-pay | Admitting: Family Medicine

## 2017-12-19 NOTE — Telephone Encounter (Signed)
Pt would like to restart b12 shots-  Overdue. Can we call and schedule nurse visit? thanks

## 2017-12-23 DIAGNOSIS — M545 Low back pain: Secondary | ICD-10-CM | POA: Diagnosis not present

## 2017-12-23 DIAGNOSIS — M5136 Other intervertebral disc degeneration, lumbar region: Secondary | ICD-10-CM | POA: Diagnosis not present

## 2017-12-23 DIAGNOSIS — L409 Psoriasis, unspecified: Secondary | ICD-10-CM | POA: Diagnosis not present

## 2018-01-01 ENCOUNTER — Ambulatory Visit (INDEPENDENT_AMBULATORY_CARE_PROVIDER_SITE_OTHER): Payer: 59

## 2018-01-01 DIAGNOSIS — E538 Deficiency of other specified B group vitamins: Secondary | ICD-10-CM

## 2018-01-01 MED ORDER — CYANOCOBALAMIN 1000 MCG/ML IJ SOLN
1000.0000 ug | INTRAMUSCULAR | Status: DC
  Administered 2018-01-01: 1000 ug via INTRAMUSCULAR

## 2018-01-01 NOTE — Progress Notes (Signed)
Per orders of Dr. Danise Mina, injection of Vitamin B12 given by Lurlean Nanny. Patient tolerated injection well.

## 2018-01-21 ENCOUNTER — Ambulatory Visit: Payer: 59 | Admitting: Family Medicine

## 2018-02-04 ENCOUNTER — Ambulatory Visit: Payer: 59

## 2018-02-12 ENCOUNTER — Ambulatory Visit (INDEPENDENT_AMBULATORY_CARE_PROVIDER_SITE_OTHER): Payer: 59 | Admitting: *Deleted

## 2018-02-12 DIAGNOSIS — E538 Deficiency of other specified B group vitamins: Secondary | ICD-10-CM

## 2018-02-12 MED ORDER — CYANOCOBALAMIN 1000 MCG/ML IJ SOLN
1000.0000 ug | Freq: Once | INTRAMUSCULAR | Status: AC
Start: 2018-02-12 — End: 2018-02-12
  Administered 2018-02-12: 1000 ug via INTRAMUSCULAR

## 2018-02-12 NOTE — Progress Notes (Signed)
Per orders of Dr. Gutierrez, injection of B12 1000 mcg/mL given by Tiffaney Heimann. °Patient tolerated injection well. ° °

## 2018-02-20 ENCOUNTER — Ambulatory Visit: Payer: 59 | Admitting: Family Medicine

## 2018-05-04 DIAGNOSIS — Z23 Encounter for immunization: Secondary | ICD-10-CM | POA: Diagnosis not present

## 2018-07-15 ENCOUNTER — Ambulatory Visit: Payer: 59 | Admitting: Family Medicine

## 2018-07-15 ENCOUNTER — Encounter: Payer: Self-pay | Admitting: Family Medicine

## 2018-07-15 VITALS — BP 132/84 | HR 106 | Temp 98.8°F | Ht 64.5 in | Wt 302.8 lb

## 2018-07-15 DIAGNOSIS — I10 Essential (primary) hypertension: Secondary | ICD-10-CM

## 2018-07-15 DIAGNOSIS — R0609 Other forms of dyspnea: Secondary | ICD-10-CM

## 2018-07-15 DIAGNOSIS — R0600 Dyspnea, unspecified: Secondary | ICD-10-CM

## 2018-07-15 DIAGNOSIS — E7849 Other hyperlipidemia: Secondary | ICD-10-CM

## 2018-07-15 DIAGNOSIS — Z1211 Encounter for screening for malignant neoplasm of colon: Secondary | ICD-10-CM

## 2018-07-15 DIAGNOSIS — Z598 Other problems related to housing and economic circumstances: Secondary | ICD-10-CM

## 2018-07-15 DIAGNOSIS — G4733 Obstructive sleep apnea (adult) (pediatric): Secondary | ICD-10-CM

## 2018-07-15 DIAGNOSIS — E1165 Type 2 diabetes mellitus with hyperglycemia: Secondary | ICD-10-CM | POA: Diagnosis not present

## 2018-07-15 DIAGNOSIS — Z599 Problem related to housing and economic circumstances, unspecified: Secondary | ICD-10-CM

## 2018-07-15 DIAGNOSIS — Z1239 Encounter for other screening for malignant neoplasm of breast: Secondary | ICD-10-CM | POA: Diagnosis not present

## 2018-07-15 DIAGNOSIS — Z6841 Body Mass Index (BMI) 40.0 and over, adult: Secondary | ICD-10-CM

## 2018-07-15 LAB — POCT GLYCOSYLATED HEMOGLOBIN (HGB A1C): Hemoglobin A1C: 7.5 % — AB (ref 4.0–5.6)

## 2018-07-15 NOTE — Progress Notes (Signed)
BP 132/84 (BP Location: Left Arm, Patient Position: Sitting, Cuff Size: Large)   Pulse (!) 106   Temp 98.8 F (37.1 C) (Oral)   Ht 5' 4.5" (1.638 m)   Wt (!) 302 lb 12 oz (137.3 kg)   LMP 05/21/2014   SpO2 96%   BMI 51.16 kg/m    CC: preop eval Subjective:    Patient ID: Kathleen Good, female    DOB: Jul 05, 1960, 58 y.o.   MRN: [id number]  HPI: Kathleen Good is a 58 y.o. female presenting on 07/15/2018 for Procedure Clearance (Pt is having dental work done. Due to DM, dentist wants pt cleared by PCP first. )   Overdue for diabetes follow up.  Overdue for screening/preventative health care. Does not want to have colonoscopy. Agrees to iFOB.  Agrees to rpt mammogram as overdue  Needs to have dental work - 2 abscesses found. Dentist requested medical clearance prior to proceed with tooth extractions under local anesthesia (Dr Marcello Moores @ [facility])  DM - does regularly check sugars twice daily - fasting 100, postprandial 170. Highest sugar 220. Compliant with antihyperglycemic regimen which includes: amaryl 50m daily with breakfast (skips if she doesn't eat breakfast - has been skipping breakfast and amaryl the last few months) and onglyza 574mdaily. Denies recent low sugars or hypoglycemic symptoms. Some foot paresthesias. Last diabetic eye exam DUE - later this year. Pneumovax: 08/2013. Prevnar: not due. Glucometer brand: one touch. DSME: has not completed, declines. Lab Results  Component Value Date   HGBA1C 7.5 (A) 07/15/2018   Diabetic Foot Exam - Simple   Simple Foot Form Diabetic Foot exam was performed with the following findings:  Yes 07/15/2018  4:18 PM  Visual Inspection No deformities, no ulcerations, no other skin breakdown bilaterally:  Yes Sensation Testing See comments:  Yes Pulse Check Posterior Tibialis and Dorsalis pulse intact bilaterally:  Yes Comments Diminished sensation to monofilament L sole    Lab Results  Component Value Date   MICROALBUR 7.4 (H)  06/22/2016    Denies chest pain, tightness, dizziness. Chronic exertional dyspnea going up 1 flight of stairs. Some palpitations with exertion - also chronic. Has previously had cardiac evaluation.  Frequent migraines. CARDIAC CATHETERIZATION 2000 - Normal DOBUTAMINE STRESS ECHO 2011 - no ischemia  OSA - not using CPAP machine - never got a machine - unaffordable.      Relevant past medical, surgical, family and social history reviewed and updated as indicated. Interim medical history since our last visit reviewed. Allergies and medications reviewed and updated. Outpatient Medications Prior to Visit  Medication Sig Dispense Refill  . aspirin EC 81 MG tablet Take 81 mg by mouth daily with supper.    . Blood Glucose Monitoring Suppl KIT Use to check sugar once daily 2 hours after a meal. Dx: E11.65 **Dispense per insurance guidelines** 1 each 0  . buPROPion (WELLBUTRIN SR) 150 MG 12 hr tablet Take 1 tablet (150 mg total) by mouth 2 (two) times daily. TAKE 1 TABLET TWICE A DAY 60 tablet 11  . clobetasol ointment (TEMOVATE) 0.9.48 Apply 1 application topically 2 (two) times daily.    . cyanocobalamin (,VITAMIN B-12,) 1000 MCG/ML injection Inject 1 mL (1,000 mcg total) into the muscle every 30 (thirty) days. 1 mL 0  . cyclobenzaprine (FLEXERIL) 10 MG tablet Take 10 mg by mouth 2 (two) times daily as needed for muscle spasms (back pain).     . Marland Kitcheniltiazem (CARTIA XT) 180 MG 24 hr capsule  Take 1 capsule (180 mg total) by mouth daily. 90 capsule 3  . diphenhydramine-acetaminophen (TYLENOL PM) 25-500 MG TABS tablet Take 2 tablets by mouth at bedtime as needed (pain).    Marland Kitchen glimepiride (AMARYL) 4 MG tablet Take 1 tablet (4 mg total) by mouth daily before breakfast. 30 tablet 11  . glucose blood (ONE TOUCH ULTRA TEST) test strip Use to check sugar twice daily. Dispense based on insurance preference. Dx:E11.65 180 each 3  . hydrochlorothiazide (HYDRODIURIL) 25 MG tablet Take 1 tablet (25 mg total) by  mouth daily as needed (swelling). 30 tablet 6  . ibuprofen (ADVIL,MOTRIN) 200 MG tablet Take 400 mg by mouth every 6 (six) hours as needed for headache (pain).    . Melatonin 5 MG TABS Take 1 tablet by mouth at bedtime as needed.    . metoprolol succinate (TOPROL-XL) 100 MG 24 hr tablet Take 1 tablet (100 mg total) by mouth 2 (two) times daily. Take with or immediately following a meal. 180 tablet 3  . omeprazole (PRILOSEC) 40 MG capsule Take 1 capsule (40 mg total) by mouth 2 (two) times daily. 90 capsule 3  . ONETOUCH DELICA LANCETS 81Y MISC Use to check sugar twice daily as needed. Dispense based on insurance preference. Dx: E11.65 180 each 3  . propranolol (INDERAL) 20 MG tablet Take 1 tablet (20 mg total) by mouth 3 (three) times daily as needed. (Patient taking differently: Take 20 mg by mouth 3 (three) times daily as needed (heart palpitations). ) 90 tablet 3  . rosuvastatin (CRESTOR) 40 MG tablet Take 1 tablet (40 mg total) by mouth daily. 90 tablet 3  . saxagliptin HCl (ONGLYZA) 5 MG TABS tablet Take 1 tablet (5 mg total) by mouth daily. 90 tablet 3  . clobetasol cream (TEMOVATE) 5.90 % Apply 1 application topically 2 (two) times daily as needed. 45 g 1  . nortriptyline (PAMELOR) 10 MG capsule Take 1 capsule (10 mg total) by mouth at bedtime. 30 capsule 3   Facility-Administered Medications Prior to Visit  Medication Dose Route Frequency Provider Last Rate Last Dose  . cyanocobalamin ((VITAMIN B-12)) injection 1,000 mcg  1,000 mcg Intramuscular Q30 days Ria Bush, MD   1,000 mcg at 01/01/18 1013     Per HPI unless specifically indicated in ROS section below Review of Systems Objective:    BP 132/84 (BP Location: Left Arm, Patient Position: Sitting, Cuff Size: Large)   Pulse (!) 106   Temp 98.8 F (37.1 C) (Oral)   Ht 5' 4.5" (1.638 m)   Wt (!) 302 lb 12 oz (137.3 kg)   LMP 05/21/2014   SpO2 96%   BMI 51.16 kg/m   Wt Readings from Last 3 Encounters:  07/15/18 (!) 302  lb 12 oz (137.3 kg)  11/20/17 (!) 301 lb (136.5 kg)  01/21/17 297 lb 12.8 oz (135.1 kg)    Physical Exam Vitals signs and nursing note reviewed.  Constitutional:      General: She is not in acute distress.    Appearance: Normal appearance. She is well-developed. She is obese. She is not ill-appearing.  HENT:     Head: Normocephalic and atraumatic.     Right Ear: External ear normal.     Left Ear: External ear normal.     Nose: Nose normal.     Mouth/Throat:     Mouth: Mucous membranes are dry.     Pharynx: Oropharynx is clear. No oropharyngeal exudate.  Eyes:     General: No  scleral icterus.    Conjunctiva/sclera: Conjunctivae normal.     Pupils: Pupils are equal, round, and reactive to light.  Neck:     Musculoskeletal: Normal range of motion and neck supple.  Cardiovascular:     Rate and Rhythm: Normal rate and regular rhythm.     Heart sounds: Normal heart sounds. No murmur.  Pulmonary:     Effort: Pulmonary effort is normal. No respiratory distress.     Breath sounds: Normal breath sounds. No wheezing or rales.  Musculoskeletal:     Comments: See HPI for foot exam if done  Lymphadenopathy:     Cervical: No cervical adenopathy.  Skin:    General: Skin is warm and dry.     Findings: No rash.  Neurological:     Mental Status: She is alert.       Results for orders placed or performed in visit on 07/15/18  POCT glycosylated hemoglobin (Hb A1C)  Result Value Ref Range   Hemoglobin A1C 7.5 (A) 4.0 - 5.6 %   HbA1c POC (<> result, manual entry)     HbA1c, POC (prediabetic range)     HbA1c, POC (controlled diabetic range)     EKG - NSR rate 90s, normal axis, intervals, no acute ST/T changes.  Assessment & Plan:  Mammogram ordered - states insurance covers preventative healthcare well. Stool kit provided today.  Problem List Items Addressed This Visit    OSA (obstructive sleep apnea)    Was unable to afford CPAP machine. Will see if there's any financial assistance  programs available.       Morbid obesity with BMI of 50.0-59.9, adult (HCC) (Chronic)   HYPERTENSION, BENIGN   Relevant Orders   EKG 12-Lead (Completed)   HLD (hyperlipidemia)    Update FLP when returns fasting. Compliant with crestor 24m daily. Continue aspirin 810mdaily. The 10-year ASCVD risk score (GMikey BussingC JrBrooke Bonito et al., 2013) is: 9.1%   Values used to calculate the score:     Age: [age]     Sex: Female     Is Non-Hispanic African American: No     Diabetic: Yes     Tobacco smoker: No     Systolic Blood Pressure: 13893mHg     Is BP treated: Yes     HDL Cholesterol: 39.6 mg/dL     Total Cholesterol: 246 mg/dL       Financial difficulties    Medical cost is large barrier to accessing care. Will see if there's any financial assistance available to this patient.       Diabetes type 2, uncontrolled (HCC) - Primary    Chronic, stable based on today's A1c. Foot exam today. Due for eye exam, diabetes classes, trouble affording. Continue onglyza with PRN amaryl with breakfast. Should be ok to proceed with upcoming dental work.       Relevant Orders   POCT glycosylated hemoglobin (Hb A1C) (Completed)   Chronic dyspnea    Ongoing chronic exertional dyspnea, has previously had cardiac evaluation - last saw cardiologist 2016 and is overdue for follow up but states unable to afford return at this time. At prior cardiac evaluation, thought multifactorial related to deconditioning and obesity. Denies chest pain/tightness. Will update EKG today.        Other Visit Diagnoses    Breast cancer screening       Relevant Orders   MM Digital Screening   Special screening for malignant neoplasms, colon       Relevant  Orders   Fecal occult blood, imunochemical       No orders of the defined types were placed in this encounter.  Orders Placed This Encounter  Procedures  . Fecal occult blood, imunochemical    Standing Status:   Future    Standing Expiration Date:   07/18/2019  . MM  Digital Screening    Standing Status:   Future    Standing Expiration Date:   09/13/2019    Order Specific Question:   Reason for Exam (SYMPTOM  OR DIAGNOSIS REQUIRED)    Answer:   breast cancer screening    Order Specific Question:   Is the patient pregnant?    Answer:   No    Order Specific Question:   Preferred imaging location?    Answer:   [facility]  . POCT glycosylated hemoglobin (Hb A1C)  . EKG 12-Lead    Patient Instructions  Consider diabetes classes.  A1c today.  Pick up stool kit from lab today.  EKG today.  Return fasting at your convenience for labwork. Mammogram ordered.     Follow up plan: Return in about 4 months (around 11/13/2018) for annual exam, prior fasting for blood work.  Ria Bush, MD

## 2018-07-15 NOTE — Patient Instructions (Addendum)
Consider diabetes classes.  A1c today.  Pick up stool kit from lab today.  EKG today.  Return fasting at your convenience for labwork. Mammogram ordered.

## 2018-07-17 ENCOUNTER — Other Ambulatory Visit: Payer: 59

## 2018-07-17 ENCOUNTER — Other Ambulatory Visit: Payer: Self-pay | Admitting: Family Medicine

## 2018-07-17 ENCOUNTER — Encounter: Payer: Self-pay | Admitting: Family Medicine

## 2018-07-17 DIAGNOSIS — E559 Vitamin D deficiency, unspecified: Secondary | ICD-10-CM

## 2018-07-17 DIAGNOSIS — Z598 Other problems related to housing and economic circumstances: Secondary | ICD-10-CM | POA: Insufficient documentation

## 2018-07-17 DIAGNOSIS — E538 Deficiency of other specified B group vitamins: Secondary | ICD-10-CM

## 2018-07-17 DIAGNOSIS — Z599 Problem related to housing and economic circumstances, unspecified: Secondary | ICD-10-CM | POA: Insufficient documentation

## 2018-07-17 DIAGNOSIS — E1165 Type 2 diabetes mellitus with hyperglycemia: Secondary | ICD-10-CM

## 2018-07-17 DIAGNOSIS — E7849 Other hyperlipidemia: Secondary | ICD-10-CM

## 2018-07-17 NOTE — Assessment & Plan Note (Addendum)
Chronic, stable based on today's A1c. Foot exam today. Due for eye exam, diabetes classes, trouble affording. Continue onglyza with PRN amaryl with breakfast. Should be ok to proceed with upcoming dental work.

## 2018-07-17 NOTE — Assessment & Plan Note (Signed)
Medical cost is large barrier to accessing care. Will see if there's any financial assistance available to this patient.

## 2018-07-17 NOTE — Assessment & Plan Note (Signed)
Ongoing chronic exertional dyspnea, has previously had cardiac evaluation - last saw cardiologist 2016 and is overdue for follow up but states unable to afford return at this time. At prior cardiac evaluation, thought multifactorial related to deconditioning and obesity. Denies chest pain/tightness. Will update EKG today.

## 2018-07-17 NOTE — Assessment & Plan Note (Addendum)
Update FLP when returns fasting. Compliant with crestor 40mg  daily. Continue aspirin 81mg  daily. The 10-year ASCVD risk score Mikey Bussing DC Brooke Bonito., et al., 2013) is: 9.1%   Values used to calculate the score:     Age: 58 years     Sex: Female     Is Non-Hispanic African American: No     Diabetic: Yes     Tobacco smoker: No     Systolic Blood Pressure: 241 mmHg     Is BP treated: Yes     HDL Cholesterol: 39.6 mg/dL     Total Cholesterol: 246 mg/dL

## 2018-07-17 NOTE — Assessment & Plan Note (Signed)
Was unable to afford CPAP machine. Will see if there's any financial assistance programs available.

## 2018-07-18 ENCOUNTER — Telehealth: Payer: Self-pay

## 2018-07-18 NOTE — Telephone Encounter (Signed)
Meg at [facility] said she received the medical clearance but could not read the A1C. Advised 11/14/17 A1C was 7.6. Meg voiced understanding. Nothing further needed.

## 2018-07-23 ENCOUNTER — Other Ambulatory Visit (INDEPENDENT_AMBULATORY_CARE_PROVIDER_SITE_OTHER): Payer: 59

## 2018-07-23 DIAGNOSIS — E559 Vitamin D deficiency, unspecified: Secondary | ICD-10-CM | POA: Diagnosis not present

## 2018-07-23 DIAGNOSIS — E1165 Type 2 diabetes mellitus with hyperglycemia: Secondary | ICD-10-CM | POA: Diagnosis not present

## 2018-07-23 DIAGNOSIS — E7849 Other hyperlipidemia: Secondary | ICD-10-CM

## 2018-07-23 DIAGNOSIS — E538 Deficiency of other specified B group vitamins: Secondary | ICD-10-CM | POA: Diagnosis not present

## 2018-07-23 LAB — COMPREHENSIVE METABOLIC PANEL
ALT: 16 U/L (ref 0–35)
AST: 14 U/L (ref 0–37)
Albumin: 3.7 g/dL (ref 3.5–5.2)
Alkaline Phosphatase: 106 U/L (ref 39–117)
BUN: 10 mg/dL (ref 6–23)
CO2: 25 mEq/L (ref 19–32)
Calcium: 8.7 mg/dL (ref 8.4–10.5)
Chloride: 101 mEq/L (ref 96–112)
Creatinine, Ser: 0.9 mg/dL (ref 0.40–1.20)
GFR: 64.87 mL/min (ref >60.00)
Glucose, Bld: 204 mg/dL — ABNORMAL HIGH (ref 70–99)
Potassium: 3.8 mEq/L (ref 3.5–5.1)
Sodium: 136 mEq/L (ref 135–145)
Total Bilirubin: 0.5 mg/dL (ref 0.2–1.2)
Total Protein: 7.2 g/dL (ref 6.0–8.3)

## 2018-07-23 LAB — LIPID PANEL
Cholesterol: 293 mg/dL — ABNORMAL HIGH (ref 0–200)
HDL: 34.5 mg/dL — ABNORMAL LOW (ref >39.00)
LDL Cholesterol: 220 mg/dL — ABNORMAL HIGH (ref 0–99)
NonHDL: 258.78
Total CHOL/HDL Ratio: 9
Triglycerides: 192 mg/dL — ABNORMAL HIGH (ref 0.0–149.0)
VLDL: 38.4 mg/dL (ref 0.0–40.0)

## 2018-07-23 LAB — MICROALBUMIN / CREATININE URINE RATIO
Creatinine,U: 483.9 mg/dL
Microalb Creat Ratio: 2.3 mg/g (ref 0.0–30.0)
Microalb, Ur: 11.3 mg/dL — ABNORMAL HIGH (ref 0.0–1.9)

## 2018-07-23 LAB — VITAMIN B12: Vitamin B-12: 203 pg/mL — ABNORMAL LOW (ref 211–911)

## 2018-07-23 LAB — VITAMIN D 25 HYDROXY (VIT D DEFICIENCY, FRACTURES): VITD: 25.54 ng/mL — ABNORMAL LOW (ref 30.00–100.00)

## 2018-07-27 ENCOUNTER — Other Ambulatory Visit: Payer: Self-pay | Admitting: Family Medicine

## 2018-07-27 MED ORDER — B-12 1000 MCG SL SUBL: 1.0000 | SUBLINGUAL_TABLET | Freq: Every day | SUBLINGUAL | Status: DC

## 2018-07-27 MED ORDER — VITAMIN D3 25 MCG (1000 UT) PO CAPS: 1.0000 | ORAL_CAPSULE | Freq: Every day | ORAL | Status: DC

## 2018-07-28 ENCOUNTER — Encounter: Payer: Self-pay | Admitting: Family Medicine

## 2018-08-19 ENCOUNTER — Telehealth: Payer: Self-pay | Admitting: Internal Medicine

## 2018-08-19 NOTE — Telephone Encounter (Signed)
Pt would like to schedule a recall colon.  Pt stated that she had the colonoscopy at [facility] last time.  Please advise scheduling.

## 2018-08-19 NOTE — Telephone Encounter (Signed)
Patient is advised that I will call her once we have a schedule for April.

## 2018-08-21 DIAGNOSIS — R252 Cramp and spasm: Secondary | ICD-10-CM | POA: Diagnosis not present

## 2018-08-21 DIAGNOSIS — M5412 Radiculopathy, cervical region: Secondary | ICD-10-CM | POA: Diagnosis not present

## 2018-08-22 ENCOUNTER — Ambulatory Visit: Payer: 59

## 2018-09-01 ENCOUNTER — Encounter: Payer: Self-pay | Admitting: Family Medicine

## 2018-09-05 MED ORDER — "NEEDLE (DISP) 25G X 1"" MISC": 1 refills | Status: DC

## 2018-09-05 MED ORDER — "NEEDLE (DISP) 18G X 1.25"" MISC": 1.0000 [IU] | 1 refills | Status: DC

## 2018-09-05 MED ORDER — CYANOCOBALAMIN 1000 MCG/ML IJ SOLN: 1000.0000 ug | INTRAMUSCULAR | 3 refills | Status: DC

## 2018-11-24 ENCOUNTER — Telehealth: Payer: Self-pay

## 2018-11-24 NOTE — Telephone Encounter (Signed)
-----   Message from Marlon Pel, RN sent at 10/13/2018  8:08 AM EDT -----  ----- Message ----- From: Marlon Pel, RN Sent: 10/12/2018 To: Marlon Pel, RN   ----- Message ----- From: Marlon Pel, RN Sent: 09/07/2018 To: Marlon Pel, RN  Patient needs hospital procedure with Carlean Purl see results 08/19/18.

## 2018-11-24 NOTE — Telephone Encounter (Signed)
Patient is due for recall colonoscopy at [facility].  Left message for patient to call back that I can offer her 12/16/18.

## 2018-11-26 NOTE — Telephone Encounter (Signed)
Left message for patient to call back  

## 2018-12-01 NOTE — Telephone Encounter (Signed)
I left a message for the patient to call back to schedule hospital procedure at her convenience.

## 2018-12-02 ENCOUNTER — Other Ambulatory Visit: Payer: Self-pay | Admitting: Family Medicine

## 2019-01-05 ENCOUNTER — Other Ambulatory Visit: Payer: Self-pay | Admitting: Family Medicine

## 2019-01-14 ENCOUNTER — Ambulatory Visit: Payer: 59 | Admitting: Family Medicine

## 2019-02-01 ENCOUNTER — Encounter: Payer: Self-pay | Admitting: Family Medicine

## 2019-02-01 ENCOUNTER — Other Ambulatory Visit: Payer: Self-pay | Admitting: Family Medicine

## 2019-02-02 ENCOUNTER — Encounter: Payer: Self-pay | Admitting: Family Medicine

## 2019-02-02 NOTE — Telephone Encounter (Signed)
See other mychart message.

## 2019-02-05 ENCOUNTER — Encounter: Payer: Self-pay | Admitting: Family Medicine

## 2019-02-05 DIAGNOSIS — M546 Pain in thoracic spine: Secondary | ICD-10-CM | POA: Insufficient documentation

## 2019-02-13 ENCOUNTER — Encounter: Payer: Self-pay | Admitting: Family Medicine

## 2019-02-13 ENCOUNTER — Other Ambulatory Visit: Payer: Self-pay

## 2019-02-13 ENCOUNTER — Ambulatory Visit: Payer: 59 | Admitting: Family Medicine

## 2019-02-13 VITALS — BP 120/72 | HR 80 | Temp 97.8°F | Ht 64.5 in | Wt 303.5 lb

## 2019-02-13 DIAGNOSIS — M545 Low back pain, unspecified: Secondary | ICD-10-CM

## 2019-02-13 DIAGNOSIS — E1165 Type 2 diabetes mellitus with hyperglycemia: Secondary | ICD-10-CM

## 2019-02-13 DIAGNOSIS — G8929 Other chronic pain: Secondary | ICD-10-CM

## 2019-02-13 DIAGNOSIS — IMO0002 Reserved for concepts with insufficient information to code with codable children: Secondary | ICD-10-CM

## 2019-02-13 DIAGNOSIS — E118 Type 2 diabetes mellitus with unspecified complications: Secondary | ICD-10-CM | POA: Diagnosis not present

## 2019-02-13 DIAGNOSIS — Z6841 Body Mass Index (BMI) 40.0 and over, adult: Secondary | ICD-10-CM | POA: Diagnosis not present

## 2019-02-13 LAB — POCT GLYCOSYLATED HEMOGLOBIN (HGB A1C): Hemoglobin A1C: 9.6 % — AB (ref 4.0–5.6)

## 2019-02-13 MED ORDER — GLIMEPIRIDE 4 MG PO TABS: 4.0000 mg | ORAL_TABLET | Freq: Two times a day (BID) | ORAL | 3 refills | Status: DC

## 2019-02-13 MED ORDER — METOPROLOL SUCCINATE ER 100 MG PO TB24: 100.0000 mg | ORAL_TABLET | Freq: Two times a day (BID) | ORAL | 3 refills | Status: DC

## 2019-02-13 MED ORDER — SAXAGLIPTIN HCL 5 MG PO TABS: 5.0000 mg | ORAL_TABLET | Freq: Every day | ORAL | 6 refills | Status: DC

## 2019-02-13 MED ORDER — GLUCOSE BLOOD VI STRP: ORAL_STRIP | 3 refills | Status: DC

## 2019-02-13 MED ORDER — OZEMPIC (0.25 OR 0.5 MG/DOSE) 2 MG/1.5ML ~~LOC~~ SOPN: PEN_INJECTOR | SUBCUTANEOUS | 3 refills | Status: DC

## 2019-02-13 MED ORDER — DIPHENHYDRAMINE-APAP (SLEEP) 25-500 MG PO TABS: 2.0000 | ORAL_TABLET | Freq: Every evening | ORAL | Status: AC | PRN

## 2019-02-13 MED ORDER — CLOBETASOL PROPIONATE 0.05 % EX OINT: 1.0000 "application " | TOPICAL_OINTMENT | Freq: Two times a day (BID) | CUTANEOUS | 1 refills | Status: DC

## 2019-02-13 MED ORDER — BUPROPION HCL ER (SR) 150 MG PO TB12: 150.0000 mg | ORAL_TABLET | Freq: Two times a day (BID) | ORAL | 3 refills | Status: DC

## 2019-02-13 MED ORDER — ROSUVASTATIN CALCIUM 40 MG PO TABS: 40.0000 mg | ORAL_TABLET | Freq: Every day | ORAL | 3 refills | Status: DC

## 2019-02-13 MED ORDER — ONETOUCH DELICA LANCETS 33G MISC: 3 refills | Status: DC

## 2019-02-13 MED ORDER — OMEPRAZOLE 40 MG PO CPDR: 40.0000 mg | DELAYED_RELEASE_CAPSULE | Freq: Two times a day (BID) | ORAL | 3 refills | Status: DC

## 2019-02-13 NOTE — Progress Notes (Signed)
This visit was conducted in person.  BP 120/72 (BP Location: Left Arm, Patient Position: Sitting, Cuff Size: Large)   Pulse 80   Temp 97.8 F (36.6 C) (Temporal)   Ht 5' 4.5" (1.638 m)   Wt (!) 303 lb 8 oz (137.7 kg)   LMP 05/21/2014   SpO2 96%   BMI 51.29 kg/m    CC: 6 mo f/u visit Subjective:    Patient ID: Kathleen Good, female    DOB: [birth year], 58 y.o.   MRN: [id number]  HPI: Kathleen Good is a 58 y.o. female presenting on 02/13/2019 for Follow-up (Here for 6 mo f/u.)   Chronic lower back pain - planning steroid injections next week.   DM - does regularly check sugars 2-3 times a day over last few weeks after prednisone shot sugars up: fasting 200-300s, 200s throughout the day. Compliant with antihyperglycemic regimen which includes: amaryl 37m bid, onglyza 534mdaily. Unable to tolerate metformin. Denies low sugars or hypoglycemic symptoms. + paresthesias of fingers and toes. Last diabetic eye exam DUE. Pneumovax: 08/2013. Prevnar: not due. Glucometer brand: onetouch. DSME: has not completed - declines at this time. Lab Results  Component Value Date   HGBA1C 9.6 (A) 02/13/2019   Diabetic Foot Exam - Simple   Simple Foot Form Diabetic Foot exam was performed with the following findings: Yes 02/13/2019  4:34 PM  Visual Inspection No deformities, no ulcerations, no other skin breakdown bilaterally: Yes Sensation Testing See comments: Yes Pulse Check Posterior Tibialis and Dorsalis pulse intact bilaterally: Yes Comments Diminished sensation to monofilament testing    Lab Results  Component Value Date   MICROALBUR 11.3 (H) 07/23/2018        Relevant past medical, surgical, family and social history reviewed and updated as indicated. Interim medical history since our last visit reviewed. Allergies and medications reviewed and updated. Outpatient Medications Prior to Visit  Medication Sig Dispense Refill  . aspirin EC 81 MG tablet Take 81 mg by mouth daily with  supper.    . baclofen (LIORESAL) 10 MG tablet Take 1 tablet by mouth 3 (three) times daily as needed.    . Blood Glucose Monitoring Suppl KIT Use to check sugar once daily 2 hours after a meal. Dx: E11.65 **Dispense per insurance guidelines** 1 each 0  . Cholecalciferol (VITAMIN D3) 25 MCG (1000 UT) CAPS Take 1 capsule (1,000 Units total) by mouth daily. 30 capsule   . cyanocobalamin (,VITAMIN B-12,) 1000 MCG/ML injection Inject 1 mL (1,000 mcg total) into the muscle every 30 (thirty) days. 3 mL 3  . Cyanocobalamin (B-12) 1000 MCG SUBL Place 1 tablet under the tongue daily. 30 each   . diltiazem (TIAZAC) 180 MG 24 hr capsule TAKE ONE CAPSULE BY MOUTH DAILY 30 capsule 0  . hydrochlorothiazide (HYDRODIURIL) 25 MG tablet TAKE ONE TABLET BY MOUTH DAILY AS NEEDED FOR SWELLING 30 tablet 1  . ibuprofen (ADVIL,MOTRIN) 200 MG tablet Take 400 mg by mouth every 6 (six) hours as needed for headache (pain).    . Melatonin 5 MG TABS Take 1 tablet by mouth at bedtime as needed.    . Needle, Disp, 18G X 1.25" MISC 1 Units by Does not apply route every 30 (thirty) days. For b12 injection 10 each 1  . NEEDLE, DISP, 25 G 25G X 1" MISC Use as directed - 1 needle monthly for b12 shot administration 10 each 1  . propranolol (INDERAL) 20 MG tablet Take 1 tablet (20 mg total) by  mouth 3 (three) times daily as needed. (Patient taking differently: Take 20 mg by mouth 3 (three) times daily as needed (heart palpitations). ) 90 tablet 3  . traMADol (ULTRAM) 50 MG tablet Take 1 tablet by mouth 3 (three) times daily as needed.    Marland Kitchen buPROPion (WELLBUTRIN SR) 150 MG 12 hr tablet TAKE ONE TABLET BY MOUTH TWO TIMES A DAY 60 tablet 0  . clobetasol ointment (TEMOVATE) 9.77 % Apply 1 application topically 2 (two) times daily.    . diphenhydramine-acetaminophen (TYLENOL PM) 25-500 MG TABS tablet Take 2 tablets by mouth at bedtime as needed (pain).    Marland Kitchen glimepiride (AMARYL) 4 MG tablet Take 1 tablet (4 mg total) by mouth 2 (two) times  daily. 60 tablet 0  . glucose blood (ONE TOUCH ULTRA TEST) test strip Use to check sugar twice daily. Dispense based on insurance preference. Dx:E11.65 180 each 3  . metoprolol succinate (TOPROL-XL) 100 MG 24 hr tablet TAKE ONE TABLET BY MOUTH TWICE A DAY WITH OR IMMEDIATELY FOLLOWING A MEAL 60 tablet 2  . omeprazole (PRILOSEC) 40 MG capsule TAKE ONE CAPSULE BY MOUTH TWICE A DAY 60 capsule 0  . ONETOUCH DELICA LANCETS 41S MISC Use to check sugar twice daily as needed. Dispense based on insurance preference. Dx: E11.65 180 each 3  . ONGLYZA 5 MG TABS tablet TAKE ONE TABLET BY MOUTH DAILY 30 tablet 0  . rosuvastatin (CRESTOR) 40 MG tablet TAKE ONE TABLET BY MOUTH DAILY (NOTE: DISCONTINUE LIPITOR) 30 tablet 0  . cyclobenzaprine (FLEXERIL) 10 MG tablet Take 10 mg by mouth 2 (two) times daily as needed for muscle spasms (back pain).      No facility-administered medications prior to visit.      Per HPI unless specifically indicated in ROS section below Review of Systems Objective:    BP 120/72 (BP Location: Left Arm, Patient Position: Sitting, Cuff Size: Large)   Pulse 80   Temp 97.8 F (36.6 C) (Temporal)   Ht 5' 4.5" (1.638 m)   Wt (!) 303 lb 8 oz (137.7 kg)   LMP 05/21/2014   SpO2 96%   BMI 51.29 kg/m   Wt Readings from Last 3 Encounters:  02/13/19 (!) 303 lb 8 oz (137.7 kg)  07/15/18 (!) 302 lb 12 oz (137.3 kg)  11/20/17 (!) 301 lb (136.5 kg)    Physical Exam Vitals signs and nursing note reviewed.  Constitutional:      General: She is not in acute distress.    Appearance: She is well-developed.  HENT:     Head: Normocephalic and atraumatic.     Right Ear: External ear normal.     Left Ear: External ear normal.     Nose: Nose normal.     Mouth/Throat:     Pharynx: No oropharyngeal exudate.  Eyes:     General: No scleral icterus.    Conjunctiva/sclera: Conjunctivae normal.     Pupils: Pupils are equal, round, and reactive to light.  Neck:     Musculoskeletal: Normal  range of motion and neck supple.  Cardiovascular:     Rate and Rhythm: Normal rate and regular rhythm.     Heart sounds: Normal heart sounds. No murmur.  Pulmonary:     Effort: Pulmonary effort is normal. No respiratory distress.     Breath sounds: Normal breath sounds. No wheezing or rales.  Musculoskeletal:     Comments: See HPI for foot exam if done  Lymphadenopathy:     Cervical: No  cervical adenopathy.  Skin:    General: Skin is warm and dry.     Findings: No rash.       Results for orders placed or performed in visit on 02/13/19  POCT glycosylated hemoglobin (Hb A1C)  Result Value Ref Range   Hemoglobin A1C 9.6 (A) 4.0 - 5.6 %   HbA1c POC (<> result, manual entry)     HbA1c, POC (prediabetic range)     HbA1c, POC (controlled diabetic range)     Assessment & Plan:   Problem List Items Addressed This Visit    Morbid obesity with BMI of 50.0-59.9, adult (HCC) (Chronic)   Relevant Medications   glimepiride (AMARYL) 4 MG tablet   saxagliptin HCl (ONGLYZA) 5 MG TABS tablet   Semaglutide,0.25 or 0.5MG/DOS, (OZEMPIC, 0.25 OR 0.5 MG/DOSE,) 2 MG/1.5ML SOPN   Diabetes mellitus type 2, uncontrolled, with complications (HCC) - Primary    Chronic, deteriorated after starting prednisone taper earlier this month. Now off prednisone, but sugars staying high despite increased amaryl. Will switch from DPP4 to GLP1 RA - pt will price out ozempic vs trulicity. Reviewed mechanism of action of weekly injection. No fmhx or personal history of C-cell thyroid tumors. Will also refer to diabetes education. RTC 3 mo f/u visit.       Relevant Medications   glimepiride (AMARYL) 4 MG tablet   saxagliptin HCl (ONGLYZA) 5 MG TABS tablet   rosuvastatin (CRESTOR) 40 MG tablet   Semaglutide,0.25 or 0.5MG/DOS, (OZEMPIC, 0.25 OR 0.5 MG/DOSE,) 2 MG/1.5ML SOPN   Other Relevant Orders   POCT glycosylated hemoglobin (Hb A1C) (Completed)   Ambulatory referral to diabetic education   Chronic back pain     Planning ESI through PMR next week      Relevant Medications   baclofen (LIORESAL) 10 MG tablet   traMADol (ULTRAM) 50 MG tablet   buPROPion (WELLBUTRIN SR) 150 MG 12 hr tablet       Meds ordered this encounter  Medications  . buPROPion (WELLBUTRIN SR) 150 MG 12 hr tablet    Sig: Take 1 tablet (150 mg total) by mouth 2 (two) times daily.    Dispense:  180 tablet    Refill:  3  . clobetasol ointment (TEMOVATE) 0.05 %    Sig: Apply 1 application topically 2 (two) times daily.    Dispense:  60 g    Refill:  1  . diphenhydramine-acetaminophen (TYLENOL PM) 25-500 MG TABS tablet    Sig: Take 2 tablets by mouth at bedtime as needed (pain).  Marland Kitchen glimepiride (AMARYL) 4 MG tablet    Sig: Take 1 tablet (4 mg total) by mouth 2 (two) times daily.    Dispense:  180 tablet    Refill:  3  . metoprolol succinate (TOPROL-XL) 100 MG 24 hr tablet    Sig: Take 1 tablet (100 mg total) by mouth 2 (two) times daily. Take with or immediately following a meal.    Dispense:  180 tablet    Refill:  3  . omeprazole (PRILOSEC) 40 MG capsule    Sig: Take 1 capsule (40 mg total) by mouth 2 (two) times daily.    Dispense:  180 capsule    Refill:  3  . saxagliptin HCl (ONGLYZA) 5 MG TABS tablet    Sig: Take 1 tablet (5 mg total) by mouth daily.    Dispense:  30 tablet    Refill:  6  . rosuvastatin (CRESTOR) 40 MG tablet    Sig: Take 1  tablet (40 mg total) by mouth daily.    Dispense:  90 tablet    Refill:  3  . glucose blood (ONE TOUCH ULTRA TEST) test strip    Sig: Use to check sugar twice daily. Dispense based on insurance preference. Dx:E11.65    Dispense:  180 each    Refill:  3  . OneTouch Delica Lancets 57V MISC    Sig: Use to check sugar twice daily as needed. Dispense based on insurance preference. Dx: E11.65    Dispense:  180 each    Refill:  3  . Semaglutide,0.25 or 0.5MG/DOS, (OZEMPIC, 0.25 OR 0.5 MG/DOSE,) 2 MG/1.5ML SOPN    Sig: Inject 0.25 mg into the skin once a week for 14 days, THEN  0.5 mg once a week.    Dispense:  1 pen    Refill:  3   Orders Placed This Encounter  Procedures  . Ambulatory referral to diabetic education    Referral Priority:   Routine    Referral Type:   Consultation    Referral Reason:   Specialty Services Required    Number of Visits Requested:   1  . POCT glycosylated hemoglobin (Hb A1C)    Follow up plan: Return in about 3 months (around 05/16/2019) for follow up visit.  Ria Bush, MD

## 2019-02-13 NOTE — Assessment & Plan Note (Addendum)
Chronic, deteriorated after starting prednisone taper earlier this month. Now off prednisone, but sugars staying high despite increased amaryl. Will switch from DPP4 to GLP1 RA - pt will price out ozempic vs trulicity. Reviewed mechanism of action of weekly injection. No fmhx or personal history of C-cell thyroid tumors. Will also refer to diabetes education. RTC 3 mo f/u visit.

## 2019-02-13 NOTE — Patient Instructions (Addendum)
Schedule eye exam as you're due.  For diabetes - check to see which is more affordable - ozempic or trulicity. I have sent in ozempic shot 0.25mg  weekly for 2 weeks then increase to 0.5mg  weekly. Watch for nausea. This will be in place of onglyza. Look online for coupons.  We will refer you to diabetes classes.

## 2019-02-13 NOTE — Assessment & Plan Note (Signed)
Planning ESI through PMR next week

## 2019-02-20 ENCOUNTER — Encounter: Payer: Self-pay | Admitting: Family Medicine

## 2019-02-21 MED ORDER — FLUCONAZOLE 150 MG PO TABS: 150.0000 mg | ORAL_TABLET | Freq: Once | ORAL | 0 refills | Status: AC

## 2019-03-05 ENCOUNTER — Encounter: Payer: Self-pay | Admitting: Family Medicine

## 2019-03-06 ENCOUNTER — Other Ambulatory Visit: Payer: Self-pay | Admitting: Family Medicine

## 2019-03-06 NOTE — Telephone Encounter (Signed)
I do not see in the notes that you wanted pt to continue diltiazem.

## 2019-03-09 ENCOUNTER — Encounter: Payer: Self-pay | Admitting: Family Medicine

## 2019-03-09 MED ORDER — OZEMPIC (0.25 OR 0.5 MG/DOSE) 2 MG/1.5ML ~~LOC~~ SOPN: 0.5000 mg | PEN_INJECTOR | SUBCUTANEOUS | 1 refills | Status: AC

## 2019-03-09 MED ORDER — GLUCOSE BLOOD VI STRP: ORAL_STRIP | 3 refills | Status: DC

## 2019-03-09 NOTE — Telephone Encounter (Signed)
I have refilled this for patient. Thanks.

## 2019-03-09 NOTE — Telephone Encounter (Signed)
Pt's husband stopped by office to check status on refill because she is completely out and her blood pressure keeps going up.

## 2019-03-09 NOTE — Telephone Encounter (Signed)
Left message on vm per dpr notifying pt refill was sent to pharmacy.

## 2019-03-31 ENCOUNTER — Ambulatory Visit: Payer: 59 | Admitting: Dietician

## 2019-05-04 ENCOUNTER — Other Ambulatory Visit: Payer: Self-pay

## 2019-05-04 ENCOUNTER — Telehealth: Payer: Self-pay | Admitting: Internal Medicine

## 2019-05-04 DIAGNOSIS — Z1211 Encounter for screening for malignant neoplasm of colon: Secondary | ICD-10-CM

## 2019-05-04 NOTE — Telephone Encounter (Signed)
She has been scheduled for [facility] for 06/23/19 9:30.  She is aware of her COVID screen for 06/18/19 11:30 and pre-visit on 06/09/19 11:30

## 2019-05-05 NOTE — Telephone Encounter (Signed)
I confirmed with Maudie Mercury at [place] scheduling that the case has been posted and the patient is aware of all of the details.

## 2019-05-18 ENCOUNTER — Ambulatory Visit: Payer: 59 | Admitting: Family Medicine

## 2019-05-29 ENCOUNTER — Telehealth: Payer: Self-pay | Admitting: Internal Medicine

## 2019-05-29 NOTE — Telephone Encounter (Signed)
Procedure cancelled.

## 2019-06-01 ENCOUNTER — Ambulatory Visit: Payer: 59 | Admitting: Family Medicine

## 2019-06-01 ENCOUNTER — Telehealth: Payer: Self-pay | Admitting: Family Medicine

## 2019-06-01 MED ORDER — ALBUTEROL SULFATE (2.5 MG/3ML) 0.083% IN NEBU: 2.5000 mg | INHALATION_SOLUTION | Freq: Four times a day (QID) | RESPIRATORY_TRACT | 1 refills | Status: AC | PRN

## 2019-06-01 NOTE — Telephone Encounter (Signed)
Pt has doxy appointment 12/8 and needs refill on albuterol for her breathing machine.  She stated it a while since she has had this refilled  [place]

## 2019-06-01 NOTE — Telephone Encounter (Signed)
Pt uses neb PRN, usually when she gets sick.  E-scribed refill.

## 2019-06-02 ENCOUNTER — Other Ambulatory Visit: Payer: Self-pay

## 2019-06-02 ENCOUNTER — Ambulatory Visit (INDEPENDENT_AMBULATORY_CARE_PROVIDER_SITE_OTHER): Payer: 59 | Admitting: Family Medicine

## 2019-06-02 ENCOUNTER — Encounter: Payer: Self-pay | Admitting: Family Medicine

## 2019-06-02 VITALS — BP 127/77 | HR 81 | Temp 97.8°F | Ht 64.5 in | Wt 283.0 lb

## 2019-06-02 DIAGNOSIS — E1169 Type 2 diabetes mellitus with other specified complication: Secondary | ICD-10-CM | POA: Diagnosis not present

## 2019-06-02 DIAGNOSIS — E118 Type 2 diabetes mellitus with unspecified complications: Secondary | ICD-10-CM | POA: Diagnosis not present

## 2019-06-02 DIAGNOSIS — E538 Deficiency of other specified B group vitamins: Secondary | ICD-10-CM | POA: Diagnosis not present

## 2019-06-02 DIAGNOSIS — E785 Hyperlipidemia, unspecified: Secondary | ICD-10-CM

## 2019-06-02 MED ORDER — OZEMPIC (0.25 OR 0.5 MG/DOSE) 2 MG/1.5ML ~~LOC~~ SOPN: 0.5000 mg | PEN_INJECTOR | SUBCUTANEOUS | 6 refills | Status: DC

## 2019-06-02 MED ORDER — GLIMEPIRIDE 2 MG PO TABS: 2.0000 mg | ORAL_TABLET | Freq: Every day | ORAL | 6 refills | Status: DC

## 2019-06-02 NOTE — Assessment & Plan Note (Addendum)
Tolerating crestor 40mg  daily - continue. Check FLP at next physical.

## 2019-06-02 NOTE — Progress Notes (Signed)
Virtual visit completed through [place]. Due to national recommendations of social distancing due to COVID-19, a virtual visit is felt to be most appropriate for this patient at this time. Reviewed limitations of a virtual visit.   Patient location: home Provider location: [place] [facility], office If any vitals were documented, they were collected by patient at home unless specified below.    BP 127/77   Pulse 81   Temp 97.8 F (36.6 C)   Ht 5' 4.5" (1.638 m)   Wt 283 lb (128.4 kg)   LMP 05/21/2014   SpO2 98%   BMI 47.83 kg/m    CC: DM f/u visit Subjective:    Patient ID: Kathleen Good, female    DOB: Mar 04, 1961, 58 y.o.   MRN: [id number]  HPI: Kathleen Good is a 58 y.o. female presenting on 06/02/2019 for Follow-up   DM - does regularly check sugars BID - 94 fasting today, 2 hr postprandial 160s. Compliant with antihyperglycemic regimen which includes: amaryl 25m 1/2 tab bid, ozempic 154mweekly. Unable to tolerate metformin. Few low sugars 58-72 (a couple times). Denies paresthesias. Last diabetic eye exam DUE. Pneumovax: 08/2013. Prevnar: not due. Glucometer brand: onetouch. DSME: declined. Lab Results  Component Value Date   HGBA1C 9.6 (A) 02/13/2019   Diabetic Foot Exam - Simple   No data filed     Lab Results  Component Value Date   MICROALBUR 11.3 (H) 07/23/2018     HLD - tolerating crestor 4023maily without myalgias.   Cough present for last 3 wks. No other respiratory symptoms.      Relevant past medical, surgical, family and social history reviewed and updated as indicated. Interim medical history since our last visit reviewed. Allergies and medications reviewed and updated. Outpatient Medications Prior to Visit  Medication Sig Dispense Refill  . albuterol (PROVENTIL) (2.5 MG/3ML) 0.083% nebulizer solution Take 3 mLs (2.5 mg total) by nebulization every 6 (six) hours as needed for wheezing or shortness of breath. 150 mL 1  . aspirin EC 81 MG tablet  Take 81 mg by mouth daily with supper.    . baclofen (LIORESAL) 10 MG tablet Take 1 tablet by mouth 3 (three) times daily as needed.    . Blood Glucose Monitoring Suppl KIT Use to check sugar once daily 2 hours after a meal. Dx: E11.65 **Dispense per insurance guidelines** 1 each 0  . buPROPion (WELLBUTRIN SR) 150 MG 12 hr tablet Take 1 tablet (150 mg total) by mouth 2 (two) times daily. 180 tablet 3  . Cholecalciferol (VITAMIN D3) 25 MCG (1000 UT) CAPS Take 1 capsule (1,000 Units total) by mouth daily. 30 capsule   . clobetasol ointment (TEMOVATE) 0.07.34Apply 1 application topically 2 (two) times daily. (Patient taking differently: Apply 1 application topically 2 (two) times daily. As needed) 60 g 1  . cyanocobalamin (,VITAMIN B-12,) 1000 MCG/ML injection Inject 1 mL (1,000 mcg total) into the muscle every 30 (thirty) days. 3 mL 3  . Cyanocobalamin (B-12) 1000 MCG SUBL Place 1 tablet under the tongue daily. 30 each   . diltiazem (TIAZAC) 180 MG 24 hr capsule TAKE ONE CAPSULE BY MOUTH DAILY 30 capsule 6  . diphenhydramine-acetaminophen (TYLENOL PM) 25-500 MG TABS tablet Take 2 tablets by mouth at bedtime as needed (pain).    . gMarland Kitchenucose blood (ONE TOUCH ULTRA TEST) test strip Use to check sugars three times daily and as needed. Dispense based on insurance preference. Dx:E11.65 300 each 3  .  hydrochlorothiazide (HYDRODIURIL) 25 MG tablet TAKE ONE TABLET BY MOUTH DAILY AS NEEDED FOR SWELLING 30 tablet 1  . ibuprofen (ADVIL,MOTRIN) 200 MG tablet Take 400 mg by mouth every 6 (six) hours as needed for headache (pain).    . Melatonin 5 MG TABS Take 1 tablet by mouth at bedtime as needed.    . metoprolol succinate (TOPROL-XL) 100 MG 24 hr tablet Take 1 tablet (100 mg total) by mouth 2 (two) times daily. Take with or immediately following a meal. 180 tablet 3  . Needle, Disp, 18G X 1.25" MISC 1 Units by Does not apply route every 30 (thirty) days. For b12 injection 10 each 1  . NEEDLE, DISP, 25 G 25G X 1"  MISC Use as directed - 1 needle monthly for b12 shot administration 10 each 1  . omeprazole (PRILOSEC) 40 MG capsule Take 1 capsule (40 mg total) by mouth 2 (two) times daily. 180 capsule 3  . OneTouch Delica Lancets 73Z MISC Use to check sugar twice daily as needed. Dispense based on insurance preference. Dx: E11.65 180 each 3  . propranolol (INDERAL) 20 MG tablet Take 1 tablet (20 mg total) by mouth 3 (three) times daily as needed. (Patient taking differently: Take 20 mg by mouth 3 (three) times daily as needed (heart palpitations). ) 90 tablet 3  . rosuvastatin (CRESTOR) 40 MG tablet Take 1 tablet (40 mg total) by mouth daily. 90 tablet 3  . traMADol (ULTRAM) 50 MG tablet Take 1 tablet by mouth 3 (three) times daily as needed.    Marland Kitchen glimepiride (AMARYL) 4 MG tablet Take 0.5 tablets (2 mg total) by mouth 2 (two) times daily.    . Semaglutide,0.25 or 0.5MG/DOS, (OZEMPIC, 0.25 OR 0.5 MG/DOSE,) 2 MG/1.5ML SOPN Inject 0.5 mg into the skin once a week.    . saxagliptin HCl (ONGLYZA) 5 MG TABS tablet Take 1 tablet (5 mg total) by mouth daily. 30 tablet 6   No facility-administered medications prior to visit.      Per HPI unless specifically indicated in ROS section below Review of Systems Objective:    BP 127/77   Pulse 81   Temp 97.8 F (36.6 C)   Ht 5' 4.5" (1.638 m)   Wt 283 lb (128.4 kg)   LMP 05/21/2014   SpO2 98%   BMI 47.83 kg/m   Wt Readings from Last 3 Encounters:  06/02/19 283 lb (128.4 kg)  02/13/19 (!) 303 lb 8 oz (137.7 kg)  07/15/18 (!) 302 lb 12 oz (137.3 kg)     Physical exam: Gen: alert, NAD, not ill appearing Pulm: speaks in complete sentences without increased work of breathing Psych: normal mood, normal thought content      Results for orders placed or performed in visit on 02/13/19  POCT glycosylated hemoglobin (Hb A1C)  Result Value Ref Range   Hemoglobin A1C 9.6 (A) 4.0 - 5.6 %   HbA1c POC (<> result, manual entry)     HbA1c, POC (prediabetic range)      HbA1c, POC (controlled diabetic range)     Assessment & Plan:   Problem List Items Addressed This Visit    Vitamin B12 deficiency    She continues b12 monthly administered at home along with oral B12 replacement.       Obesity, morbid, BMI 40.0-49.9 ([place])    Congratulated on weight loss to date - ozempic beneficial .      Relevant Medications   glimepiride (AMARYL) 2 MG tablet  Semaglutide,0.25 or 0.5MG/DOS, (OZEMPIC, 0.25 OR 0.5 MG/DOSE,) 2 MG/1.5ML SOPN   Dyslipidemia associated with type 2 diabetes mellitus ([place])    Tolerating crestor 35m daily - continue. Check FLP at next physical.       Relevant Medications   glimepiride (AMARYL) 2 MG tablet   Semaglutide,0.25 or 0.5MG/DOS, (OZEMPIC, 0.25 OR 0.5 MG/DOSE,) 2 MG/1.5ML SOPN   Controlled diabetes mellitus type 2 with complications [place]    Chronic, anticipate marked improvement based on recall cbg's. She has had 20 lb weight loss since starting ozempic! Notes improved satiety and decreased appetite, decreased sugar cravings. She is very happy with results - will continue. Return at end of month for POC A1c. RTC 3 mo CPE. Encouraged she schedule eye exam.       Relevant Medications   glimepiride (AMARYL) 2 MG tablet   Semaglutide,0.25 or 0.5MG/DOS, (OZEMPIC, 0.25 OR 0.5 MG/DOSE,) 2 MG/1.5ML SOPN       Meds ordered this encounter  Medications  . glimepiride (AMARYL) 2 MG tablet    Sig: Take 1 tablet (2 mg total) by mouth daily with breakfast.    Dispense:  30 tablet    Refill:  6    Note new sig - hold until next refill due  . Semaglutide,0.25 or 0.5MG/DOS, (OZEMPIC, 0.25 OR 0.5 MG/DOSE,) 2 MG/1.5ML SOPN    Sig: Inject 0.5 mg into the skin once a week.    Dispense:  1 pen    Refill:  6   No orders of the defined types were placed in this encounter.   I discussed the assessment and treatment plan with the patient. The patient was provided an opportunity to ask questions and all were answered. The patient  agreed with the plan and demonstrated an understanding of the instructions. The patient was advised to call back or seek an in-person evaluation if the symptoms worsen or if the condition fails to improve as anticipated.  Follow up plan: No follow-ups on file.  JRia Bush MD

## 2019-06-02 NOTE — Assessment & Plan Note (Signed)
She continues b12 monthly administered at home along with oral B12 replacement.

## 2019-06-02 NOTE — Assessment & Plan Note (Signed)
Congratulated on weight loss to date - ozempic beneficial .

## 2019-06-02 NOTE — Assessment & Plan Note (Addendum)
Chronic, anticipate marked improvement based on recall cbg's. She has had 20 lb weight loss since starting ozempic! Notes improved satiety and decreased appetite, decreased sugar cravings. She is very happy with results - will continue. Return at end of month for POC A1c. RTC 3 mo CPE. Encouraged she schedule eye exam.

## 2019-06-11 ENCOUNTER — Encounter: Payer: Self-pay | Admitting: Family Medicine

## 2019-06-12 MED ORDER — SULFAMETHOXAZOLE-TRIMETHOPRIM 800-160 MG PO TABS: 1.0000 | ORAL_TABLET | Freq: Two times a day (BID) | ORAL | 0 refills | Status: DC

## 2019-06-12 NOTE — Telephone Encounter (Signed)
I'm just getting to this message now.  plz call on Monday to schedule lab visit for UA drop off.

## 2019-06-13 NOTE — Telephone Encounter (Signed)
Left message on VM advising pt I will call her back about 10 or 1030 to see if she can do a Urine specimen for [place].

## 2019-06-13 NOTE — Telephone Encounter (Signed)
Spoke to pt. She said she has a Migraine and has no one to bring her this morning. I am going to leave her a cup and wipes outside at the picnic table. She will get the UA collected and start the Bactrim today.

## 2019-06-13 NOTE — Telephone Encounter (Signed)
Can we ask pt to drop off urine specimen today as i'm at [facility]? Thanks

## 2019-06-15 ENCOUNTER — Other Ambulatory Visit (INDEPENDENT_AMBULATORY_CARE_PROVIDER_SITE_OTHER): Payer: 59 | Admitting: Family Medicine

## 2019-06-15 ENCOUNTER — Other Ambulatory Visit (INDEPENDENT_AMBULATORY_CARE_PROVIDER_SITE_OTHER): Payer: 59

## 2019-06-15 ENCOUNTER — Other Ambulatory Visit: Payer: Self-pay

## 2019-06-15 DIAGNOSIS — E118 Type 2 diabetes mellitus with unspecified complications: Secondary | ICD-10-CM

## 2019-06-15 DIAGNOSIS — E785 Hyperlipidemia, unspecified: Secondary | ICD-10-CM

## 2019-06-15 DIAGNOSIS — R300 Dysuria: Secondary | ICD-10-CM | POA: Diagnosis not present

## 2019-06-15 DIAGNOSIS — E559 Vitamin D deficiency, unspecified: Secondary | ICD-10-CM

## 2019-06-15 DIAGNOSIS — E538 Deficiency of other specified B group vitamins: Secondary | ICD-10-CM | POA: Diagnosis not present

## 2019-06-15 DIAGNOSIS — D649 Anemia, unspecified: Secondary | ICD-10-CM

## 2019-06-15 DIAGNOSIS — E1169 Type 2 diabetes mellitus with other specified complication: Secondary | ICD-10-CM | POA: Diagnosis not present

## 2019-06-15 LAB — CBC WITH DIFFERENTIAL/PLATELET
Basophils Absolute: 0.1 10*3/uL (ref 0.0–0.1)
Basophils Relative: 0.8 % (ref 0.0–3.0)
Eosinophils Absolute: 0.2 10*3/uL (ref 0.0–0.7)
Eosinophils Relative: 2.8 % (ref 0.0–5.0)
HCT: 42.6 % (ref 36.0–46.0)
Hemoglobin: 14.1 g/dL (ref 12.0–15.0)
Lymphocytes Relative: 35.8 % (ref 12.0–46.0)
Lymphs Abs: 3.2 10*3/uL (ref 0.7–4.0)
MCHC: 33 g/dL (ref 30.0–36.0)
MCV: 91.2 fl (ref 78.0–100.0)
Monocytes Absolute: 1 10*3/uL (ref 0.1–1.0)
Monocytes Relative: 11 % (ref 3.0–12.0)
Neutro Abs: 4.4 10*3/uL (ref 1.4–7.7)
Neutrophils Relative %: 49.6 % (ref 43.0–77.0)
Platelets: 258 10*3/uL (ref 150.0–400.0)
RBC: 4.67 Mil/uL (ref 3.87–5.11)
RDW: 14.5 % (ref 11.5–15.5)
WBC: 8.9 10*3/uL (ref 4.0–10.5)

## 2019-06-15 LAB — HEMOGLOBIN A1C: Hgb A1c MFr Bld: 6.5 % (ref 4.6–6.5)

## 2019-06-15 LAB — MICROALBUMIN / CREATININE URINE RATIO
Creatinine,U: 184.9 mg/dL
Microalb Creat Ratio: 37.2 mg/g — ABNORMAL HIGH (ref 0.0–30.0)
Microalb, Ur: 68.8 mg/dL — ABNORMAL HIGH (ref 0.0–1.9)

## 2019-06-15 LAB — COMPREHENSIVE METABOLIC PANEL
ALT: 15 U/L (ref 0–35)
AST: 16 U/L (ref 0–37)
Albumin: 4.1 g/dL (ref 3.5–5.2)
Alkaline Phosphatase: 86 U/L (ref 39–117)
BUN: 12 mg/dL (ref 6–23)
CO2: 25 mEq/L (ref 19–32)
Calcium: 8.8 mg/dL (ref 8.4–10.5)
Chloride: 108 mEq/L (ref 96–112)
Creatinine, Ser: 0.98 mg/dL (ref 0.40–1.20)
GFR: 58.61 mL/min — ABNORMAL LOW (ref >60.00)
Glucose, Bld: 65 mg/dL — ABNORMAL LOW (ref 70–99)
Potassium: 4.3 mEq/L (ref 3.5–5.1)
Sodium: 141 mEq/L (ref 135–145)
Total Bilirubin: 0.3 mg/dL (ref 0.2–1.2)
Total Protein: 7 g/dL (ref 6.0–8.3)

## 2019-06-15 LAB — LIPID PANEL
Cholesterol: 136 mg/dL (ref 0–200)
HDL: 39.3 mg/dL (ref >39.00)
LDL Cholesterol: 74 mg/dL (ref 0–99)
NonHDL: 97
Total CHOL/HDL Ratio: 3
Triglycerides: 116 mg/dL (ref 0.0–149.0)
VLDL: 23.2 mg/dL (ref 0.0–40.0)

## 2019-06-15 LAB — VITAMIN B12: Vitamin B-12: 496 pg/mL (ref 211–911)

## 2019-06-15 LAB — VITAMIN D 25 HYDROXY (VIT D DEFICIENCY, FRACTURES): VITD: 29.6 ng/mL — ABNORMAL LOW (ref 30.00–100.00)

## 2019-06-15 NOTE — Telephone Encounter (Signed)
Pt dropped off sample today.  UA and UCX have been ordered.

## 2019-06-16 LAB — URINALYSIS, ROUTINE W REFLEX MICROSCOPIC
Ketones, ur: NEGATIVE
Nitrite: NEGATIVE
Specific Gravity, Urine: >1.03 — AB (ref 1.000–1.030)
Total Protein, Urine: 100 — AB
Urine Glucose: NEGATIVE
Urobilinogen, UA: 0.2 — AB (ref 0.0–1.0)
pH: 6 (ref 5.0–8.0)

## 2019-06-17 LAB — URINE CULTURE
MICRO NUMBER:: 1217812
SPECIMEN QUALITY:: ADEQUATE

## 2019-06-18 ENCOUNTER — Other Ambulatory Visit (HOSPITAL_COMMUNITY): Payer: 59

## 2019-06-22 ENCOUNTER — Other Ambulatory Visit: Payer: Self-pay

## 2019-06-22 ENCOUNTER — Other Ambulatory Visit: Payer: 59

## 2019-06-23 ENCOUNTER — Ambulatory Visit (HOSPITAL_COMMUNITY): Admit: 2019-06-23 | Payer: 59 | Admitting: Internal Medicine

## 2019-06-23 ENCOUNTER — Encounter (HOSPITAL_COMMUNITY): Payer: Self-pay

## 2019-06-23 SURGERY — COLONOSCOPY WITH PROPOFOL
Anesthesia: Monitor Anesthesia Care

## 2019-08-25 ENCOUNTER — Other Ambulatory Visit: Payer: Self-pay | Admitting: Family Medicine

## 2019-09-21 ENCOUNTER — Encounter: Payer: Self-pay | Admitting: Family Medicine

## 2019-09-21 ENCOUNTER — Ambulatory Visit (INDEPENDENT_AMBULATORY_CARE_PROVIDER_SITE_OTHER): Payer: 59 | Admitting: Family Medicine

## 2019-09-21 ENCOUNTER — Other Ambulatory Visit: Payer: Self-pay

## 2019-09-21 VITALS — BP 118/78 | HR 80 | Temp 97.9°F | Ht 64.5 in | Wt 280.2 lb

## 2019-09-21 DIAGNOSIS — K219 Gastro-esophageal reflux disease without esophagitis: Secondary | ICD-10-CM

## 2019-09-21 DIAGNOSIS — Z9689 Presence of other specified functional implants: Secondary | ICD-10-CM

## 2019-09-21 DIAGNOSIS — D649 Anemia, unspecified: Secondary | ICD-10-CM

## 2019-09-21 DIAGNOSIS — I10 Essential (primary) hypertension: Secondary | ICD-10-CM

## 2019-09-21 DIAGNOSIS — E1169 Type 2 diabetes mellitus with other specified complication: Secondary | ICD-10-CM | POA: Diagnosis not present

## 2019-09-21 DIAGNOSIS — F331 Major depressive disorder, recurrent, moderate: Secondary | ICD-10-CM | POA: Diagnosis not present

## 2019-09-21 DIAGNOSIS — E785 Hyperlipidemia, unspecified: Secondary | ICD-10-CM

## 2019-09-21 DIAGNOSIS — E538 Deficiency of other specified B group vitamins: Secondary | ICD-10-CM

## 2019-09-21 DIAGNOSIS — G8929 Other chronic pain: Secondary | ICD-10-CM

## 2019-09-21 DIAGNOSIS — M545 Low back pain, unspecified: Secondary | ICD-10-CM

## 2019-09-21 DIAGNOSIS — E118 Type 2 diabetes mellitus with unspecified complications: Secondary | ICD-10-CM | POA: Diagnosis not present

## 2019-09-21 DIAGNOSIS — Z0000 Encounter for general adult medical examination without abnormal findings: Secondary | ICD-10-CM

## 2019-09-21 DIAGNOSIS — Z86010 Personal history of colonic polyps: Secondary | ICD-10-CM

## 2019-09-21 DIAGNOSIS — M797 Fibromyalgia: Secondary | ICD-10-CM

## 2019-09-21 DIAGNOSIS — E559 Vitamin D deficiency, unspecified: Secondary | ICD-10-CM

## 2019-09-21 DIAGNOSIS — Z87891 Personal history of nicotine dependence: Secondary | ICD-10-CM

## 2019-09-21 DIAGNOSIS — R011 Cardiac murmur, unspecified: Secondary | ICD-10-CM

## 2019-09-21 LAB — COMPREHENSIVE METABOLIC PANEL
ALT: 17 U/L (ref 0–35)
AST: 17 U/L (ref 0–37)
Albumin: 4.1 g/dL (ref 3.5–5.2)
Alkaline Phosphatase: 87 U/L (ref 39–117)
BUN: 15 mg/dL (ref 6–23)
CO2: 22 mEq/L (ref 19–32)
Calcium: 8.9 mg/dL (ref 8.4–10.5)
Chloride: 108 mEq/L (ref 96–112)
Creatinine, Ser: 0.92 mg/dL (ref 0.40–1.20)
GFR: 62.98 mL/min (ref >60.00)
Glucose, Bld: 100 mg/dL — ABNORMAL HIGH (ref 70–99)
Potassium: 4 mEq/L (ref 3.5–5.1)
Sodium: 141 mEq/L (ref 135–145)
Total Bilirubin: 0.4 mg/dL (ref 0.2–1.2)
Total Protein: 6.9 g/dL (ref 6.0–8.3)

## 2019-09-21 LAB — CBC WITH DIFFERENTIAL/PLATELET
Basophils Absolute: 0.1 10*3/uL (ref 0.0–0.1)
Basophils Relative: 0.9 % (ref 0.0–3.0)
Eosinophils Absolute: 0.3 10*3/uL (ref 0.0–0.7)
Eosinophils Relative: 2.9 % (ref 0.0–5.0)
HCT: 39.9 % (ref 36.0–46.0)
Hemoglobin: 13.7 g/dL (ref 12.0–15.0)
Lymphocytes Relative: 28.8 % (ref 12.0–46.0)
Lymphs Abs: 2.6 10*3/uL (ref 0.7–4.0)
MCHC: 34.2 g/dL (ref 30.0–36.0)
MCV: 90.4 fl (ref 78.0–100.0)
Monocytes Absolute: 0.7 10*3/uL (ref 0.1–1.0)
Monocytes Relative: 7.6 % (ref 3.0–12.0)
Neutro Abs: 5.4 10*3/uL (ref 1.4–7.7)
Neutrophils Relative %: 59.8 % (ref 43.0–77.0)
Platelets: 249 10*3/uL (ref 150.0–400.0)
RBC: 4.42 Mil/uL (ref 3.87–5.11)
RDW: 14.2 % (ref 11.5–15.5)
WBC: 9 10*3/uL (ref 4.0–10.5)

## 2019-09-21 LAB — VITAMIN B12: Vitamin B-12: 483 pg/mL (ref 211–911)

## 2019-09-21 LAB — LIPID PANEL
Cholesterol: 121 mg/dL (ref 0–200)
HDL: 35.5 mg/dL — ABNORMAL LOW (ref >39.00)
LDL Cholesterol: 64 mg/dL (ref 0–99)
NonHDL: 85.71
Total CHOL/HDL Ratio: 3
Triglycerides: 108 mg/dL (ref 0.0–149.0)
VLDL: 21.6 mg/dL (ref 0.0–40.0)

## 2019-09-21 LAB — TSH: TSH: 1.86 u[IU]/mL (ref 0.35–4.50)

## 2019-09-21 LAB — MICROALBUMIN / CREATININE URINE RATIO
Creatinine,U: 78.5 mg/dL
Microalb Creat Ratio: 16.1 mg/g (ref 0.0–30.0)
Microalb, Ur: 12.6 mg/dL — ABNORMAL HIGH (ref 0.0–1.9)

## 2019-09-21 LAB — VITAMIN D 25 HYDROXY (VIT D DEFICIENCY, FRACTURES): VITD: 28.96 ng/mL — ABNORMAL LOW (ref 30.00–100.00)

## 2019-09-21 LAB — HEMOGLOBIN A1C: Hgb A1c MFr Bld: 6.5 % (ref 4.6–6.5)

## 2019-09-21 MED ORDER — GLIMEPIRIDE 1 MG PO TABS: 1.0000 mg | ORAL_TABLET | Freq: Every day | ORAL | 6 refills | Status: DC

## 2019-09-21 MED ORDER — DILTIAZEM HCL ER BEADS 180 MG PO CP24: 180.0000 mg | ORAL_CAPSULE | Freq: Every day | ORAL | 11 refills | Status: DC

## 2019-09-21 MED ORDER — OZEMPIC (0.25 OR 0.5 MG/DOSE) 2 MG/1.5ML ~~LOC~~ SOPN: 0.5000 mg | PEN_INJECTOR | SUBCUTANEOUS | 6 refills | Status: DC

## 2019-09-21 NOTE — Assessment & Plan Note (Signed)
Congratulated on weight loss to date, encouraged ongoing weight loss efforts.

## 2019-09-21 NOTE — Assessment & Plan Note (Addendum)
Update levels on vit D 1000 IU daily.  

## 2019-09-21 NOTE — Assessment & Plan Note (Signed)
Chronic, longstanding - has been taking omeprazole 40mg  bid. No prior EGD. Discussed risks of long term PPI use - suggested backing down to 40mg  once daily, update with effect.

## 2019-09-21 NOTE — Assessment & Plan Note (Signed)
Chronic, stable. Continue current regimen. 

## 2019-09-21 NOTE — Assessment & Plan Note (Addendum)
Update level on B12 replacement (IM and oral).  Last B12 shot was 3 wks ago.

## 2019-09-21 NOTE — Assessment & Plan Note (Signed)
Quit 2014. 15 PY hx.

## 2019-09-21 NOTE — Progress Notes (Signed)
This visit was conducted in person.  BP 118/78 (BP Location: Left Arm, Patient Position: Sitting, Cuff Size: Large)   Pulse 80   Temp 97.9 F (36.6 C) (Temporal)   Ht 5' 4.5" (1.638 m)   Wt 280 lb 4 oz (127.1 kg)   LMP 05/21/2014   SpO2 96%   BMI 47.36 kg/m    CC: CPE Subjective:    Patient ID: Kathleen Good, female    DOB: 1960-07-26, 59 y.o.   MRN: [id number]  HPI: Kathleen Good is a 59 y.o. female presenting on 09/21/2019 for Annual Exam   Stressful period - friend passed away from kidney cancer 06/2019. Mother just diagnosed with breast cancer. Father may have bladder cancer.   Chronic lower back pain s/p permanent implantation of dorsal column stimulator by Dr Rolena Infante (01/2017). Ongoing pain managed with TENS and aleve.   Due for labs today. Did have some cream and sugar this morning at 6am.  Ongoing weight loss with ozempic.  Notes some swelling to hands and feet since Friday.  GERD - on omeprazole 54m bid. Rare dysphagia. No early satiety.  No recent need for propranolol (for tachycardia).  Lab Results  Component Value Date   HGBA1C 6.5 06/15/2019    Preventative: COLONOSCOPY Date: 12/11/2011 hyperplastic polyp, rpt 5 yrs given fmhx (Gatha Mayer. Needs to reschedule.  Well woman exam done with PCP - last 10/2017 - normal pap smear.  LMP 04/2014. Lung cancer screening - not eligible. Breast cancer screening - last mammogram 04/2012 normal. Due for rpt.Does breast exams at home. She can call breast cancer for this.  Flu - yearly COVID - looking for a place to get it.  Pneumovax 2015  Tdap - 11/2010 shingrix - completed 06/2019 Advanced directive - does not have at home. Would want husband to be HCPOA.  Seat belt use discussed Sunscreen use discussed. No suspicious moles. Ex smoker - quit 2014, 15 PY history.  Alcohol - rare Dentist - last seen 1 yr ago Eye exam - overdue   Lives with husband and cats and dogs Occupation: housewife Activity: walk dogs  but limited byback pain Diet: good water, some fruits/vegetables      Relevant past medical, surgical, family and social history reviewed and updated as indicated. Interim medical history since our last visit reviewed. Allergies and medications reviewed and updated. Outpatient Medications Prior to Visit  Medication Sig Dispense Refill  . albuterol (PROVENTIL) (2.5 MG/3ML) 0.083% nebulizer solution Take 3 mLs (2.5 mg total) by nebulization every 6 (six) hours as needed for wheezing or shortness of breath. 150 mL 1  . aspirin EC 81 MG tablet Take 81 mg by mouth daily with supper.    . baclofen (LIORESAL) 10 MG tablet Take 1 tablet by mouth 3 (three) times daily as needed.    . Blood Glucose Monitoring Suppl KIT Use to check sugar once daily 2 hours after a meal. Dx: E11.65 **Dispense per insurance guidelines** 1 each 0  . buPROPion (WELLBUTRIN SR) 150 MG 12 hr tablet Take 1 tablet (150 mg total) by mouth 2 (two) times daily. 180 tablet 3  . Cholecalciferol (VITAMIN D3) 25 MCG (1000 UT) CAPS Take 1 capsule (1,000 Units total) by mouth daily. 30 capsule   . clobetasol ointment (TEMOVATE) 03.33% Apply 1 application topically 2 (two) times daily. (Patient taking differently: Apply 1 application topically 2 (two) times daily. As needed) 60 g 1  . cyanocobalamin (,VITAMIN B-12,) 1000 MCG/ML injection INJECT  1 ML INTO THE MUSCLE ONCE MONTHLY 3 mL 3  . Cyanocobalamin (B-12) 1000 MCG SUBL Place 1 tablet under the tongue daily. 30 each   . diphenhydramine-acetaminophen (TYLENOL PM) 25-500 MG TABS tablet Take 2 tablets by mouth at bedtime as needed (pain).    Marland Kitchen glucose blood (ONE TOUCH ULTRA TEST) test strip Use to check sugars three times daily and as needed. Dispense based on insurance preference. Dx:E11.65 300 each 3  . hydrochlorothiazide (HYDRODIURIL) 25 MG tablet TAKE ONE TABLET BY MOUTH DAILY AS NEEDED FOR SWELLING 30 tablet 1  . Melatonin 5 MG TABS Take 1 tablet by mouth at bedtime as needed.    .  metoprolol succinate (TOPROL-XL) 100 MG 24 hr tablet Take 1 tablet (100 mg total) by mouth 2 (two) times daily. Take with or immediately following a meal. 180 tablet 3  . naproxen sodium (ALEVE) 220 MG tablet Take 220 mg by mouth daily as needed. Takes 2 tablets    . Needle, Disp, 18G X 1.25" MISC 1 Units by Does not apply route every 30 (thirty) days. For b12 injection 10 each 1  . NEEDLE, DISP, 25 G 25G X 1" MISC Use as directed - 1 needle monthly for b12 shot administration 10 each 1  . omeprazole (PRILOSEC) 40 MG capsule Take 1 capsule (40 mg total) by mouth 2 (two) times daily. 180 capsule 3  . OneTouch Delica Lancets 65K MISC Use to check sugar twice daily as needed. Dispense based on insurance preference. Dx: E11.65 180 each 3  . propranolol (INDERAL) 20 MG tablet Take 1 tablet (20 mg total) by mouth 3 (three) times daily as needed. (Patient taking differently: Take 20 mg by mouth 3 (three) times daily as needed (heart palpitations). ) 90 tablet 3  . rosuvastatin (CRESTOR) 40 MG tablet Take 1 tablet (40 mg total) by mouth daily. 90 tablet 3  . traMADol (ULTRAM) 50 MG tablet Take 1 tablet by mouth 3 (three) times daily as needed.    . diltiazem (TIAZAC) 180 MG 24 hr capsule TAKE ONE CAPSULE BY MOUTH DAILY 30 capsule 6  . glimepiride (AMARYL) 2 MG tablet Take 1 tablet (2 mg total) by mouth daily with breakfast. 30 tablet 6  . ibuprofen (ADVIL,MOTRIN) 200 MG tablet Take 400 mg by mouth every 6 (six) hours as needed for headache (pain).    . Semaglutide,0.25 or 0.'5MG'$ /DOS, (OZEMPIC, 0.25 OR 0.5 MG/DOSE,) 2 MG/1.5ML SOPN Inject 0.5 mg into the skin once a week. 1 pen 6  . sulfamethoxazole-trimethoprim (BACTRIM DS) 800-160 MG tablet Take 1 tablet by mouth 2 (two) times daily. 6 tablet 0   No facility-administered medications prior to visit.     Per HPI unless specifically indicated in ROS section below Review of Systems  Constitutional: Negative for activity change, appetite change, chills,  fatigue, fever and unexpected weight change.  HENT: Negative for hearing loss.   Eyes: Negative for visual disturbance.  Respiratory: Negative for cough, chest tightness, shortness of breath and wheezing.   Cardiovascular: Positive for leg swelling. Negative for chest pain and palpitations.  Gastrointestinal: Positive for constipation (managed with stool softener). Negative for abdominal distention, abdominal pain, blood in stool, diarrhea, nausea and vomiting.  Genitourinary: Negative for difficulty urinating and hematuria.  Musculoskeletal: Negative for arthralgias, myalgias and neck pain.  Skin: Negative for rash.  Neurological: Positive for headaches. Negative for dizziness, seizures and syncope.  Hematological: Negative for adenopathy. Does not bruise/bleed easily.  Psychiatric/Behavioral: Negative for dysphoric mood. The  patient is not nervous/anxious.   Notes tender spot on right sole - likely plantar's wart Objective:    BP 118/78 (BP Location: Left Arm, Patient Position: Sitting, Cuff Size: Large)   Pulse 80   Temp 97.9 F (36.6 C) (Temporal)   Ht 5' 4.5" (1.638 m)   Wt 280 lb 4 oz (127.1 kg)   LMP 05/21/2014   SpO2 96%   BMI 47.36 kg/m   Wt Readings from Last 3 Encounters:  09/21/19 280 lb 4 oz (127.1 kg)  06/02/19 283 lb (128.4 kg)  02/13/19 (!) 303 lb 8 oz (137.7 kg)    Physical Exam Vitals and nursing note reviewed.  Constitutional:      General: She is not in acute distress.    Appearance: Normal appearance. She is well-developed. She is obese. She is not ill-appearing.  HENT:     Head: Normocephalic and atraumatic.     Right Ear: Hearing, tympanic membrane, ear canal and external ear normal.     Left Ear: Hearing, tympanic membrane, ear canal and external ear normal.     Mouth/Throat:     Pharynx: Uvula midline.  Eyes:     General: No scleral icterus.    Extraocular Movements: Extraocular movements intact.     Conjunctiva/sclera: Conjunctivae normal.      Pupils: Pupils are equal, round, and reactive to light.  Cardiovascular:     Rate and Rhythm: Normal rate and regular rhythm.     Pulses: Normal pulses.          Radial pulses are 2+ on the right side and 2+ on the left side.     Heart sounds: Normal heart sounds. No murmur.  Pulmonary:     Effort: Pulmonary effort is normal. No respiratory distress.     Breath sounds: Normal breath sounds. No wheezing, rhonchi or rales.  Abdominal:     General: Abdomen is flat. Bowel sounds are normal. There is no distension.     Palpations: Abdomen is soft. There is no mass.     Tenderness: There is no abdominal tenderness. There is no guarding or rebound.     Hernia: No hernia is present.  Musculoskeletal:        General: Normal range of motion.     Cervical back: Normal range of motion and neck supple.     Right lower leg: No edema.     Left lower leg: No edema.  Lymphadenopathy:     Cervical: No cervical adenopathy.  Skin:    General: Skin is warm and dry.     Findings: No rash.     Comments: Tender slightly hyperkeratotic spot R lateral mid sole  Neurological:     General: No focal deficit present.     Mental Status: She is alert and oriented to person, place, and time.     Comments: CN grossly intact, station and gait intact  Psychiatric:        Mood and Affect: Mood normal.        Behavior: Behavior normal.        Thought Content: Thought content normal.        Judgment: Judgment normal.        Depression screen [facility] 2/9 09/21/2019 06/02/2019 11/20/2017 11/20/2017 01/04/2017  Decreased Interest 0 0 1 0 0  Down, Depressed, Hopeless 1 0 3 - 0  PHQ - 2 Score 1 0 4 0 0  Altered sleeping _0 - -  Tired, decreased energy 2  3 3 - -  Change in appetite 1 0 3 - -  Feeling bad or failure about yourself  0 0 1 - -  Trouble concentrating 0 0 0 - -  Moving slowly or fidgety/restless 0 0 0 - -  Suicidal thoughts 0 0 0 - -  PHQ-9 Score _0 - -  Difficult doing work/chores - - - - -    GAD  7 : Generalized Anxiety Score 09/21/2019 06/02/2019 11/20/2017  Nervous, Anxious, on Edge 1 0 1  Control/stop worrying 1 0 1  Worry too much - different things 0 0 1  Trouble relaxing 0 0 0  Restless 0 0 0  Easily annoyed or irritable _1 Afraid - awful might happen 0 0 0  Total GAD 7 Score _2 Assessment & Plan:  This visit occurred during the SARS-CoV-2 public health emergency.  Safety protocols were in place, including screening questions prior to the visit, additional usage of staff PPE, and extensive cleaning of exam room while observing appropriate contact time as indicated for disinfecting solutions.  Ongoing constipation with straining - good water intake, fruits/vegetables. PRN stool softener. Discussed PRN miralax.  Problem List Items Addressed This Visit    Vitamin D deficiency    Update levels on vit D 1000 IU daily.       Relevant Orders   VITAMIN D 25 Hydroxy (Vit-D Deficiency, Fractures)   Vitamin B12 deficiency    Update level on B12 replacement (IM and oral).  Last B12 shot was 3 wks ago.       Relevant Orders   Vitamin B12   Status post insertion of spinal cord stimulator   Personal history of colonic polyps    Overdue for colonoscopy - encouraged reschedule.       Obesity, morbid, BMI 40.0-49.9 ([place])    Congratulated on weight loss to date, encouraged ongoing weight loss efforts.      Relevant Medications   glimepiride (AMARYL) 1 MG tablet   Semaglutide,0.25 or 0.5MG/DOS, (OZEMPIC, 0.25 OR 0.5 MG/DOSE,) 2 MG/1.5ML SOPN   MURMUR    Not appreciated today.       MDD (major depressive disorder), recurrent episode, moderate (HCC)    Improved period with PHQ9 = 7 - actually desires to trial lower wellbutrin dose - will decrease to 125m SR once daily. Update with effect - if doing will, may decrease dose to 102mnext refill.      HYPERTENSION, BENIGN    Chronic, stable. Continue current regimen.       Relevant Medications   diltiazem (TIAZAC) 180  MG 24 hr capsule   Healthcare maintenance - Primary    Preventative protocols reviewed and updated unless pt declined. Discussed healthy diet and lifestyle.  Overdue for health maintenance - encouraged she schedule this.       GERD    Chronic, longstanding - has been taking omeprazole 4028mid. No prior EGD. Discussed risks of long term PPI use - suggested backing down to 80m2mce daily, update with effect.       Fibromyalgia (Chronic)   Relevant Medications   naproxen sodium (ALEVE) 220 MG tablet   Ex-smoker    Quit 2014. 15 PY hx.      Dyslipidemia associated with type 2 diabetes mellitus (HCC)    Chronic, stable on crestor 80mg44mly. Update FLP. The 10-year ASCVD risk score (GoffMikey Bussingr., et al., 2013) is: 4.4%   Values used  to calculate the score:     Age: 19 years     Sex: Female     Is Non-Hispanic African American: No     Diabetic: Yes     Tobacco smoker: No     Systolic Blood Pressure: 749 mmHg     Is BP treated: Yes     HDL Cholesterol: 39.3 mg/dL     Total Cholesterol: 136 mg/dL       Relevant Medications   glimepiride (AMARYL) 1 MG tablet   Semaglutide,0.25 or 0.5MG/DOS, (OZEMPIC, 0.25 OR 0.5 MG/DOSE,) 2 MG/1.5ML SOPN   Other Relevant Orders   Comprehensive metabolic panel   Lipid panel   TSH   Controlled diabetes mellitus type 2 with complications (HCC)    Stable period on amaryl, ozempic.  Update A1c.  Congratulated on ongoing weight loss.       Relevant Medications   glimepiride (AMARYL) 1 MG tablet   Semaglutide,0.25 or 0.5MG/DOS, (OZEMPIC, 0.25 OR 0.5 MG/DOSE,) 2 MG/1.5ML SOPN   Other Relevant Orders   Hemoglobin A1c   Microalbumin / creatinine urine ratio   Chronic low back pain (Location of Primary Source of Pain) (Bilateral) (L>R) (Chronic)   Relevant Medications   naproxen sodium (ALEVE) 220 MG tablet   Chronic back pain   Relevant Medications   naproxen sodium (ALEVE) 220 MG tablet   Anemia   Relevant Orders   CBC with  Differential/Platelet       Meds ordered this encounter  Medications  . diltiazem (TIAZAC) 180 MG 24 hr capsule    Sig: Take 1 capsule (180 mg total) by mouth daily.    Dispense:  30 capsule    Refill:  11  . glimepiride (AMARYL) 1 MG tablet    Sig: Take 1 tablet (1 mg total) by mouth daily with breakfast.    Dispense:  30 tablet    Refill:  6  . Semaglutide,0.25 or 0.5MG/DOS, (OZEMPIC, 0.25 OR 0.5 MG/DOSE,) 2 MG/1.5ML SOPN    Sig: Inject 0.5 mg into the skin once a week.    Dispense:  1 pen    Refill:  6   Orders Placed This Encounter  Procedures  . Comprehensive metabolic panel  . Lipid panel  . Vitamin B12  . VITAMIN D 25 Hydroxy (Vit-D Deficiency, Fractures)  . Hemoglobin A1c  . CBC with Differential/Platelet  . Microalbumin / creatinine urine ratio  . TSH    Follow up plan: Return in about 6 months (around 03/23/2020) for follow up visit.  Ria Bush, MD

## 2019-09-21 NOTE — Patient Instructions (Addendum)
Labs today  Trial once a day omeprazole.  Schedule eye exam when you can.  Reschedule colonoscopy when you can.  Schedule mammogram when you can. [place] [phone].  Trial miralax 1/2 - 1 capful a day as needed for constipation.  Ok to try wellbutrin once daily. If doing well, let me know and next refill could be lower dose.  Return as needed or in 6 months for diabetes follow up.  Health Maintenance for Postmenopausal Women Menopause is a normal process in which your ability to get pregnant comes to an end. This process happens slowly over many months or years, usually between the ages of 42 and 72. Menopause is complete when you have missed your menstrual periods for 12 months. It is important to talk with your health care provider about some of the most common conditions that affect women after menopause (postmenopausal women). These include heart disease, cancer, and bone loss (osteoporosis). Adopting a healthy lifestyle and getting preventive care can help to promote your health and wellness. The actions you take can also lower your chances of developing some of these common conditions. What should I know about menopause? During menopause, you may get a number of symptoms, such as:  Hot flashes. These can be moderate or severe.  Night sweats.  Decrease in sex drive.  Mood swings.  Headaches.  Tiredness.  Irritability.  Memory problems.  Insomnia. Choosing to treat or not to treat these symptoms is a decision that you make with your health care provider. Do I need hormone replacement therapy?  Hormone replacement therapy is effective in treating symptoms that are caused by menopause, such as hot flashes and night sweats.  Hormone replacement carries certain risks, especially as you become older. If you are thinking about using estrogen or estrogen with progestin, discuss the benefits and risks with your health care provider. What is my risk for heart  disease and stroke? The risk of heart disease, heart attack, and stroke increases as you age. One of the causes may be a change in the body's hormones during menopause. This can affect how your body uses dietary fats, triglycerides, and cholesterol. Heart attack and stroke are medical emergencies. There are many things that you can do to help prevent heart disease and stroke. Watch your blood pressure  High blood pressure causes heart disease and increases the risk of stroke. This is more likely to develop in people who have high blood pressure readings, are of African descent, or are overweight.  Have your blood pressure checked: ? Every 3-5 years if you are 81-82 years of age. ? Every year if you are 40 years old or older. Eat a healthy diet   Eat a diet that includes plenty of vegetables, fruits, low-fat dairy products, and lean protein.  Do not eat a lot of foods that are high in solid fats, added sugars, or sodium. Get regular exercise Get regular exercise. This is one of the most important things you can do for your health. Most adults should:  Try to exercise for at least 150 minutes each week. The exercise should increase your heart rate and make you sweat (moderate-intensity exercise).  Try to do strengthening exercises at least twice each week. Do these in addition to the moderate-intensity exercise.  Spend less time sitting. Even light physical activity can be beneficial. Other tips  Work with your health care provider to achieve or maintain a healthy weight.  Do not use any products that contain  nicotine or tobacco, such as cigarettes, e-cigarettes, and chewing tobacco. If you need help quitting, ask your health care provider.  Know your numbers. Ask your health care provider to check your cholesterol and your blood sugar (glucose). Continue to have your blood tested as directed by your health care provider. Do I need screening for cancer? Depending on your health history  and family history, you may need to have cancer screening at different stages of your life. This may include screening for:  Breast cancer.  Cervical cancer.  Lung cancer.  Colorectal cancer. What is my risk for osteoporosis? After menopause, you may be at increased risk for osteoporosis. Osteoporosis is a condition in which bone destruction happens more quickly than new bone creation. To help prevent osteoporosis or the bone fractures that can happen because of osteoporosis, you may take the following actions:  If you are 20-64 years old, get at least 1,000 mg of calcium and at least 600 mg of vitamin D per day.  If you are older than age 49 but younger than age 71, get at least 1,200 mg of calcium and at least 600 mg of vitamin D per day.  If you are older than age 91, get at least 1,200 mg of calcium and at least 800 mg of vitamin D per day. Smoking and drinking excessive alcohol increase the risk of osteoporosis. Eat foods that are rich in calcium and vitamin D, and do weight-bearing exercises several times each week as directed by your health care provider. How does menopause affect my mental health? Depression may occur at any age, but it is more common as you become older. Common symptoms of depression include:  Low or sad mood.  Changes in sleep patterns.  Changes in appetite or eating patterns.  Feeling an overall lack of motivation or enjoyment of activities that you previously enjoyed.  Frequent crying spells. Talk with your health care provider if you think that you are experiencing depression. General instructions See your health care provider for regular wellness exams and vaccines. This may include:  Scheduling regular health, dental, and eye exams.  Getting and maintaining your vaccines. These include: ? Influenza vaccine. Get this vaccine each year before the flu season begins. ? Pneumonia vaccine. ? Shingles vaccine. ? Tetanus, diphtheria, and pertussis  (Tdap) booster vaccine. Your health care provider may also recommend other immunizations. Tell your health care provider if you have ever been abused or do not feel safe at home. Summary  Menopause is a normal process in which your ability to get pregnant comes to an end.  This condition causes hot flashes, night sweats, decreased interest in sex, mood swings, headaches, or lack of sleep.  Treatment for this condition may include hormone replacement therapy.  Take actions to keep yourself healthy, including exercising regularly, eating a healthy diet, watching your weight, and checking your blood pressure and blood sugar levels.  Get screened for cancer and depression. Make sure that you are up to date with all your vaccines. This information is not intended to replace advice given to you by your health care provider. Make sure you discuss any questions you have with your health care provider. Document Revised: 06/04/2018 Document Reviewed: 06/04/2018 Elsevier Patient Education  2020 [employer].

## 2019-09-21 NOTE — Assessment & Plan Note (Signed)
Stable period on amaryl, ozempic.  Update A1c.  Congratulated on ongoing weight loss.

## 2019-09-21 NOTE — Assessment & Plan Note (Signed)
Preventative protocols reviewed and updated unless pt declined. Discussed healthy diet and lifestyle.  Overdue for health maintenance - encouraged she schedule this.

## 2019-09-21 NOTE — Assessment & Plan Note (Signed)
Not appreciated today.  

## 2019-09-21 NOTE — Assessment & Plan Note (Signed)
Improved period with PHQ9 = 7 - actually desires to trial lower wellbutrin dose - will decrease to 150mg  SR once daily. Update with effect - if doing will, may decrease dose to 100mg  next refill.

## 2019-09-21 NOTE — Assessment & Plan Note (Signed)
Chronic, stable on crestor 40mg  daily. Update FLP. The 10-year ASCVD risk score Mikey Bussing DC Brooke Bonito., et al., 2013) is: 4.4%   Values used to calculate the score:     Age: 59 years     Sex: Female     Is Non-Hispanic African American: No     Diabetic: Yes     Tobacco smoker: No     Systolic Blood Pressure: 123456 mmHg     Is BP treated: Yes     HDL Cholesterol: 39.3 mg/dL     Total Cholesterol: 136 mg/dL

## 2019-09-21 NOTE — Assessment & Plan Note (Signed)
Overdue for colonoscopy - encouraged reschedule.

## 2019-09-25 ENCOUNTER — Other Ambulatory Visit: Payer: Self-pay

## 2019-09-25 ENCOUNTER — Ambulatory Visit: Payer: 59 | Attending: Internal Medicine

## 2019-09-25 DIAGNOSIS — Z23 Encounter for immunization: Secondary | ICD-10-CM

## 2019-09-25 NOTE — Progress Notes (Signed)
   Covid-19 Vaccination Clinic  Name:  LATARCHA PREAST    MRN: [id number] DOB: 1961-06-20  09/25/2019  Ms. Hunkele was observed post Covid-19 immunization for 30 minutes based on pre-vaccination screening without incident. She was provided with Vaccine Information Sheet and instruction to access the V-Safe system.   Ms. Hopler was instructed to call 911 with any severe reactions post vaccine: Marland Kitchen Difficulty breathing  . Swelling of face and throat  . A fast heartbeat  . A bad rash all over body  . Dizziness and weakness   Immunizations Administered    Name Date Dose VIS Date Route   Pfizer COVID-19 Vaccine 09/25/2019 10:42 AM 0.3 mL 06/05/2019 Intramuscular   Manufacturer: [place]   Lot: [id number]   [place]: [id number]

## 2019-10-21 ENCOUNTER — Ambulatory Visit: Payer: 59 | Attending: Internal Medicine

## 2019-10-21 DIAGNOSIS — Z23 Encounter for immunization: Secondary | ICD-10-CM

## 2019-10-21 NOTE — Progress Notes (Signed)
   Covid-19 Vaccination Clinic  Name:  Kathleen Good    MRN: [id number] DOB: 18-Feb-1961  10/21/2019  [name] was observed post Covid-19 immunization for 15 minutes without incident. She was provided with Vaccine Information Sheet and instruction to access the V-Safe system.   [name] was instructed to call 911 with any severe reactions post vaccine: Marland Kitchen Difficulty breathing  . Swelling of face and throat  . A fast heartbeat  . A bad rash all over body  . Dizziness and weakness   Immunizations Administered    Name Date Dose VIS Date Route   Pfizer COVID-19 Vaccine 10/21/2019  9:26 AM 0.3 mL 08/19/2018 Intramuscular   Manufacturer: [place]   Lot: [id number]   [place]: [id number]

## 2019-10-31 ENCOUNTER — Other Ambulatory Visit: Payer: Self-pay | Admitting: Family Medicine

## 2019-11-02 ENCOUNTER — Encounter: Payer: Self-pay | Admitting: Family Medicine

## 2019-11-04 MED ORDER — BUPROPION HCL ER (SR) 100 MG PO TB12: 100.0000 mg | ORAL_TABLET | Freq: Every day | ORAL | 1 refills | Status: DC

## 2019-11-04 NOTE — Telephone Encounter (Signed)
Ok to do Q2 wk B12 shots for 1 month then reassess energy levels.

## 2019-11-05 MED ORDER — CYANOCOBALAMIN 1000 MCG/ML IJ SOLN: INTRAMUSCULAR | 3 refills | Status: DC

## 2019-11-05 NOTE — Addendum Note (Signed)
Addended by: Brenton Grills on: [id number] AM   Modules accepted: Orders

## 2019-11-05 NOTE — Telephone Encounter (Signed)
Spoke with pt relaying Dr. Synthia Innocent message.  Verbalizes understanding and agrees to try vit B12 Q2 wks.  Requests refill.  E-scribed refill.  FYI to Dr. Darnell Level.

## 2019-11-16 LAB — HM DIABETES EYE EXAM

## 2019-11-19 ENCOUNTER — Encounter: Payer: Self-pay | Admitting: Family Medicine

## 2019-12-16 ENCOUNTER — Ambulatory Visit: Payer: 59 | Admitting: Family Medicine

## 2019-12-16 ENCOUNTER — Encounter: Payer: Self-pay | Admitting: Family Medicine

## 2019-12-16 ENCOUNTER — Other Ambulatory Visit: Payer: Self-pay

## 2019-12-16 VITALS — BP 124/66 | HR 90 | Temp 98.0°F | Ht 64.5 in | Wt 279.5 lb

## 2019-12-16 DIAGNOSIS — M67442 Ganglion, left hand: Secondary | ICD-10-CM | POA: Diagnosis not present

## 2019-12-16 DIAGNOSIS — F331 Major depressive disorder, recurrent, moderate: Secondary | ICD-10-CM

## 2019-12-16 DIAGNOSIS — M797 Fibromyalgia: Secondary | ICD-10-CM

## 2019-12-16 DIAGNOSIS — R52 Pain, unspecified: Secondary | ICD-10-CM

## 2019-12-16 LAB — SEDIMENTATION RATE: Sed Rate: 48 mm/hr — ABNORMAL HIGH (ref 0–30)

## 2019-12-16 LAB — C-REACTIVE PROTEIN: CRP: <1 mg/dL (ref 0.5–20.0)

## 2019-12-16 LAB — CK: Total CK: 119 U/L (ref 7–177)

## 2019-12-16 NOTE — Assessment & Plan Note (Signed)
Diffuse body aches for 3 weeks correlating with dropping wellbutrin dose, but unsure if related.  She is on high dose crestor - will check CPK as well as ESR/CRP to r/o inflammatory cause of worsening diffuse joint and muscle aches. No signs of inflammatory arthritis at this time.  Will need to verify no recent tick bites.

## 2019-12-16 NOTE — Assessment & Plan Note (Signed)
Discussed with patient. Has recurred x2 so far. rec voltaren gel. If not improving rec f/u with SM or ortho to consider aspiration/steroid injection vs excision.

## 2019-12-16 NOTE — Assessment & Plan Note (Signed)
Doing well on lower wellbutrin dose SR 100mg  daily - don't think this taper is related to worsening joint pains.

## 2019-12-16 NOTE — Assessment & Plan Note (Addendum)
Intolerant of cymbalta, gabapentin, lyrica.  Amitriptyline was overly sedating.
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
## 2019-12-16 NOTE — Patient Instructions (Addendum)
You have ganglion cyst of left middle finger - try voltaren topical anti inflammatory (OTC) when it gets inflamed.  Labs today. Restart co enzyme Q10 for muscle health.  Let us know dose of magnesium supplement.

## 2019-12-18 ENCOUNTER — Encounter: Payer: Self-pay | Admitting: Family Medicine

## 2019-12-21 MED ORDER — MAGNESIUM 200 MG PO TABS: 1.0000 | ORAL_TABLET | Freq: Every day | ORAL | Status: AC

## 2019-12-21 MED ORDER — CVS COQ-10 200 MG PO CAPS: 200.0000 mg | ORAL_CAPSULE | Freq: Every day | ORAL | Status: DC

## 2020-01-01 ENCOUNTER — Other Ambulatory Visit: Payer: Self-pay | Admitting: Family Medicine

## 2020-01-04 ENCOUNTER — Encounter: Payer: Self-pay | Admitting: Family Medicine

## 2020-01-08 MED ORDER — BUPROPION HCL ER (SR) 100 MG PO TB12: 100.0000 mg | ORAL_TABLET | Freq: Two times a day (BID) | ORAL | 1 refills | Status: DC

## 2020-01-23 ENCOUNTER — Other Ambulatory Visit: Payer: Self-pay | Admitting: Family Medicine

## 2020-02-08 ENCOUNTER — Emergency Department: Payer: 59

## 2020-02-08 ENCOUNTER — Other Ambulatory Visit: Payer: Self-pay

## 2020-02-08 ENCOUNTER — Emergency Department
Admission: EM | Admit: 2020-02-08 | Discharge: 2020-02-08 | Disposition: A | Discharge status: Home / Self Care | Payer: 59 | Attending: Emergency Medicine | Admitting: Emergency Medicine

## 2020-02-08 DIAGNOSIS — S8391XA Sprain of unspecified site of right knee, initial encounter: Secondary | ICD-10-CM

## 2020-02-08 DIAGNOSIS — M25561 Pain in right knee: Secondary | ICD-10-CM | POA: Diagnosis present

## 2020-02-08 DIAGNOSIS — W010XXA Fall on same level from slipping, tripping and stumbling without subsequent striking against object, initial encounter: Secondary | ICD-10-CM | POA: Diagnosis not present

## 2020-02-08 DIAGNOSIS — E119 Type 2 diabetes mellitus without complications: Secondary | ICD-10-CM | POA: Diagnosis not present

## 2020-02-08 DIAGNOSIS — Z7982 Long term (current) use of aspirin: Secondary | ICD-10-CM | POA: Diagnosis not present

## 2020-02-08 DIAGNOSIS — Z7984 Long term (current) use of oral hypoglycemic drugs: Secondary | ICD-10-CM | POA: Diagnosis not present

## 2020-02-08 DIAGNOSIS — Y9389 Activity, other specified: Secondary | ICD-10-CM | POA: Insufficient documentation

## 2020-02-08 DIAGNOSIS — Z87891 Personal history of nicotine dependence: Secondary | ICD-10-CM | POA: Insufficient documentation

## 2020-02-08 DIAGNOSIS — Y998 Other external cause status: Secondary | ICD-10-CM | POA: Insufficient documentation

## 2020-02-08 DIAGNOSIS — Y9259 Other trade areas as the place of occurrence of the external cause: Secondary | ICD-10-CM | POA: Insufficient documentation

## 2020-02-08 DIAGNOSIS — Z79899 Other long term (current) drug therapy: Secondary | ICD-10-CM | POA: Diagnosis not present

## 2020-02-08 DIAGNOSIS — I10 Essential (primary) hypertension: Secondary | ICD-10-CM | POA: Insufficient documentation

## 2020-02-08 DIAGNOSIS — Z7951 Long term (current) use of inhaled steroids: Secondary | ICD-10-CM | POA: Insufficient documentation

## 2020-02-08 MED ORDER — MELOXICAM 15 MG PO TABS: 15.0000 mg | ORAL_TABLET | Freq: Every day | ORAL | 0 refills | Status: DC

## 2020-02-08 MED ORDER — TRAMADOL HCL 50 MG PO TABS: 50.0000 mg | ORAL_TABLET | Freq: Four times a day (QID) | ORAL | 0 refills | Status: DC | PRN

## 2020-02-08 NOTE — ED Notes (Signed)
See triage note  Presents s/p fall yesterday  States she slipped getting out of tub  Having pain to left knee   Min swelling note  Unable to bear wt
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
## 2020-02-08 NOTE — ED Triage Notes (Addendum)
Pt comes via POV from home with c/o right leg swelling and pain following a mechanical fall. Pt states she just slipped getting out of tub.   Pt states some left leg pain too but right is worse and unable to bear much weight.  Pt denies LOC or hitting head.

## 2020-02-08 NOTE — Discharge Instructions (Signed)
Please follow up with orthopedics for symptoms that are not improving over the week.  Use the knee brace when out of bed.

## 2020-02-17 ENCOUNTER — Other Ambulatory Visit: Payer: Self-pay | Admitting: Family Medicine

## 2020-03-11 ENCOUNTER — Other Ambulatory Visit: Payer: Self-pay

## 2020-03-11 ENCOUNTER — Ambulatory Visit: Payer: 59 | Admitting: Family Medicine

## 2020-03-11 ENCOUNTER — Encounter: Payer: Self-pay | Admitting: Family Medicine

## 2020-03-11 VITALS — BP 120/78 | HR 88 | Temp 97.8°F | Ht 65.0 in | Wt 283.2 lb

## 2020-03-11 DIAGNOSIS — F331 Major depressive disorder, recurrent, moderate: Secondary | ICD-10-CM

## 2020-03-11 MED ORDER — TRAZODONE HCL 50 MG PO TABS: 25.0000 mg | ORAL_TABLET | Freq: Every evening | ORAL | 3 refills | Status: DC | PRN

## 2020-03-11 MED ORDER — METOPROLOL SUCCINATE ER 100 MG PO TB24: 100.0000 mg | ORAL_TABLET | Freq: Two times a day (BID) | ORAL | 3 refills | Status: DC

## 2020-03-11 MED ORDER — CLOBETASOL PROPIONATE 0.05 % EX OINT: 1.0000 "application " | TOPICAL_OINTMENT | Freq: Two times a day (BID) | CUTANEOUS | 1 refills | Status: DC

## 2020-03-11 MED ORDER — BUPROPION HCL ER (SR) 150 MG PO TB12: 150.0000 mg | ORAL_TABLET | Freq: Two times a day (BID) | ORAL | 1 refills | Status: DC

## 2020-03-11 NOTE — Patient Instructions (Addendum)
Increase wellbutrin to 150mg  twice daily.  May use trazodone 1/2-1 tablet at night time as needed for sleep. Ok to continue melatonin 5mg  as needed.  Work on healthy stress relieving strategies.

## 2020-03-11 NOTE — Progress Notes (Signed)
This visit was conducted in person.  BP 120/78 (BP Location: Left Arm, Patient Position: Sitting, Cuff Size: Large)   Pulse 88   Temp 97.8 F (36.6 C) (Temporal)   Ht 5' 5"  (1.651 m)   Wt 283 lb 4 oz (128.5 kg)   LMP 05/21/2014   SpO2 97%   BMI 47.14 kg/m    CC: discuss anxiety  Subjective:    Patient ID: Kathleen Good, female    DOB: 25-Jul-1960, 59 y.o.   MRN: [id number]  HPI: Kathleen Good is a 59 y.o. female presenting on 03/11/2020 for Mood (Wants to discuss anxiety and changes in mood med. )   See prior notes for details. Recently dropped wellbutrin SR down to 175m daily - overall stable period. However 12/2019 she restarted titration up to 1064mSR twice daily. Attributing to increased stressors (mother's breast cancer and father's hospitalization for recurrent stroke, now noticing husband memory worsening - to see neurologist). She is already plugged into some support groups for alzheimer's disease. Especially notes trouble falling asleep as well as staying very tired. Appetite - decreased. No SI/HI.   Previously on fluoxetine.  Cymbalta caused pedal edema.  Insomnia - sleep maintenance > initiation.  Increased melatonin to 1074m no benefit for sleep.   [place] [facility] [place].      Relevant past medical, surgical, family and social history reviewed and updated as indicated. Interim medical history since our last visit reviewed. Allergies and medications reviewed and updated. Outpatient Medications Prior to Visit  Medication Sig Dispense Refill  . albuterol (PROVENTIL) (2.5 MG/3ML) 0.083% nebulizer solution Take 3 mLs (2.5 mg total) by nebulization every 6 (six) hours as needed for wheezing or shortness of breath. 150 mL 1  . aspirin EC 81 MG tablet Take 81 mg by mouth daily with supper.    . baclofen (LIORESAL) 10 MG tablet Take 1 tablet by mouth 3 (three) times daily as needed.    . Blood Glucose Monitoring Suppl  KIT Use to check sugar once daily 2 hours after a meal. Dx: E11.65 **Dispense per insurance guidelines** 1 each 0  . Cholecalciferol (VITAMIN D3) 25 MCG (1000 UT) CAPS Take 1 capsule (1,000 Units total) by mouth daily. 30 capsule   . Coenzyme Q10 ([employer] COQ-10) 200 MG capsule Take 1 capsule (200 mg total) by mouth daily.    . cyanocobalamin (,VITAMIN B-12,) 1000 MCG/ML injection INJECT 1ML INTRAMUSCULARLY ONCE MONTHLY 1 mL 0  . Cyanocobalamin (B-12) 1000 MCG SUBL Place 1 tablet under the tongue daily. 30 each   . diltiazem (TIAZAC) 180 MG 24 hr capsule TAKE ONE CAPSULE BY MOUTH DAILY 90 capsule 3  . diphenhydramine-acetaminophen (TYLENOL PM) 25-500 MG TABS tablet Take 2 tablets by mouth at bedtime as needed (pain).    . gMarland Kitchenimepiride (AMARYL) 1 MG tablet Take 1 tablet (1 mg total) by mouth daily with breakfast. 30 tablet 6  . glucose blood (ONE TOUCH ULTRA TEST) test strip Use to check sugars three times daily and as needed. Dispense based on insurance preference. Dx:E11.65 300 each 3  . hydrochlorothiazide (HYDRODIURIL) 25 MG tablet TAKE ONE TABLET BY MOUTH DAILY AS NEEDED FOR SWELLING 30 tablet 1  . Magnesium 200 MG TABS Take 1 tablet (200 mg total) by mouth daily. 30 tablet   . Melatonin 5 MG TABS Take 1 tablet by mouth at bedtime as needed.    . meloxicam (MOBIC) 15 MG tablet Take  1 tablet (15 mg total) by mouth daily. 30 tablet 0  . naproxen sodium (ALEVE) 220 MG tablet Take 220 mg by mouth daily as needed. Takes 2 tablets    . Needle, Disp, 18G X 1.25" MISC 1 Units by Does not apply route every 30 (thirty) days. For b12 injection 10 each 1  . NEEDLE, DISP, 25 G 25G X 1" MISC Use as directed - 1 needle monthly for b12 shot administration 10 each 1  . omeprazole (PRILOSEC) 40 MG capsule Take 1 capsule (40 mg total) by mouth 2 (two) times daily. 180 capsule 3  . OneTouch Delica Lancets 36I MISC Use to check sugar twice daily as needed. Dispense based on insurance preference. Dx: E11.65 180 each 3  .  propranolol (INDERAL) 20 MG tablet Take 1 tablet (20 mg total) by mouth 3 (three) times daily as needed. (Patient taking differently: Take 20 mg by mouth 3 (three) times daily as needed (heart palpitations). ) 90 tablet 3  . rosuvastatin (CRESTOR) 40 MG tablet TAKE ONE TABLET BY MOUTH DAILY 90 tablet 2  . Semaglutide,0.25 or 0.5MG/DOS, (OZEMPIC, 0.25 OR 0.5 MG/DOSE,) 2 MG/1.5ML SOPN Inject 0.5 mg into the skin once a week. 1 pen 6  . traMADol (ULTRAM) 50 MG tablet Take 1 tablet (50 mg total) by mouth every 6 (six) hours as needed. 12 tablet 0  . buPROPion (WELLBUTRIN SR) 100 MG 12 hr tablet Take 1 tablet (100 mg total) by mouth 2 (two) times daily. 180 tablet 1  . clobetasol ointment (TEMOVATE) 6.80 % Apply 1 application topically 2 (two) times daily. (Patient taking differently: Apply 1 application topically 2 (two) times daily. As needed) 60 g 1  . metoprolol succinate (TOPROL-XL) 100 MG 24 hr tablet Take 1 tablet (100 mg total) by mouth 2 (two) times daily. Take with or immediately following a meal. 180 tablet 3  . glimepiride (AMARYL) 2 MG tablet TAKE ONE TABLET BY MOUTH DAILY WITH BREAKFAST 30 tablet 6   No facility-administered medications prior to visit.     Per HPI unless specifically indicated in ROS section below Review of Systems Objective:  BP 120/78 (BP Location: Left Arm, Patient Position: Sitting, Cuff Size: Large)   Pulse 88   Temp 97.8 F (36.6 C) (Temporal)   Ht 5' 5"  (1.651 m)   Wt 283 lb 4 oz (128.5 kg)   LMP 05/21/2014   SpO2 97%   BMI 47.14 kg/m   Wt Readings from Last 3 Encounters:  03/11/20 283 lb 4 oz (128.5 kg)  02/08/20 282 lb (127.9 kg)  12/16/19 279 lb 8 oz (126.8 kg)      Physical Exam Vitals and nursing note reviewed.  Constitutional:      Appearance: Normal appearance. She is not ill-appearing.  Cardiovascular:     Rate and Rhythm: Normal rate and regular rhythm.     Pulses: Normal pulses.     Heart sounds: Normal heart sounds. No murmur heard.    Pulmonary:     Effort: Pulmonary effort is normal. No respiratory distress.     Breath sounds: Normal breath sounds. No wheezing, rhonchi or rales.  Musculoskeletal:     Right lower leg: No edema.     Left lower leg: No edema.  Skin:    General: Skin is warm and dry.  Neurological:     Mental Status: She is alert.  Psychiatric:        Mood and Affect: Mood normal.  Behavior: Behavior normal.       Results for orders placed or performed in visit on 12/16/19  Sedimentation rate  Result Value Ref Range   Sed Rate 48 (H) 0 - 30 mm/hr  CK  Result Value Ref Range   Total CK 119 7.0 - 177.0 U/L  C-reactive protein  Result Value Ref Range   CRP <1.0 0.5 - 20.0 mg/dL   Depression screen [facility] 2/9 03/11/2020 09/21/2019 06/02/2019 11/20/2017 11/20/2017  Decreased Interest 1 0 0 1 0  Down, Depressed, Hopeless 1 1 0 3 -  PHQ - 2 Score 2 1 0 4 0  Altered sleeping 3 3 3 3  -  Tired, decreased energy 3 2 3 3  -  Change in appetite 3 1 0 3 -  Feeling bad or failure about yourself  0 0 0 1 -  Trouble concentrating 2 0 0 0 -  Moving slowly or fidgety/restless 0 0 0 0 -  Suicidal thoughts 0 0 0 0 -  PHQ-9 Score 13 7 6 14  -  Difficult doing work/chores - - - - -   GAD 7 : Generalized Anxiety Score 03/11/2020 09/21/2019 06/02/2019 11/20/2017  Nervous, Anxious, on Edge 3 1 0 1  Control/stop worrying 2 1 0 1  Worry too much - different things 2 0 0 1  Trouble relaxing 3 0 0 0  Restless 0 0 0 0  Easily annoyed or irritable 3 2 1 3   Afraid - awful might happen 2 0 0 0  Total GAD 7 Score 15 4 1 6    Assessment & Plan:  This visit occurred during the SARS-CoV-2 public health emergency.  Safety protocols were in place, including screening questions prior to the visit, additional usage of staff PPE, and extensive cleaning of exam room while observing appropriate contact time as indicated for disinfecting solutions.   Problem List Items Addressed This Visit    MDD (major depressive disorder),  recurrent episode, moderate ([place]) - Primary    Deterioration noted in setting of increasing family stressors. Will increase wellbutrin to 149m SR BID, will add trazodone 25-526mat night for sleep. Reviewed importance of stress relieving strategies. Update with effect.       Relevant Medications   buPROPion (WELLBUTRIN SR) 150 MG 12 hr tablet   traZODone (DESYREL) 50 MG tablet       Meds ordered this encounter  Medications  . clobetasol ointment (TEMOVATE) 0.05 %    Sig: Apply 1 application topically 2 (two) times daily. As needed    Dispense:  60 g    Refill:  1  . metoprolol succinate (TOPROL-XL) 100 MG 24 hr tablet    Sig: Take 1 tablet (100 mg total) by mouth 2 (two) times daily. Take with or immediately following a meal.    Dispense:  180 tablet    Refill:  3  . buPROPion (WELLBUTRIN SR) 150 MG 12 hr tablet    Sig: Take 1 tablet (150 mg total) by mouth 2 (two) times daily.    Dispense:  180 tablet    Refill:  1    Note new sig  . traZODone (DESYREL) 50 MG tablet    Sig: Take 0.5-1 tablets (25-50 mg total) by mouth at bedtime as needed for sleep.    Dispense:  30 tablet    Refill:  3   No orders of the defined types were placed in this encounter.   Patient Instructions  Increase wellbutrin to 15063mwice daily.  May use trazodone 1/2-1 tablet at night time as needed for sleep. Ok to continue melatonin 104m as needed.  Work on healthy stress relieving strategies.    Follow up plan: Return if symptoms worsen or fail to improve.  JRia Bush MD

## 2020-03-11 NOTE — Assessment & Plan Note (Signed)
Deterioration noted in setting of increasing family stressors. Will increase wellbutrin to 150mg  SR BID, will add trazodone 25-50mg  at night for sleep. Reviewed importance of stress relieving strategies. Update with effect.

## 2020-03-22 ENCOUNTER — Ambulatory Visit: Payer: 59 | Admitting: Internal Medicine

## 2020-04-01 ENCOUNTER — Other Ambulatory Visit: Payer: Self-pay | Admitting: Family Medicine

## 2020-04-13 ENCOUNTER — Other Ambulatory Visit: Payer: Self-pay | Admitting: Family Medicine

## 2020-04-19 ENCOUNTER — Other Ambulatory Visit: Payer: Self-pay

## 2020-04-19 ENCOUNTER — Ambulatory Visit: Payer: 59 | Admitting: Family Medicine

## 2020-04-19 ENCOUNTER — Encounter: Payer: Self-pay | Admitting: Family Medicine

## 2020-04-19 VITALS — BP 118/80 | HR 85 | Temp 96.4°F | Ht 65.0 in | Wt 282.2 lb

## 2020-04-19 DIAGNOSIS — E118 Type 2 diabetes mellitus with unspecified complications: Secondary | ICD-10-CM | POA: Diagnosis not present

## 2020-04-19 DIAGNOSIS — L0201 Cutaneous abscess of face: Secondary | ICD-10-CM | POA: Insufficient documentation

## 2020-04-19 MED ORDER — DOXYCYCLINE HYCLATE 100 MG PO TABS
100.0000 mg | ORAL_TABLET | Freq: Two times a day (BID) | ORAL | 0 refills | Status: DC
Start: 2020-04-19 — End: 2020-10-05

## 2020-04-19 NOTE — Patient Instructions (Addendum)
Complete antibiotics.  We will send a wound culture.  Use tramadol as needed for pain.  Call if not tolerating, redness spreading or fever on antibiotics.  Go to ER if severe pain.

## 2020-04-19 NOTE — Assessment & Plan Note (Addendum)
Send wound culture.  Small amount of discharge from gentle pressure.  Continue QID warm compress.  Can use tramadol prn for pain.  Complete antibiotics ( will treat with doxy given multiple abscess in past). Close follow up in 3 days.  return precautions given.
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
## 2020-04-19 NOTE — Addendum Note (Signed)
Addended by: Carter Kitten on: 04/19/2020 03:17 PM   Modules accepted: Orders

## 2020-04-22 ENCOUNTER — Ambulatory Visit: Payer: 59 | Admitting: Family Medicine

## 2020-04-22 LAB — WOUND CULTURE
MICRO NUMBER:: 11119799
SPECIMEN QUALITY:: ADEQUATE

## 2020-04-28 ENCOUNTER — Ambulatory Visit: Payer: 59 | Admitting: Internal Medicine

## 2020-05-03 ENCOUNTER — Other Ambulatory Visit: Payer: Self-pay | Admitting: Family Medicine

## 2020-06-29 ENCOUNTER — Ambulatory Visit: Payer: 59 | Admitting: Family Medicine

## 2020-07-01 ENCOUNTER — Encounter: Payer: Self-pay | Admitting: Family Medicine

## 2020-07-01 ENCOUNTER — Other Ambulatory Visit: Payer: Self-pay | Admitting: Family Medicine

## 2020-07-04 MED ORDER — OZEMPIC (0.25 OR 0.5 MG/DOSE) 2 MG/1.5ML ~~LOC~~ SOPN: PEN_INJECTOR | SUBCUTANEOUS | 0 refills | Status: DC

## 2020-07-04 NOTE — Telephone Encounter (Signed)
E-scribed refill 

## 2020-07-05 ENCOUNTER — Other Ambulatory Visit: Payer: Self-pay | Admitting: Family Medicine

## 2020-07-31 ENCOUNTER — Telehealth: Payer: Self-pay | Admitting: Family Medicine

## 2020-08-02 NOTE — Telephone Encounter (Signed)
Noted  

## 2020-08-02 NOTE — Telephone Encounter (Signed)
Pharmacy requests refill on: Glimepiride 2 mg   LAST REFILL: 04/12/2020  LAST OV: 03/11/2020 NEXT OV: Not Scheduled  PHARMACY: [place], [place]  Hgb A1C (09/21/2019): 6.5

## 2020-08-02 NOTE — Telephone Encounter (Signed)
Patient is scheduled EM 

## 2020-08-10 ENCOUNTER — Telehealth: Payer: Self-pay

## 2020-08-10 NOTE — Telephone Encounter (Signed)
Called and LVM for patient to call back and schedule an appointment per patient request. Please schedule appointment for patient when she returns call.

## 2020-08-10 NOTE — Telephone Encounter (Signed)
[facility] Night - Client Nonclinical Telephone Record  [facility] Client [facility] Night - Client Client Site [place] Physician Ria Bush - MD Contact Type Call Who Is Calling Patient / Member / Family / Caregiver Caller Name Kathleen Good Caller Phone Number [phone] Patient Name Kathleen Good Patient DOB 1961/04/30 Call Type Message Only Information Provided Reason for Call Request to Schedule Office Appointment Initial Comment Caller is wanting to schedule an appt tomorrow. Caller states she fell two weeks ago and her knee and index finger is not getting better. Additional Comment Provided caller with office hours and advised to call back during business hours. Caller declined triage. Disp. Time Disposition Final User 08/09/2020 5:07:24 PM General Information Provided Yes Pense, Lori Call Closed By: Gloris Ham Transaction Date/Time: 08/09/2020 5:05:06 PM (ET)

## 2020-08-23 ENCOUNTER — Encounter: Payer: Self-pay | Admitting: Family Medicine

## 2020-08-23 ENCOUNTER — Other Ambulatory Visit: Payer: Self-pay

## 2020-08-23 ENCOUNTER — Ambulatory Visit: Payer: 59 | Admitting: Family Medicine

## 2020-08-23 VITALS — BP 120/80 | HR 84 | Temp 97.5°F | Ht 65.0 in | Wt 289.4 lb

## 2020-08-23 DIAGNOSIS — F331 Major depressive disorder, recurrent, moderate: Secondary | ICD-10-CM

## 2020-08-23 DIAGNOSIS — S6991XA Unspecified injury of right wrist, hand and finger(s), initial encounter: Secondary | ICD-10-CM | POA: Insufficient documentation

## 2020-08-23 MED ORDER — MELOXICAM 15 MG PO TABS: 15.0000 mg | ORAL_TABLET | Freq: Every day | ORAL | 0 refills | Status: DC

## 2020-08-23 MED ORDER — CYANOCOBALAMIN 1000 MCG/ML IJ SOLN: INTRAMUSCULAR | 11 refills | Status: DC

## 2020-08-23 MED ORDER — BACLOFEN 10 MG PO TABS: 10.0000 mg | ORAL_TABLET | Freq: Three times a day (TID) | ORAL | 0 refills | Status: DC | PRN

## 2020-08-23 MED ORDER — HYDROXYZINE HCL 25 MG PO TABS: 25.0000 mg | ORAL_TABLET | Freq: Three times a day (TID) | ORAL | 0 refills | Status: DC | PRN

## 2020-08-23 MED ORDER — CITALOPRAM HYDROBROMIDE 10 MG PO TABS
10.0000 mg | ORAL_TABLET | Freq: Every day | ORAL | 3 refills | Status: DC
Start: 2020-08-23 — End: 2020-12-27

## 2020-08-23 NOTE — Telephone Encounter (Signed)
Placed form in Dr. G's box.  

## 2020-08-23 NOTE — Assessment & Plan Note (Signed)
Injury sustained ~3 wks ago.  Not consistent with fracture.  Anticipate finger sprains at 2nd and 3rd PIP joints.  Anticipate will heal well with continued supportive care.  Offered xrays, declined. She will return for xrays if worsening pain.

## 2020-08-23 NOTE — Patient Instructions (Addendum)
For hand - continue treatment as up to now. Not consistent with fracture. If any worsening, let us know for xray.  For situational stress/anxiety - continue wellbutrin and trazodone for sleep. Add on celexa 10mg  daily as well as hydroxyzine 25mg  as needed for anxiety/stress.  Let us know how you're doing on this regimen.

## 2020-08-23 NOTE — Assessment & Plan Note (Signed)
Situational deterioration in setting of family illnesses.  Support provided.  She is planning to establish with local FTD support groups as well.  Add celexa 10mg  daily to wellbutrin 150 bid and nightly trazodone.  Add hydroxyzine 25mg  PRN anxiety/stress.  Reviewed possible side effects. Update with effect.

## 2020-08-23 NOTE — Telephone Encounter (Signed)
Filled and in Lisa's box 

## 2020-08-23 NOTE — Progress Notes (Signed)
Patient ID: Kathleen Good, female    DOB: 10-23-1960, 60 y.o.   MRN: [id number]  This visit was conducted in person.  BP 120/80   Pulse 84   Temp (!) 97.5 F (36.4 C) (Temporal)   Ht 5' 5"  (1.651 m)   Wt 289 lb 6 oz (131.3 kg)   LMP 05/21/2014   SpO2 96%   BMI 48.15 kg/m    CC: anxiety, finger injury Subjective:   HPI: Kathleen Good is a 60 y.o. female presenting on 08/23/2020 for Finger Injury (C/o injuring left pointer finger after a fall.  Happened about 3 wks ago. ) and Stress (C/o stress and anxiety, worsened 4-5 mos ago. )   Increased stressors for last 4 months. Husband recently diagnosed with FTD, father with metastatic lung cancer. Son also with an illness. Even dog isn't doing well! Everything is falling on her, she is caregiver. About to lose insurance (husband may have to retire).   No significant depressed mood (more situational). + anhedonia. More irritable, trouble sleeping, stress eating. Trouble focusing, no guilt, low energy. Has restarted smoking. No SI/HI.  Has some online support groups she is reaching out to.  She is taking time out throughout the day for deep breathing, taking short walks.   Continues wellbutrin 135m bid, trazodone 562mnightly for sleep.   She had a fall 3-4 wks ago getting out of bath tub, injured L knee as well as R 2nd/3rd fingers, treated with meloxicam then aleve, used finger splint temporarily.   Lab Results  Component Value Date   HGBA1C 6.5 09/21/2019    Lab Results  Component Value Date   [id number] 8923/29/2021        Relevant past medical, surgical, family and social history reviewed and updated as indicated. Interim medical history since our last visit reviewed. Allergies and medications reviewed and updated. Outpatient Medications Prior to Visit  Medication Sig Dispense Refill  . albuterol (PROVENTIL) (2.5 MG/3ML) 0.083% nebulizer solution Take 3 mLs (2.5 mg total) by nebulization every 6 (six) hours as needed  for wheezing or shortness of breath. 150 mL 1  . aspirin EC 81 MG tablet Take 81 mg by mouth daily with supper.    . Blood Glucose Monitoring Suppl KIT Use to check sugar once daily 2 hours after a meal. Dx: E11.65 **Dispense per insurance guidelines** 1 each 0  . buPROPion (WELLBUTRIN SR) 150 MG 12 hr tablet Take 1 tablet (150 mg total) by mouth 2 (two) times daily. 180 tablet 1  . Cholecalciferol (VITAMIN D3) 25 MCG (1000 UT) CAPS Take 1 capsule (1,000 Units total) by mouth daily. 30 capsule   . clobetasol ointment (TEMOVATE) 0.1.19 Apply 1 application topically 2 (two) times daily. As needed 60 g 1  . Coenzyme Q10 ([employer] COQ-10) 200 MG capsule Take 1 capsule (200 mg total) by mouth daily.    . Cyanocobalamin (B-12) 1000 MCG SUBL Place 1 tablet under the tongue daily. 30 each   . diltiazem (TIAZAC) 180 MG 24 hr capsule TAKE ONE CAPSULE BY MOUTH DAILY 90 capsule 3  . diphenhydramine-acetaminophen (TYLENOL PM) 25-500 MG TABS tablet Take 2 tablets by mouth at bedtime as needed (pain).    . Marland Kitchenoxycycline (VIBRA-TABS) 100 MG tablet Take 1 tablet (100 mg total) by mouth 2 (two) times daily. 20 tablet 0  . glimepiride (AMARYL) 2 MG tablet TAKE ONE TABLET BY MOUTH DAILY WITH BREAKFAST 30 tablet 0  . hydrochlorothiazide (HYDRODIURIL) 25 MG  tablet TAKE ONE TABLET BY MOUTH DAILY AS NEEDED FOR SWELLING 30 tablet 1  . Magnesium 200 MG TABS Take 1 tablet (200 mg total) by mouth daily. 30 tablet   . Melatonin 5 MG TABS Take 1 tablet by mouth at bedtime as needed.    . metoprolol succinate (TOPROL-XL) 100 MG 24 hr tablet Take 1 tablet (100 mg total) by mouth 2 (two) times daily. Take with or immediately following a meal. 180 tablet 3  . naproxen sodium (ALEVE) 220 MG tablet Take 220 mg by mouth daily as needed. Takes 2 tablets    . Needle, Disp, 18G X 1.25" MISC 1 Units by Does not apply route every 30 (thirty) days. For b12 injection 10 each 1  . NEEDLE, DISP, 25 G 25G X 1" MISC Use as directed - 1 needle monthly  for b12 shot administration 10 each 1  . omeprazole (PRILOSEC) 40 MG capsule TAKE ONE CAPSULE BY MOUTH TWICE A DAY 60 capsule 4  . OneTouch Delica Lancets 78G MISC Use to check sugar twice daily as needed. Dispense based on insurance preference. Dx: E11.65 180 each 3  . ONETOUCH ULTRA test strip USE TO CHECK BLOOD SUGAR TWO TIMES A DAY 200 strip 1  . propranolol (INDERAL) 20 MG tablet Take 1 tablet (20 mg total) by mouth 3 (three) times daily as needed. (Patient taking differently: Take 20 mg by mouth 3 (three) times daily as needed (heart palpitations).) 90 tablet 3  . rosuvastatin (CRESTOR) 40 MG tablet TAKE ONE TABLET BY MOUTH DAILY 90 tablet 2  . Semaglutide,0.25 or 0.5MG/DOS, (OZEMPIC, 0.25 OR 0.5 MG/DOSE,) 2 MG/1.5ML SOPN DIAL AND INJECT UNDER THE SKIN 0.5 MG WEEKLY 1.5 mL 0  . traMADol (ULTRAM) 50 MG tablet Take 1 tablet (50 mg total) by mouth every 6 (six) hours as needed. 12 tablet 0  . traZODone (DESYREL) 50 MG tablet TAKE 1/2 TO 1 TABLET BY MOUTH AT BEDTIME AS NEEDED FOR SLEEP 30 tablet 3  . baclofen (LIORESAL) 10 MG tablet Take 1 tablet by mouth 3 (three) times daily as needed.    . cyanocobalamin (,VITAMIN B-12,) 1000 MCG/ML injection INJECT 1ML INTRAMUSCULARLY ONCE MONTHLY 1 mL 0  . meloxicam (MOBIC) 15 MG tablet Take 1 tablet (15 mg total) by mouth daily. 30 tablet 0   No facility-administered medications prior to visit.     Per HPI unless specifically indicated in ROS section below Review of Systems Objective:  BP 120/80   Pulse 84   Temp (!) 97.5 F (36.4 C) (Temporal)   Ht 5' 5"  (1.651 m)   Wt 289 lb 6 oz (131.3 kg)   LMP 05/21/2014   SpO2 96%   BMI 48.15 kg/m   Wt Readings from Last 3 Encounters:  08/23/20 289 lb 6 oz (131.3 kg)  04/19/20 282 lb 4 oz (128 kg)  03/11/20 283 lb 4 oz (128.5 kg)      Physical Exam Vitals and nursing note reviewed.  Constitutional:      Appearance: Normal appearance. She is not ill-appearing.  Musculoskeletal:        General:  Swelling and tenderness present.     Right lower leg: No edema.     Left lower leg: No edema.     Comments:  Tender swelling to 2nd/3rd PIP of R hand with FROM, able to flex DIP in isolation  2+ rad pulses  Skin:    General: Skin is warm and dry.     Findings: No  rash.  Neurological:     Mental Status: She is alert.     Comments: Sensation intact  Psychiatric:        Mood and Affect: Mood normal.        Behavior: Behavior normal.     Comments: Tearful with discussion of stressors        Assessment & Plan:  This visit occurred during the SARS-CoV-2 public health emergency.  Safety protocols were in place, including screening questions prior to the visit, additional usage of staff PPE, and extensive cleaning of exam room while observing appropriate contact time as indicated for disinfecting solutions.   Problem List Items Addressed This Visit    MDD (major depressive disorder), recurrent episode, moderate ([place]) - Primary    Situational deterioration in setting of family illnesses.  Support provided.  She is planning to establish with local FTD support groups as well.  Add celexa 104m daily to wellbutrin 150 bid and nightly trazodone.  Add hydroxyzine 263mPRN anxiety/stress.  Reviewed possible side effects. Update with effect.       Relevant Medications   citalopram (CELEXA) 10 MG tablet   hydrOXYzine (ATARAX/VISTARIL) 25 MG tablet   Finger injury, right, initial encounter    Injury sustained ~3 wks ago.  Not consistent with fracture.  Anticipate finger sprains at 2nd and 3rd PIP joints.  Anticipate will heal well with continued supportive care.  Offered xrays, declined. She will return for xrays if worsening pain.           Meds ordered this encounter  Medications  . baclofen (LIORESAL) 10 MG tablet    Sig: Take 1 tablet (10 mg total) by mouth 3 (three) times daily as needed.    Dispense:  30 tablet    Refill:  0  . cyanocobalamin (,VITAMIN B-12,) 1000 MCG/ML  injection    Sig: INJECT 1ML INTRAMUSCULARLY ONCE MONTHLY    Dispense:  1 mL    Refill:  11  . meloxicam (MOBIC) 15 MG tablet    Sig: Take 1 tablet (15 mg total) by mouth daily.    Dispense:  30 tablet    Refill:  0  . citalopram (CELEXA) 10 MG tablet    Sig: Take 1 tablet (10 mg total) by mouth daily.    Dispense:  30 tablet    Refill:  3  . hydrOXYzine (ATARAX/VISTARIL) 25 MG tablet    Sig: Take 1 tablet (25 mg total) by mouth 3 (three) times daily as needed for anxiety.    Dispense:  40 tablet    Refill:  0   No orders of the defined types were placed in this encounter.   Patient Instructions  For hand - continue treatment as up to now. Not consistent with fracture. If any worsening, let usKoreanow for xray.  For situational stress/anxiety - continue wellbutrin and trazodone for sleep. Add on celexa 1035maily as well as hydroxyzine 104m44m needed for anxiety/stress.  Let us kKoreaw how you're doing on this regimen.   Follow up plan: Return if symptoms worsen or fail to improve.  JaviRia Bush

## 2020-08-24 ENCOUNTER — Other Ambulatory Visit: Payer: Self-pay | Admitting: Family Medicine

## 2020-08-24 NOTE — Telephone Encounter (Signed)
Placed form at front office- yellow folders.

## 2020-09-18 ENCOUNTER — Other Ambulatory Visit: Payer: Self-pay | Admitting: Family Medicine

## 2020-09-23 ENCOUNTER — Telehealth: Payer: Self-pay | Admitting: Family Medicine

## 2020-09-23 DIAGNOSIS — S6991XA Unspecified injury of right wrist, hand and finger(s), initial encounter: Secondary | ICD-10-CM

## 2020-09-23 NOTE — Addendum Note (Signed)
Addended by: Ria Bush on: 09/23/2020 02:06 PM   Modules accepted: Orders

## 2020-09-23 NOTE — Telephone Encounter (Signed)
xrays ordered. She can come in at her convenience.

## 2020-09-23 NOTE — Telephone Encounter (Signed)
Spoke with pt relaying Dr. Synthia Innocent message.  Pt expresses her thanks and will come in on Monday.

## 2020-09-23 NOTE — Telephone Encounter (Signed)
Kathleen Good called in wanted to kn ow about getting a x-ray on her finger on her right hand.  Please advise

## 2020-09-27 ENCOUNTER — Other Ambulatory Visit: Payer: Self-pay | Admitting: Family Medicine

## 2020-09-27 DIAGNOSIS — E785 Hyperlipidemia, unspecified: Secondary | ICD-10-CM

## 2020-09-27 DIAGNOSIS — E1169 Type 2 diabetes mellitus with other specified complication: Secondary | ICD-10-CM

## 2020-09-27 DIAGNOSIS — E118 Type 2 diabetes mellitus with unspecified complications: Secondary | ICD-10-CM

## 2020-09-27 DIAGNOSIS — E559 Vitamin D deficiency, unspecified: Secondary | ICD-10-CM

## 2020-09-27 DIAGNOSIS — E538 Deficiency of other specified B group vitamins: Secondary | ICD-10-CM

## 2020-09-28 ENCOUNTER — Other Ambulatory Visit: Payer: 59

## 2020-09-30 ENCOUNTER — Other Ambulatory Visit: Payer: Self-pay

## 2020-09-30 ENCOUNTER — Other Ambulatory Visit (INDEPENDENT_AMBULATORY_CARE_PROVIDER_SITE_OTHER): Payer: 59

## 2020-09-30 ENCOUNTER — Ambulatory Visit (INDEPENDENT_AMBULATORY_CARE_PROVIDER_SITE_OTHER)
Admission: RE | Admit: 2020-09-30 | Discharge: 2020-09-30 | Disposition: A | Discharge status: Home / Self Care | Payer: 59 | Source: Ambulatory Visit | Attending: Family Medicine | Admitting: Family Medicine

## 2020-09-30 DIAGNOSIS — S6991XA Unspecified injury of right wrist, hand and finger(s), initial encounter: Secondary | ICD-10-CM

## 2020-09-30 DIAGNOSIS — E785 Hyperlipidemia, unspecified: Secondary | ICD-10-CM

## 2020-09-30 DIAGNOSIS — E118 Type 2 diabetes mellitus with unspecified complications: Secondary | ICD-10-CM | POA: Diagnosis not present

## 2020-09-30 DIAGNOSIS — E538 Deficiency of other specified B group vitamins: Secondary | ICD-10-CM | POA: Diagnosis not present

## 2020-09-30 DIAGNOSIS — E1169 Type 2 diabetes mellitus with other specified complication: Secondary | ICD-10-CM | POA: Diagnosis not present

## 2020-09-30 DIAGNOSIS — E559 Vitamin D deficiency, unspecified: Secondary | ICD-10-CM

## 2020-09-30 LAB — COMPREHENSIVE METABOLIC PANEL
ALT: 17 U/L (ref 0–35)
AST: 15 U/L (ref 0–37)
Albumin: 3.9 g/dL (ref 3.5–5.2)
Alkaline Phosphatase: 78 U/L (ref 39–117)
BUN: 10 mg/dL (ref 6–23)
CO2: 28 mEq/L (ref 19–32)
Calcium: 8.4 mg/dL (ref 8.4–10.5)
Chloride: 105 mEq/L (ref 96–112)
Creatinine, Ser: 0.88 mg/dL (ref 0.40–1.20)
GFR: 72.78 mL/min (ref >60.00)
Glucose, Bld: 140 mg/dL — ABNORMAL HIGH (ref 70–99)
Potassium: 3.8 mEq/L (ref 3.5–5.1)
Sodium: 139 mEq/L (ref 135–145)
Total Bilirubin: 0.3 mg/dL (ref 0.2–1.2)
Total Protein: 7 g/dL (ref 6.0–8.3)

## 2020-09-30 LAB — MICROALBUMIN / CREATININE URINE RATIO
Creatinine,U: 179 mg/dL
Microalb Creat Ratio: 2.3 mg/g (ref 0.0–30.0)
Microalb, Ur: 4.1 mg/dL — ABNORMAL HIGH (ref 0.0–1.9)

## 2020-09-30 LAB — LIPID PANEL
Cholesterol: 133 mg/dL (ref 0–200)
HDL: 41.6 mg/dL (ref >39.00)
LDL Cholesterol: 66 mg/dL (ref 0–99)
NonHDL: 90.97
Total CHOL/HDL Ratio: 3
Triglycerides: 123 mg/dL (ref 0.0–149.0)
VLDL: 24.6 mg/dL (ref 0.0–40.0)

## 2020-09-30 LAB — HEMOGLOBIN A1C: Hgb A1c MFr Bld: 6.9 % — ABNORMAL HIGH (ref 4.6–6.5)

## 2020-09-30 LAB — VITAMIN D 25 HYDROXY (VIT D DEFICIENCY, FRACTURES): VITD: 28.59 ng/mL — ABNORMAL LOW (ref 30.00–100.00)

## 2020-09-30 LAB — VITAMIN B12: Vitamin B-12: 503 pg/mL (ref 211–911)

## 2020-10-05 ENCOUNTER — Other Ambulatory Visit (HOSPITAL_COMMUNITY)
Admission: RE | Admit: 2020-10-05 | Discharge: 2020-10-05 | Disposition: A | Discharge status: Home / Self Care | Payer: 59 | Source: Ambulatory Visit | Attending: Family Medicine | Admitting: Family Medicine

## 2020-10-05 ENCOUNTER — Ambulatory Visit (INDEPENDENT_AMBULATORY_CARE_PROVIDER_SITE_OTHER): Payer: 59 | Admitting: Family Medicine

## 2020-10-05 ENCOUNTER — Other Ambulatory Visit: Payer: Self-pay

## 2020-10-05 ENCOUNTER — Encounter: Payer: Self-pay | Admitting: Family Medicine

## 2020-10-05 VITALS — BP 124/80 | HR 73 | Temp 98.1°F | Ht 64.5 in | Wt 295.3 lb

## 2020-10-05 DIAGNOSIS — E559 Vitamin D deficiency, unspecified: Secondary | ICD-10-CM

## 2020-10-05 DIAGNOSIS — Z86010 Personal history of colonic polyps: Secondary | ICD-10-CM

## 2020-10-05 DIAGNOSIS — Z1211 Encounter for screening for malignant neoplasm of colon: Secondary | ICD-10-CM

## 2020-10-05 DIAGNOSIS — Z599 Problem related to housing and economic circumstances, unspecified: Secondary | ICD-10-CM

## 2020-10-05 DIAGNOSIS — Z0000 Encounter for general adult medical examination without abnormal findings: Secondary | ICD-10-CM

## 2020-10-05 DIAGNOSIS — E118 Type 2 diabetes mellitus with unspecified complications: Secondary | ICD-10-CM

## 2020-10-05 DIAGNOSIS — I10 Essential (primary) hypertension: Secondary | ICD-10-CM

## 2020-10-05 DIAGNOSIS — G4733 Obstructive sleep apnea (adult) (pediatric): Secondary | ICD-10-CM

## 2020-10-05 DIAGNOSIS — K219 Gastro-esophageal reflux disease without esophagitis: Secondary | ICD-10-CM

## 2020-10-05 DIAGNOSIS — E1169 Type 2 diabetes mellitus with other specified complication: Secondary | ICD-10-CM

## 2020-10-05 DIAGNOSIS — Z01419 Encounter for gynecological examination (general) (routine) without abnormal findings: Secondary | ICD-10-CM | POA: Diagnosis not present

## 2020-10-05 DIAGNOSIS — F17200 Nicotine dependence, unspecified, uncomplicated: Secondary | ICD-10-CM

## 2020-10-05 DIAGNOSIS — E785 Hyperlipidemia, unspecified: Secondary | ICD-10-CM

## 2020-10-05 DIAGNOSIS — E538 Deficiency of other specified B group vitamins: Secondary | ICD-10-CM

## 2020-10-05 DIAGNOSIS — R609 Edema, unspecified: Secondary | ICD-10-CM

## 2020-10-05 DIAGNOSIS — Z9689 Presence of other specified functional implants: Secondary | ICD-10-CM

## 2020-10-05 DIAGNOSIS — F331 Major depressive disorder, recurrent, moderate: Secondary | ICD-10-CM

## 2020-10-05 MED ORDER — HYDROCHLOROTHIAZIDE 25 MG PO TABS: 25.0000 mg | ORAL_TABLET | Freq: Every day | ORAL | 3 refills | Status: DC | PRN

## 2020-10-05 MED ORDER — VITAMIN D 50 MCG (2000 UT) PO CAPS: 1.0000 | ORAL_CAPSULE | Freq: Every day | ORAL | Status: AC

## 2020-10-05 NOTE — Assessment & Plan Note (Signed)
Chronic, deterioration with increased stressors of family illness and financial concerns.  No significant improvement despite addition of celexa - continue this and wellbutrin, trazodone for night time.

## 2020-10-05 NOTE — Assessment & Plan Note (Addendum)
Chronic, overall stable period on ozempic and amaryl - may need to change off ozempic when loses insurance. Will look into financial assistance for ozempic.

## 2020-10-05 NOTE — Assessment & Plan Note (Addendum)
Restarted smoking this year due to increased stressors.  Encouraged full cessation. Doesn't meet criteria for lung cancer screening program.

## 2020-10-05 NOTE — Assessment & Plan Note (Signed)
Chronic, stable on rosuvastatin - continue The 10-year ASCVD risk score Mikey Bussing DC Brooke Bonito., et al., 2013) is: 5.1%   Values used to calculate the score:     Age: 60 years     Sex: Female     Is Non-Hispanic African American: No     Diabetic: Yes     Tobacco smoker: No     Systolic Blood Pressure: 898 mmHg     Is BP treated: Yes     HDL Cholesterol: 41.6 mg/dL     Total Cholesterol: 133 mg/dL

## 2020-10-05 NOTE — Assessment & Plan Note (Addendum)
Preventative protocols reviewed and updated unless pt declined. Discussed healthy diet and lifestyle.  Pap performed today given uncertain future insurance status

## 2020-10-05 NOTE — Assessment & Plan Note (Signed)
Overdue for colonoscopy. fmhx colon cancer. Concern as will be losing insurance. Agrees to iFOB to start.

## 2020-10-05 NOTE — Assessment & Plan Note (Signed)
Was unable to afford CPAP.

## 2020-10-05 NOTE — Patient Instructions (Addendum)
Pap smear today  Call to see if you can get mammogram scheduled in this month. Even if unable to schedule, let me know to consider scholarship for free mammogram. [place] [phone] Pass by lab to pick up stool kit.  We will see if you qualify for ozempic assistance.  Take daily miralax 1/2 capful to combat constipation.   Health Maintenance for Postmenopausal Women Menopause is a normal process in which your ability to get pregnant comes to an end. This process happens slowly over many months or years, usually between the ages of 5 and 78. Menopause is complete when you have missed your menstrual periods for 12 months. It is important to talk with your health care provider about some of the most common conditions that affect women after menopause (postmenopausal women). These include heart disease, cancer, and bone loss (osteoporosis). Adopting a healthy lifestyle and getting preventive care can help to promote your health and wellness. The actions you take can also lower your chances of developing some of these common conditions. What should I know about menopause? During menopause, you may get a number of symptoms, such as:  Hot flashes. These can be moderate or severe.  Night sweats.  Decrease in sex drive.  Mood swings.  Headaches.  Tiredness.  Irritability.  Memory problems.  Insomnia. Choosing to treat or not to treat these symptoms is a decision that you make with your health care provider. Do I need hormone replacement therapy?  Hormone replacement therapy is effective in treating symptoms that are caused by menopause, such as hot flashes and night sweats.  Hormone replacement carries certain risks, especially as you become older. If you are thinking about using estrogen or estrogen with progestin, discuss the benefits and risks with your health care provider. What is my risk for heart disease and stroke? The risk of heart disease, heart attack,  and stroke increases as you age. One of the causes may be a change in the body's hormones during menopause. This can affect how your body uses dietary fats, triglycerides, and cholesterol. Heart attack and stroke are medical emergencies. There are many things that you can do to help prevent heart disease and stroke. Watch your blood pressure  High blood pressure causes heart disease and increases the risk of stroke. This is more likely to develop in people who have high blood pressure readings, are of African descent, or are overweight.  Have your blood pressure checked: ? Every 3-5 years if you are 103-47 years of age. ? Every year if you are 60 years old or older. Eat a healthy diet  Eat a diet that includes plenty of vegetables, fruits, low-fat dairy products, and lean protein.  Do not eat a lot of foods that are high in solid fats, added sugars, or sodium.   Get regular exercise Get regular exercise. This is one of the most important things you can do for your health. Most adults should:  Try to exercise for at least 150 minutes each week. The exercise should increase your heart rate and make you sweat (moderate-intensity exercise).  Try to do strengthening exercises at least twice each week. Do these in addition to the moderate-intensity exercise.  Spend less time sitting. Even light physical activity can be beneficial. Other tips  Work with your health care provider to achieve or maintain a healthy weight.  Do not use any products that contain nicotine or tobacco, such as cigarettes, e-cigarettes, and chewing tobacco. If you need  help quitting, ask your health care provider.  Know your numbers. Ask your health care provider to check your cholesterol and your blood sugar (glucose). Continue to have your blood tested as directed by your health care provider. Do I need screening for cancer? Depending on your health history and family history, you may need to have cancer screening at  different stages of your life. This may include screening for:  Breast cancer.  Cervical cancer.  Lung cancer.  Colorectal cancer. What is my risk for osteoporosis? After menopause, you may be at increased risk for osteoporosis. Osteoporosis is a condition in which bone destruction happens more quickly than new bone creation. To help prevent osteoporosis or the bone fractures that can happen because of osteoporosis, you may take the following actions:  If you are 19-20 years old, get at least 1,000 mg of calcium and at least 600 mg of vitamin D per day.  If you are older than age 33 but younger than age 52, get at least 1,200 mg of calcium and at least 600 mg of vitamin D per day.  If you are older than age 81, get at least 1,200 mg of calcium and at least 800 mg of vitamin D per day. Smoking and drinking excessive alcohol increase the risk of osteoporosis. Eat foods that are rich in calcium and vitamin D, and do weight-bearing exercises several times each week as directed by your health care provider. How does menopause affect my mental health? Depression may occur at any age, but it is more common as you become older. Common symptoms of depression include:  Low or sad mood.  Changes in sleep patterns.  Changes in appetite or eating patterns.  Feeling an overall lack of motivation or enjoyment of activities that you previously enjoyed.  Frequent crying spells. Talk with your health care provider if you think that you are experiencing depression. General instructions See your health care provider for regular wellness exams and vaccines. This may include:  Scheduling regular health, dental, and eye exams.  Getting and maintaining your vaccines. These include: ? Influenza vaccine. Get this vaccine each year before the flu season begins. ? Pneumonia vaccine. ? Shingles vaccine. ? Tetanus, diphtheria, and pertussis (Tdap) booster vaccine. Your health care provider may also  recommend other immunizations. Tell your health care provider if you have ever been abused or do not feel safe at home. Summary  Menopause is a normal process in which your ability to get pregnant comes to an end.  This condition causes hot flashes, night sweats, decreased interest in sex, mood swings, headaches, or lack of sleep.  Treatment for this condition may include hormone replacement therapy.  Take actions to keep yourself healthy, including exercising regularly, eating a healthy diet, watching your weight, and checking your blood pressure and blood sugar levels.  Get screened for cancer and depression. Make sure that you are up to date with all your vaccines. This information is not intended to replace advice given to you by your health care provider. Make sure you discuss any questions you have with your health care provider. Document Revised: 06/04/2018 Document Reviewed: 06/04/2018 Elsevier Patient Education  2021 [employer].

## 2020-10-05 NOTE — Assessment & Plan Note (Signed)
Will look into financial assistance for ozempic and mammogram scholarship for patient.

## 2020-10-05 NOTE — Assessment & Plan Note (Signed)
Chronic, stable continue current regimen.  

## 2020-10-05 NOTE — Assessment & Plan Note (Signed)
Weight gain noted despite ozempic.  Increased stressors recently.

## 2020-10-05 NOTE — Assessment & Plan Note (Signed)
Will restart HCTZ PRN.

## 2020-10-05 NOTE — Assessment & Plan Note (Signed)
Continues monthly injections + daily oral b12

## 2020-10-05 NOTE — Progress Notes (Signed)
Patient ID: Kathleen Good, female    DOB: 12-18-1960, 60 y.o.   MRN: [id number]  This visit was conducted in person.  BP 124/80   Pulse 73   Temp 98.1 F (36.7 C) (Temporal)   Ht 5' 4.5" (1.638 m)   Wt 295 lb 5 oz (134 kg)   LMP 05/21/2014   SpO2 97%   BMI 49.91 kg/m    CC: CPE  Subjective:   HPI: Kathleen Good is a 60 y.o. female presenting on 10/05/2020 for Annual Exam   Notes increased swelling to L>R legs as well as weight gain. Ran out of HCTZ which she uses PRN swelling.   Insurance is running out next month. Husband being forced to retire due to early onset FTD. Looking into purchasing insurance through marketplace.   Preventative: COLONOSCOPY Date: 12/11/2011 hyperplastic polyp, rpt 5 yrs given fmhx - father with colon cancer ager 29yo Gatha Mayer). OVERDUE. Agrees to iFOB.  Well woman exam done with PCP - last 10/2017 - normal pap smear. rpt today.  LMP 04/2014. Breast cancer screening - last mammogram 04/2012 normal. Due for rpt.Does breast exams at home. She can call breast cancer for this.  Lung cancer screening - not eligible.  Flu - yearly [place] 09/2019 x2 Pneumovax 2015  Tdap - 11/2010, Td 11/2019 shingrix - 03/2019, 06/2019 Advanced directive - does not have at home. Would want husband to be HCPOA.  Seat belt use discussed Sunscreen use discussed. No suspicious moles. Ex smoker - quit 2014, 15 PY history. Restarted 2022, <1ppd.  Alcohol -rare  Dentist - about yearly  Eye exam - 2021, normal  Lives with husband and cats and dogs Occupation: housewife Activity: walk dogs but limited byback pain Diet: good water, some fruits/vegetables     Relevant past medical, surgical, family and social history reviewed and updated as indicated. Interim medical history since our last visit reviewed. Allergies and medications reviewed and updated. Outpatient Medications Prior to Visit  Medication Sig Dispense Refill  . albuterol (PROVENTIL)  (2.5 MG/3ML) 0.083% nebulizer solution Take 3 mLs (2.5 mg total) by nebulization every 6 (six) hours as needed for wheezing or shortness of breath. 150 mL 1  . aspirin EC 81 MG tablet Take 81 mg by mouth daily with supper.    . baclofen (LIORESAL) 10 MG tablet Take 1 tablet (10 mg total) by mouth 3 (three) times daily as needed. 30 tablet 0  . Blood Glucose Monitoring Suppl KIT Use to check sugar once daily 2 hours after a meal. Dx: E11.65 **Dispense per insurance guidelines** 1 each 0  . buPROPion (WELLBUTRIN SR) 150 MG 12 hr tablet Take 1 tablet (150 mg total) by mouth 2 (two) times daily. 180 tablet 1  . citalopram (CELEXA) 10 MG tablet Take 1 tablet (10 mg total) by mouth daily. 30 tablet 3  . clobetasol ointment (TEMOVATE) 0.62 % Apply 1 application topically 2 (two) times daily. As needed 60 g 1  . Coenzyme Q10 ([employer] COQ-10) 200 MG capsule Take 1 capsule (200 mg total) by mouth daily.    . cyanocobalamin (,VITAMIN B-12,) 1000 MCG/ML injection INJECT 1ML INTRAMUSCULARLY ONCE MONTHLY 1 mL 11  . Cyanocobalamin (B-12) 1000 MCG SUBL Place 1 tablet under the tongue daily. 30 each   . diltiazem (TIAZAC) 180 MG 24 hr capsule TAKE ONE CAPSULE BY MOUTH DAILY 90 capsule 3  . diphenhydramine-acetaminophen (TYLENOL PM) 25-500 MG TABS tablet Take 2 tablets by mouth at  bedtime as needed (pain).    Marland Kitchen glimepiride (AMARYL) 2 MG tablet TAKE ONE TABLET BY MOUTH DAILY WITH BREAKFAST 30 tablet 1  . hydrOXYzine (ATARAX/VISTARIL) 25 MG tablet Take 1 tablet (25 mg total) by mouth 3 (three) times daily as needed for anxiety. 40 tablet 0  . Magnesium 200 MG TABS Take 1 tablet (200 mg total) by mouth daily. 30 tablet   . Melatonin 5 MG TABS Take 1 tablet by mouth at bedtime as needed.    . meloxicam (MOBIC) 15 MG tablet TAKE ONE TABLET BY MOUTH DAILY 30 tablet 0  . metoprolol succinate (TOPROL-XL) 100 MG 24 hr tablet Take 1 tablet (100 mg total) by mouth 2 (two) times daily. Take with or immediately following a meal. 180  tablet 3  . naproxen sodium (ALEVE) 220 MG tablet Take 220 mg by mouth daily as needed. Takes 2 tablets    . Needle, Disp, 18G X 1.25" MISC 1 Units by Does not apply route every 30 (thirty) days. For b12 injection 10 each 1  . NEEDLE, DISP, 25 G 25G X 1" MISC Use as directed - 1 needle monthly for b12 shot administration 10 each 1  . omeprazole (PRILOSEC) 40 MG capsule TAKE ONE CAPSULE BY MOUTH TWICE A DAY 60 capsule 4  . OneTouch Delica Lancets 62B MISC Use to check sugar twice daily as needed. Dispense based on insurance preference. Dx: E11.65 180 each 3  . ONETOUCH ULTRA test strip USE TO CHECK BLOOD SUGAR TWO TIMES A DAY 200 strip 1  . propranolol (INDERAL) 20 MG tablet Take 1 tablet (20 mg total) by mouth 3 (three) times daily as needed. (Patient taking differently: Take 20 mg by mouth 3 (three) times daily as needed (heart palpitations).) 90 tablet 3  . rosuvastatin (CRESTOR) 40 MG tablet TAKE ONE TABLET BY MOUTH DAILY 90 tablet 2  . Semaglutide,0.25 or 0.5MG/DOS, (OZEMPIC, 0.25 OR 0.5 MG/DOSE,) 2 MG/1.5ML SOPN DIAL AND INJECT UNDER THE SKIN 0.5 MG WEEKLY 1.5 mL 0  . traMADol (ULTRAM) 50 MG tablet Take 1 tablet (50 mg total) by mouth every 6 (six) hours as needed. 12 tablet 0  . traZODone (DESYREL) 50 MG tablet TAKE 1/2 TO 1 TABLET BY MOUTH AT BEDTIME AS NEEDED FOR SLEEP 30 tablet 3  . Cholecalciferol (VITAMIN D3) 25 MCG (1000 UT) CAPS Take 1 capsule (1,000 Units total) by mouth daily. 30 capsule   . hydrochlorothiazide (HYDRODIURIL) 25 MG tablet TAKE ONE TABLET BY MOUTH DAILY AS NEEDED FOR SWELLING 30 tablet 1  . doxycycline (VIBRA-TABS) 100 MG tablet Take 1 tablet (100 mg total) by mouth 2 (two) times daily. 20 tablet 0   No facility-administered medications prior to visit.     Per HPI unless specifically indicated in ROS section below Review of Systems  Constitutional: Negative for activity change, appetite change, chills, fatigue, fever and unexpected weight change.  HENT: Negative  for hearing loss.   Eyes: Negative for visual disturbance.  Respiratory: Positive for chest tightness (one episode last week associated with racing heart x1 day). Negative for cough, shortness of breath and wheezing.   Cardiovascular: Positive for palpitations and leg swelling. Negative for chest pain.  Gastrointestinal: Positive for constipation (ozempic related - managed with miralax). Negative for abdominal distention, abdominal pain, blood in stool, diarrhea, nausea and vomiting.  Genitourinary: Negative for difficulty urinating and hematuria.  Musculoskeletal: Negative for arthralgias, myalgias and neck pain.  Skin: Negative for rash.  Neurological: Negative for dizziness, seizures, syncope  and headaches.  Hematological: Negative for adenopathy. Does not bruise/bleed easily.  Psychiatric/Behavioral: Positive for dysphoric mood. The patient is nervous/anxious.    Objective:  BP 124/80   Pulse 73   Temp 98.1 F (36.7 C) (Temporal)   Ht 5' 4.5" (1.638 m)   Wt 295 lb 5 oz (134 kg)   LMP 05/21/2014   SpO2 97%   BMI 49.91 kg/m   Wt Readings from Last 3 Encounters:  10/05/20 295 lb 5 oz (134 kg)  08/23/20 289 lb 6 oz (131.3 kg)  04/19/20 282 lb 4 oz (128 kg)      Physical Exam Vitals and nursing note reviewed. Exam conducted with a chaperone present.  Constitutional:      General: She is not in acute distress.    Appearance: Normal appearance. She is well-developed. She is not ill-appearing.  HENT:     Head: Normocephalic and atraumatic.     Right Ear: Hearing, tympanic membrane, ear canal and external ear normal.     Left Ear: Hearing, tympanic membrane, ear canal and external ear normal.  Eyes:     General: No scleral icterus.    Conjunctiva/sclera: Conjunctivae normal.     Pupils: Pupils are equal, round, and reactive to light.  Neck:     Thyroid: No thyroid mass or thyromegaly.  Cardiovascular:     Rate and Rhythm: Normal rate and regular rhythm.     Pulses: Normal  pulses.          Radial pulses are 2+ on the right side and 2+ on the left side.     Heart sounds: Normal heart sounds. No murmur heard.   Pulmonary:     Effort: Pulmonary effort is normal. No respiratory distress.     Breath sounds: Normal breath sounds. No wheezing, rhonchi or rales.  Chest:  Breasts:     Right: Normal. No swelling, bleeding, inverted nipple, mass, nipple discharge, axillary adenopathy or supraclavicular adenopathy.     Left: Normal. No swelling, bleeding, inverted nipple, mass, nipple discharge, axillary adenopathy or supraclavicular adenopathy.    Abdominal:     General: Bowel sounds are normal. There is no distension.     Palpations: Abdomen is soft. There is no mass.     Tenderness: There is no abdominal tenderness. There is no guarding or rebound.     Hernia: No hernia is present.  Genitourinary:    Exam position: Supine.     Labia:        Right: No rash, tenderness or lesion.        Left: No rash, tenderness or lesion.      Urethra: No prolapse.     Vagina: Normal.     Cervix: Normal.     Uterus: Normal.      Adnexa:        Right: No mass.         Left: No mass.       Comments: Pap performed on cervix Musculoskeletal:        General: Normal range of motion.     Cervical back: Normal range of motion and neck supple.     Right lower leg: No edema.     Left lower leg: No edema.  Lymphadenopathy:     Cervical: No cervical adenopathy.     Upper Body:     Right upper body: No supraclavicular or axillary adenopathy.     Left upper body: No supraclavicular or axillary adenopathy.  Skin:  General: Skin is warm and dry.     Findings: No rash.  Neurological:     General: No focal deficit present.     Mental Status: She is alert and oriented to person, place, and time.     Comments: CN grossly intact, station and gait intact  Psychiatric:        Mood and Affect: Mood normal.        Behavior: Behavior normal.        Thought Content: Thought content  normal.        Judgment: Judgment normal.       Results for orders placed or performed in visit on 09/30/20  Microalbumin / creatinine urine ratio  Result Value Ref Range   Microalb, Ur 4.1 (H) 0.0 - 1.9 mg/dL   Creatinine,U 179.0 mg/dL   Microalb Creat Ratio 2.3 0.0 - 30.0 mg/g  VITAMIN D 25 Hydroxy (Vit-D Deficiency, Fractures)  Result Value Ref Range   VITD 28.59 (L) 30.00 - 100.00 ng/mL  Vitamin B12  Result Value Ref Range   Vitamin B-12 503 211 - 911 pg/mL  Hemoglobin A1c  Result Value Ref Range   Hgb A1c MFr Bld 6.9 (H) 4.6 - 6.5 %  Comprehensive metabolic panel  Result Value Ref Range   Sodium 139 135 - 145 mEq/L   Potassium 3.8 3.5 - 5.1 mEq/L   Chloride 105 96 - 112 mEq/L   CO2 28 19 - 32 mEq/L   Glucose, Bld 140 (H) 70 - 99 mg/dL   BUN 10 6 - 23 mg/dL   Creatinine, Ser 0.88 0.40 - 1.20 mg/dL   Total Bilirubin 0.3 0.2 - 1.2 mg/dL   Alkaline Phosphatase 78 39 - 117 U/L   AST 15 0 - 37 U/L   ALT 17 0 - 35 U/L   Total Protein 7.0 6.0 - 8.3 g/dL   Albumin 3.9 3.5 - 5.2 g/dL   GFR 72.78 >60.00 mL/min   Calcium 8.4 8.4 - 10.5 mg/dL  Lipid panel  Result Value Ref Range   Cholesterol 133 0 - 200 mg/dL   Triglycerides 123.0 0.0 - 149.0 mg/dL   HDL 41.60 >39.00 mg/dL   VLDL 24.6 0.0 - 40.0 mg/dL   LDL Cholesterol 66 0 - 99 mg/dL   Total CHOL/HDL Ratio 3    NonHDL 90.97    Depression screen [facility] 2/9 10/05/2020 08/23/2020 03/11/2020 09/21/2019 06/02/2019  Decreased Interest 2 3 1  0 0  Down, Depressed, Hopeless 1 2 1 1  0  PHQ - 2 Score 3 5 2 1  0  Altered sleeping 3 3 3 3 3   Tired, decreased energy 3 3 3 2 3   Change in appetite 1 2 3 1  0  Feeling bad or failure about yourself  0 0 0 0 0  Trouble concentrating 0 1 2 0 0  Moving slowly or fidgety/restless 0 0 0 0 0  Suicidal thoughts 0 0 0 0 0  PHQ-9 Score 10 14 13 7 6   Difficult doing work/chores - - - - -    GAD 7 : Generalized Anxiety Score 10/05/2020 08/23/2020 03/11/2020 09/21/2019  Nervous, Anxious, on Edge 3 3 3 1    Control/stop worrying 3 3 2 1   Worry too much - different things 2 3 2  0  Trouble relaxing 3 2 3  0  Restless 0 1 0 0  Easily annoyed or irritable 3 3 3 2   Afraid - awful might happen 1 3 2  0  Total GAD 7 Score  15 18 15 4    Assessment & Plan:  This visit occurred during the SARS-CoV-2 public health emergency.  Safety protocols were in place, including screening questions prior to the visit, additional usage of staff PPE, and extensive cleaning of exam room while observing appropriate contact time as indicated for disinfecting solutions.   Problem List Items Addressed This Visit    Smoker    Restarted smoking this year due to increased stressors.  Encouraged full cessation. Doesn't meet criteria for lung cancer screening program.       MDD (major depressive disorder), recurrent episode, moderate (HCC)    Chronic, deterioration with increased stressors of family illness and financial concerns.  No significant improvement despite addition of celexa - continue this and wellbutrin, trazodone for night time.      GERD    Continues omeprazole 65m daily - bid.       Controlled diabetes mellitus type 2 with complications (HCC)    Chronic, overall stable period on ozempic and amaryl - may need to change off ozempic when loses insurance. Will look into financial assistance for ozempic.      Personal history of colonic polyps    Overdue for colonoscopy. fmhx colon cancer. Concern as will be losing insurance. Agrees to iFOB to start.       Dyslipidemia associated with type 2 diabetes mellitus (HCC)    Chronic, stable on rosuvastatin - continue The 10-year ASCVD risk score (Mikey BussingDC Jr., et al., 2013) is: 5.1%   Values used to calculate the score:     Age: 89years     Sex: Female     Is Non-Hispanic African American: No     Diabetic: Yes     Tobacco smoker: No     Systolic Blood Pressure: 1757mmHg     Is BP treated: Yes     HDL Cholesterol: 41.6 mg/dL     Total Cholesterol: 133  mg/dL       HYPERTENSION, BENIGN    Chronic, stable continue current regimen.       Relevant Medications   hydrochlorothiazide (HYDRODIURIL) 25 MG tablet   Healthcare maintenance - Primary    Preventative protocols reviewed and updated unless pt declined. Discussed healthy diet and lifestyle.  Pap performed today given uncertain future insurance status      Obesity, morbid, BMI 40.0-49.9 ([place]    Weight gain noted despite ozempic.  Increased stressors recently.       OSA (obstructive sleep apnea)    Was unable to afford CPAP.       Vitamin B12 deficiency    Continues monthly injections + daily oral b12      Vitamin D deficiency    rec increase vit D replacement to 2000 IU daily.       Peripheral edema    Will restart HCTZ PRN.       Status post insertion of spinal cord stimulator   Financial difficulties    Will look into financial assistance for ozempic and mammogram scholarship for patient.        Other Visit Diagnoses    Special screening for malignant neoplasms, colon       Relevant Orders   Fecal occult blood, imunochemical   Encounter for annual routine gynecological examination       Relevant Orders   Cytology - PAP       Meds ordered this encounter  Medications  . hydrochlorothiazide (HYDRODIURIL) 25 MG tablet    Sig: Take 1  tablet (25 mg total) by mouth daily as needed (leg swelling).    Dispense:  30 tablet    Refill:  3  . Cholecalciferol (VITAMIN D) 50 MCG (2000 UT) CAPS    Sig: Take 1 capsule (2,000 Units total) by mouth daily.    Dispense:  30 capsule   Orders Placed This Encounter  Procedures  . Fecal occult blood, imunochemical    Standing Status:   Future    Standing Expiration Date:   10/05/2021    Patient instructions: Pap smear today  Call to see if you can get mammogram scheduled in this month. Even if unable to schedule, let me know to consider scholarship for free mammogram. [place] [phone] Pass  by lab to pick up stool kit.  We will see if you qualify for ozempic assistance.  Take daily miralax 1/2 capful to combat constipation.   Follow up plan: Return in about 1 year (around 10/05/2021) for annual exam, prior fasting for blood work.  Ria Bush, MD

## 2020-10-05 NOTE — Assessment & Plan Note (Signed)
rec increase vit D replacement to 2000 IU daily.

## 2020-10-05 NOTE — Assessment & Plan Note (Addendum)
Continues omeprazole 40mg  daily - bid.

## 2020-10-06 ENCOUNTER — Telehealth: Payer: Self-pay

## 2020-10-06 LAB — CYTOLOGY - PAP
Comment: NEGATIVE
Diagnosis: UNDETERMINED — AB
High risk HPV: NEGATIVE

## 2020-10-06 NOTE — Telephone Encounter (Addendum)
-----   Message from Ria Bush, MD sent at 10/05/2020  1:50 PM EDT ----- She may also need help with mammogram scholarship.   Thanks!    Ria Bush, MD  Randall An, RN Wanted to see if you could help me with this - patient is losing insurance at end of the month.  She has done very well with ozempic - can we provide her with forms to apply for patient assistance for ozempic?  Let me know if I should send this to someone else.  Thanks!  Garlon Hatchet    Advised pt of the [place] and [employer] pt assistance program. Advised she could pick up paperwork, complete it, and bring it back with any necessary financial documents and we would fax it for her. Pt said she would stop by and pick it up. Advised if any questions to contact clinic. Pt verbalized understanding and was very appreciative.   Placed both applications at front desk for pt to pick up.

## 2020-10-09 ENCOUNTER — Encounter: Payer: Self-pay | Admitting: Family Medicine

## 2020-10-09 DIAGNOSIS — R87610 Atypical squamous cells of undetermined significance on cytologic smear of cervix (ASC-US): Secondary | ICD-10-CM | POA: Insufficient documentation

## 2020-10-10 NOTE — Telephone Encounter (Signed)
Noted pt to apply for patient assistance program through [employer]

## 2020-10-12 ENCOUNTER — Encounter: Payer: Self-pay | Admitting: Family Medicine

## 2020-10-13 MED ORDER — ROSUVASTATIN CALCIUM 40 MG PO TABS: 40.0000 mg | ORAL_TABLET | Freq: Every day | ORAL | 3 refills | Status: DC

## 2020-10-13 MED ORDER — BUPROPION HCL ER (SR) 150 MG PO TB12: 150.0000 mg | ORAL_TABLET | Freq: Two times a day (BID) | ORAL | 1 refills | Status: DC

## 2020-10-13 MED ORDER — MELOXICAM 15 MG PO TABS: 1.0000 | ORAL_TABLET | Freq: Every day | ORAL | 0 refills | Status: DC

## 2020-10-13 MED ORDER — CYANOCOBALAMIN 1000 MCG/ML IJ SOLN: INTRAMUSCULAR | 11 refills | Status: DC

## 2020-10-13 MED ORDER — HYDROXYZINE HCL 25 MG PO TABS: 25.0000 mg | ORAL_TABLET | Freq: Three times a day (TID) | ORAL | 2 refills | Status: DC | PRN

## 2020-10-13 MED ORDER — TRAZODONE HCL 50 MG PO TABS: 25.0000 mg | ORAL_TABLET | Freq: Every evening | ORAL | 5 refills | Status: DC | PRN

## 2020-10-13 MED ORDER — BACLOFEN 10 MG PO TABS: 10.0000 mg | ORAL_TABLET | Freq: Three times a day (TID) | ORAL | 0 refills | Status: DC | PRN

## 2020-10-13 MED ORDER — DILTIAZEM HCL ER BEADS 180 MG PO CP24: 180.0000 mg | ORAL_CAPSULE | Freq: Every day | ORAL | 3 refills | Status: DC

## 2020-10-13 NOTE — Telephone Encounter (Signed)
Baclofen last rx:  08/23/20, #30/0 Meloxicam last rx:  09/19/20, #30/0 Last OV:  10/05/20, CPE Next OV:  none

## 2020-10-13 NOTE — Telephone Encounter (Signed)
ERx 

## 2020-10-15 ENCOUNTER — Other Ambulatory Visit: Payer: Self-pay | Admitting: Family Medicine

## 2020-11-05 ENCOUNTER — Other Ambulatory Visit: Payer: Self-pay | Admitting: Family Medicine

## 2020-11-09 ENCOUNTER — Other Ambulatory Visit: Payer: Self-pay | Admitting: Family Medicine

## 2020-11-09 NOTE — Telephone Encounter (Signed)
Meloxicam Last filled:  10/13/20, #30 Last OV:  10/05/20, CPE Next OV:  none

## 2020-11-13 ENCOUNTER — Encounter: Payer: Self-pay | Admitting: Family Medicine

## 2020-11-22 MED ORDER — BUSPIRONE HCL 7.5 MG PO TABS: 7.5000 mg | ORAL_TABLET | Freq: Two times a day (BID) | ORAL | 3 refills | Status: DC

## 2020-11-22 NOTE — Addendum Note (Signed)
Addended by: Ria Bush on: 11/22/2020 08:40 AM   Modules accepted: Orders

## 2020-12-07 ENCOUNTER — Encounter: Payer: Self-pay | Admitting: Family Medicine

## 2020-12-07 DIAGNOSIS — E118 Type 2 diabetes mellitus with unspecified complications: Secondary | ICD-10-CM

## 2020-12-08 NOTE — Addendum Note (Signed)
Addended by: Ria Bush on: 12/08/2020 12:48 PM   Modules accepted: Orders

## 2020-12-12 ENCOUNTER — Other Ambulatory Visit: Payer: Self-pay | Admitting: Family Medicine

## 2020-12-13 NOTE — Telephone Encounter (Signed)
Meloxicam Last filled:  11/11/20, #30 Last OV:  10/05/20, CPE Next OV:  none

## 2020-12-25 MED ORDER — PIOGLITAZONE HCL 30 MG PO TABS: 30.0000 mg | ORAL_TABLET | Freq: Every day | ORAL | 3 refills | Status: DC

## 2020-12-25 MED ORDER — GLIMEPIRIDE 4 MG PO TABS: 4.0000 mg | ORAL_TABLET | Freq: Every day | ORAL | 1 refills | Status: DC

## 2020-12-25 NOTE — Addendum Note (Signed)
Addended by: Ria Bush on: 12/25/2020 12:16 PM   Modules accepted: Orders

## 2020-12-27 ENCOUNTER — Other Ambulatory Visit: Payer: Self-pay | Admitting: Family Medicine

## 2021-01-13 ENCOUNTER — Other Ambulatory Visit: Payer: Self-pay | Admitting: Family Medicine

## 2021-01-13 NOTE — Telephone Encounter (Signed)
Meloxicam Last filled:  12/13/20, #30 Last OV:  10/05/20, CPE Next OV:  none

## 2021-01-13 NOTE — Telephone Encounter (Signed)
ERx 

## 2021-01-13 NOTE — Telephone Encounter (Signed)
Refill request Last office visit 10/05/20 Upcoming none scheduled Last refill  Hydroxyzine 10/13/20 #40/2 Baclofen 10/13/20 #30

## 2021-01-25 ENCOUNTER — Encounter: Payer: Self-pay | Admitting: Family Medicine

## 2021-01-26 MED ORDER — DOXYCYCLINE HYCLATE 100 MG PO TABS: 100.0000 mg | ORAL_TABLET | Freq: Two times a day (BID) | ORAL | 0 refills | Status: DC

## 2021-02-15 ENCOUNTER — Other Ambulatory Visit: Payer: Self-pay | Admitting: Family Medicine

## 2021-03-26 ENCOUNTER — Other Ambulatory Visit: Payer: Self-pay | Admitting: Family Medicine

## 2021-03-27 ENCOUNTER — Other Ambulatory Visit: Payer: Self-pay | Admitting: Family Medicine

## 2021-03-29 ENCOUNTER — Other Ambulatory Visit: Payer: Self-pay | Admitting: Family Medicine

## 2021-03-29 NOTE — Telephone Encounter (Signed)
Buspar was stopped 11/2020 due to sedation  Does she want to restart this?

## 2021-03-30 NOTE — Telephone Encounter (Signed)
Lvm asking pt to call back.  Need to know relay Dr. Synthia Innocent message and get answer to his question.

## 2021-03-31 NOTE — Telephone Encounter (Signed)
Lvm asking pt to call back.  Need to know relay Dr. Synthia Innocent message and get answer to his question.

## 2021-04-04 NOTE — Telephone Encounter (Signed)
ERx 

## 2021-04-04 NOTE — Telephone Encounter (Signed)
Pt returned Lisa's call. She states she does still take 1 at night, but she is unable to take 1 during the day due to the sedation.

## 2021-04-10 ENCOUNTER — Other Ambulatory Visit: Payer: Self-pay | Admitting: Family Medicine

## 2021-04-24 ENCOUNTER — Other Ambulatory Visit: Payer: Self-pay | Admitting: Family Medicine

## 2021-04-24 NOTE — Telephone Encounter (Signed)
Meloxicam Last filled:  03/27/21, #30 Last OV:  10/05/20, CPE Next OV:  none

## 2021-05-10 ENCOUNTER — Other Ambulatory Visit: Payer: Self-pay | Admitting: Family Medicine

## 2021-05-11 NOTE — Telephone Encounter (Signed)
Baclofen Last filled:  01/17/21, #30 Last OV:  10/05/20, CPE Next OV:  none

## 2021-06-20 IMAGING — DX DG HAND COMPLETE 3+V*R*
3 series · 3 of 3 positions shown · non-contrast
Comparison: None.

CLINICAL DATA: Right hand injury July 2020.  Pain.

EXAM:
RIGHT HAND - COMPLETE 3+ VIEW

[hand ap]
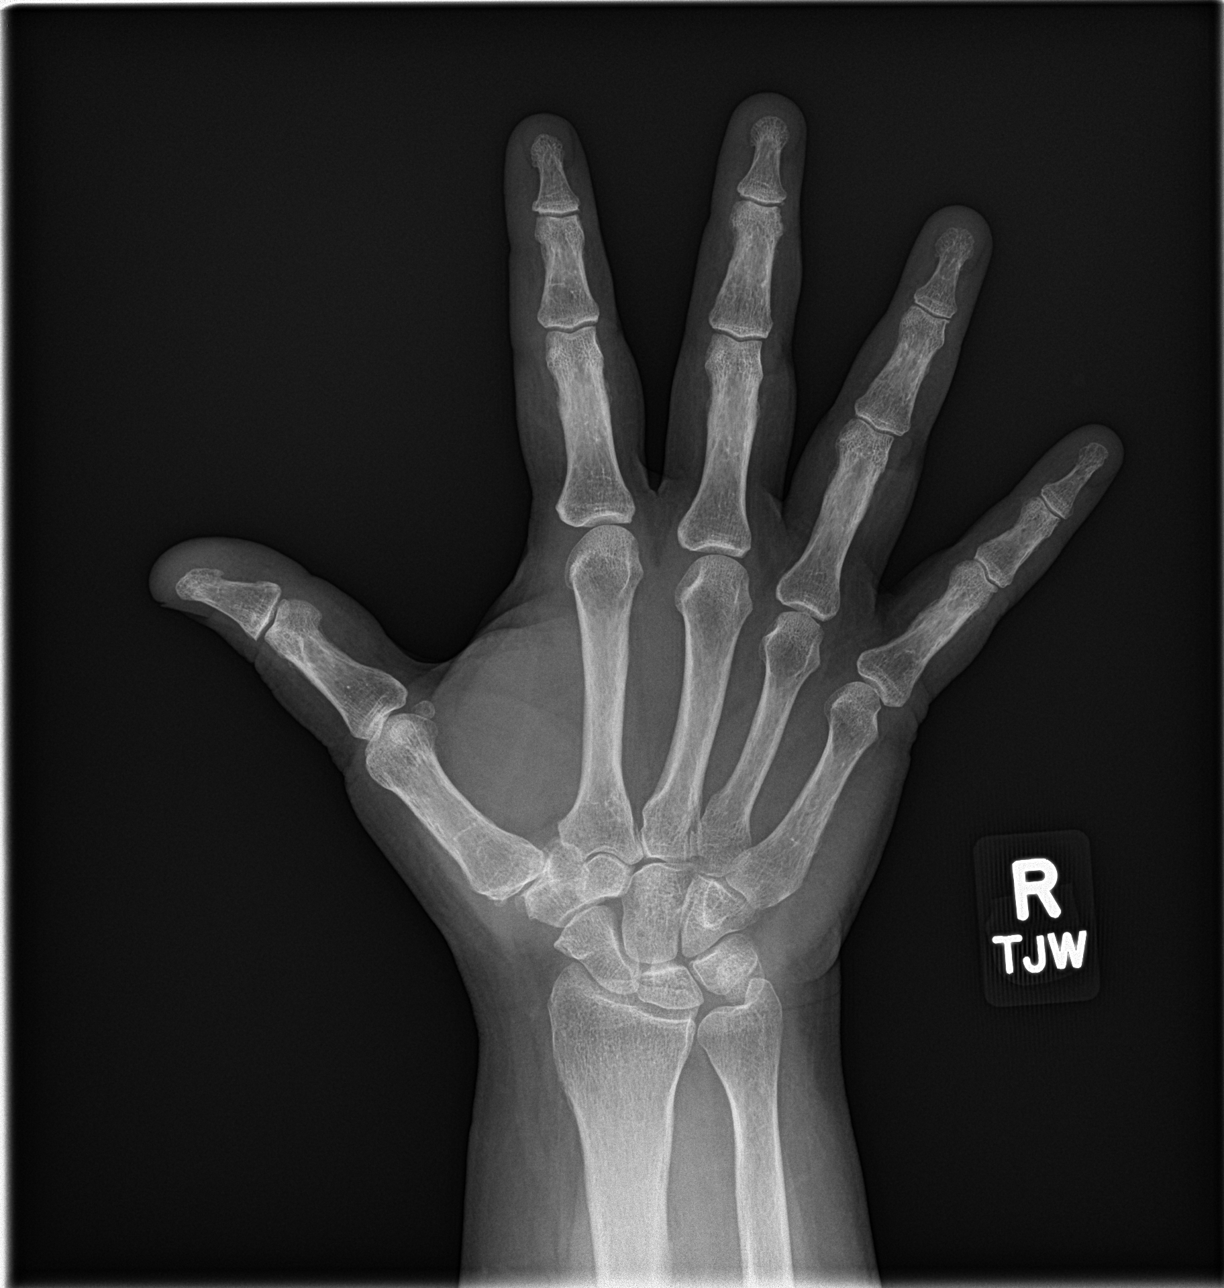

[hand obl]
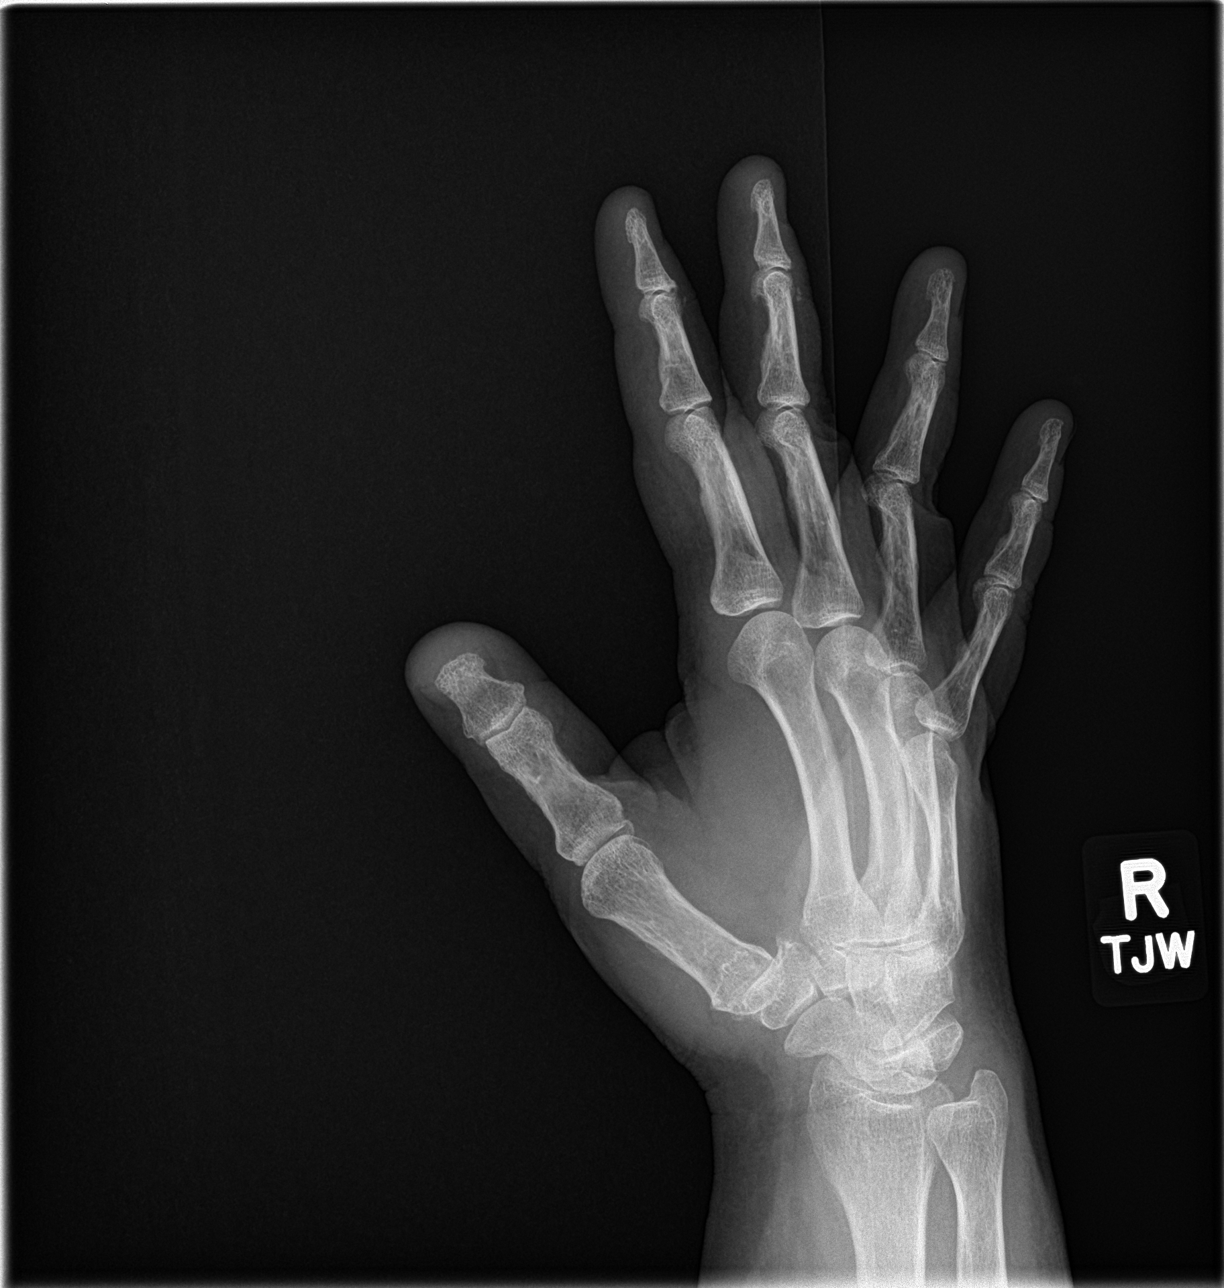

[hand lat]
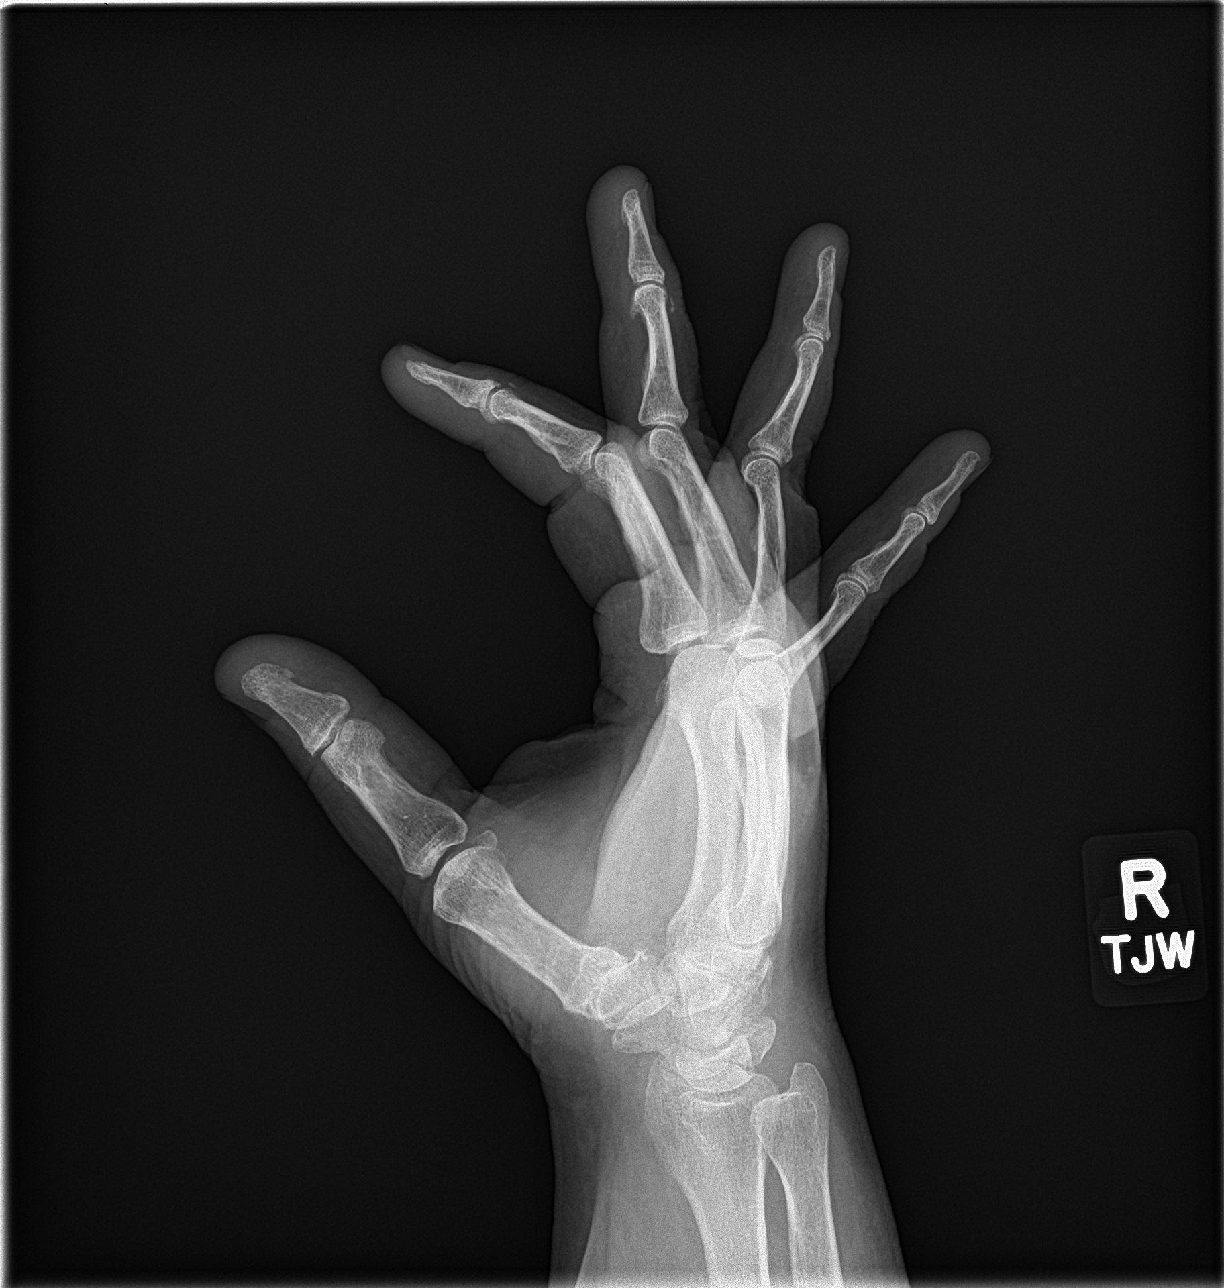

[3 of 3 positions shown; findings below may reference images not displayed]

FINDINGS: Degenerative changes in the distal interphalangeal joints of the
second and third digits. No fractures or dislocations.
IMPRESSION: Degenerative changes in the distal interphalangeal joints of the
second and third digits. No other abnormalities.

## 2021-06-24 ENCOUNTER — Other Ambulatory Visit: Payer: Self-pay | Admitting: Family Medicine

## 2021-06-29 ENCOUNTER — Ambulatory Visit: Payer: 59 | Admitting: Nurse Practitioner

## 2021-06-30 ENCOUNTER — Telehealth: Payer: Self-pay

## 2021-06-30 ENCOUNTER — Other Ambulatory Visit: Payer: Self-pay | Admitting: Family Medicine

## 2021-06-30 NOTE — Telephone Encounter (Signed)
Received request from [employer]mymeds/[employer] that Ozempic needs PA. Submitted information that was provided by [employer] through cover mymeds but received message that patient does not have that insurance-not sure what was submitted by the pharmacy. Looking in the chart there is no insurance for the patient on file.  Left detailed message for the patient to call us back and provide that information. Key for PA is [id number]  But will have to start a different PA request since the original key was done with wrong insurance information

## 2021-07-05 NOTE — Telephone Encounter (Signed)
Spoke with pt relaying message about PA and insurance.  Pt states she provided new insurance info to pharmacy.  But she will go by [facility] to have new card scanned into chart so PA can be resubmitted.

## 2021-07-10 NOTE — Telephone Encounter (Signed)
I don't see pt's new ins card has been scanned into chart yet.    Lvm asking pt to call back.  I need her to verbally provide following info from her card so I can submit Ozempic PA:  BIN: PCN: RxGroup: ID:

## 2021-07-11 NOTE — Telephone Encounter (Signed)
I don't see pt's new ins card has been scanned into chart yet.    Lvm asking pt to call back.  I need her to verbally provide following info from her card so I can submit Ozempic PA:  BIN: PCN: RxGroup: ID:

## 2021-07-14 ENCOUNTER — Encounter: Payer: Self-pay | Admitting: Family Medicine

## 2021-07-14 NOTE — Telephone Encounter (Signed)
Spoke with Kathleen Good asking for pt's new ins info.  Told they do not have any on file for pt.   Spoke with pt relaying message above.  States she did take card to [facility] and they have been filling other meds on it.  In the meantime, I suggested she take a pic of front and back of card to upload via [place].  Says she will do that so I can submit PA.

## 2021-07-17 ENCOUNTER — Other Ambulatory Visit: Payer: Self-pay | Admitting: Family Medicine

## 2021-07-26 NOTE — Telephone Encounter (Signed)
Received MyChart message from pt with new ins info. (See Pt Msg, 07/25/21)  Submitted PA; key: [id number].  Decision pending.

## 2021-07-26 NOTE — Telephone Encounter (Signed)
Submitted PA with new ins info.  Decision pending. (See 06/30/21 phn note)  Fyi to Dr. Darnell Level.

## 2021-08-14 ENCOUNTER — Telehealth: Payer: Self-pay | Admitting: Family Medicine

## 2021-08-14 MED ORDER — OZEMPIC (0.25 OR 0.5 MG/DOSE) 2 MG/1.5ML ~~LOC~~ SOPN: PEN_INJECTOR | SUBCUTANEOUS | 0 refills | Status: DC

## 2021-08-14 NOTE — Telephone Encounter (Addendum)
Received faxed PA approval, valid 07/26/2021- 07/26/2022.

## 2021-08-14 NOTE — Telephone Encounter (Signed)
Spoke with pt notifying her PA was approved (see 06/30/21 phn note).  Says she is aware of approval but pharmacy needs rx.  E-scribed refill.

## 2021-08-14 NOTE — Telephone Encounter (Signed)
Patient calling re: Ozempic prior auth  Patient wants to know if someone can call her back with the status. She believes it is already authorized  Please contact the patient at [phone]

## 2021-08-18 ENCOUNTER — Encounter: Payer: Self-pay | Admitting: Family Medicine

## 2021-08-18 ENCOUNTER — Other Ambulatory Visit: Payer: Self-pay

## 2021-08-18 ENCOUNTER — Telehealth (INDEPENDENT_AMBULATORY_CARE_PROVIDER_SITE_OTHER): Payer: 59 | Admitting: Family Medicine

## 2021-08-18 VITALS — Temp 100.1°F | Ht 64.5 in | Wt 296.0 lb

## 2021-08-18 DIAGNOSIS — H9202 Otalgia, left ear: Secondary | ICD-10-CM | POA: Diagnosis not present

## 2021-08-18 DIAGNOSIS — J0100 Acute maxillary sinusitis, unspecified: Secondary | ICD-10-CM | POA: Diagnosis not present

## 2021-08-18 MED ORDER — AZITHROMYCIN 250 MG PO TABS: ORAL_TABLET | ORAL | 0 refills | Status: DC

## 2021-08-18 NOTE — Assessment & Plan Note (Signed)
Given persistent and recurrent symptoms with unilateral face pain ear pain and fever... treat with nasal steroid, nasal saline and antibiotics.  return precautions given.
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
## 2021-08-27 ENCOUNTER — Other Ambulatory Visit: Payer: Self-pay | Admitting: Family Medicine

## 2021-08-28 NOTE — Telephone Encounter (Signed)
E-scribed refill.  Plz schedule lab and cpe visits for additional refills.  ?

## 2021-09-09 ENCOUNTER — Other Ambulatory Visit: Payer: Self-pay | Admitting: Family Medicine

## 2021-09-10 NOTE — Telephone Encounter (Signed)
Patient due for appt with Dr. Darnell Level; please call to schedule.  ?

## 2021-09-18 NOTE — Telephone Encounter (Signed)
Lvm for pt to schedule cpe/lab and sent my chart letter ?

## 2021-09-21 NOTE — Telephone Encounter (Signed)
Labs and CPE scheduled for 10/2021 ?

## 2021-09-24 ENCOUNTER — Other Ambulatory Visit: Payer: Self-pay | Admitting: Family Medicine

## 2021-09-26 NOTE — Telephone Encounter (Signed)
Refill request Baclofen last refill 06/26/21 #30 ?Hydroxyzine last refill 08/28/21 #40 ?Last office visit acute 08/18/21 ?Upcoming appointment 11/15/21 ?

## 2021-09-27 NOTE — Telephone Encounter (Signed)
ERx 

## 2021-09-30 ENCOUNTER — Other Ambulatory Visit: Payer: Self-pay | Admitting: Family Medicine

## 2021-10-08 ENCOUNTER — Other Ambulatory Visit: Payer: Self-pay | Admitting: Family Medicine

## 2021-10-15 ENCOUNTER — Other Ambulatory Visit: Payer: Self-pay | Admitting: Family Medicine

## 2021-10-20 ENCOUNTER — Other Ambulatory Visit: Payer: Self-pay | Admitting: Family Medicine

## 2021-10-20 DIAGNOSIS — Z1231 Encounter for screening mammogram for malignant neoplasm of breast: Secondary | ICD-10-CM

## 2021-10-23 ENCOUNTER — Encounter: Payer: Self-pay | Admitting: Family Medicine

## 2021-10-23 ENCOUNTER — Telehealth: Payer: Self-pay | Admitting: Family Medicine

## 2021-10-23 DIAGNOSIS — Z1231 Encounter for screening mammogram for malignant neoplasm of breast: Secondary | ICD-10-CM

## 2021-10-23 NOTE — Telephone Encounter (Signed)
I've ordered external order. Plz fax over and notify pt.  ?

## 2021-10-23 NOTE — Telephone Encounter (Signed)
Kathleen Good, Denardo [[id number] ? ?Patient calling re: Mammogram order ? ?Needs mammogram sent to [facility] in [place] ? ?Please notify patient when she can schedule ?

## 2021-10-24 NOTE — Telephone Encounter (Signed)
Order faxed and confirmed.

## 2021-10-26 ENCOUNTER — Other Ambulatory Visit: Payer: Self-pay | Admitting: Family Medicine

## 2021-10-30 ENCOUNTER — Other Ambulatory Visit: Payer: Self-pay | Admitting: Family Medicine

## 2021-11-04 ENCOUNTER — Other Ambulatory Visit: Payer: Self-pay | Admitting: Family Medicine

## 2021-11-05 ENCOUNTER — Other Ambulatory Visit: Payer: Self-pay | Admitting: Family Medicine

## 2021-11-06 ENCOUNTER — Other Ambulatory Visit: Payer: Self-pay | Admitting: Family Medicine

## 2021-11-06 DIAGNOSIS — E538 Deficiency of other specified B group vitamins: Secondary | ICD-10-CM

## 2021-11-06 DIAGNOSIS — E118 Type 2 diabetes mellitus with unspecified complications: Secondary | ICD-10-CM

## 2021-11-06 DIAGNOSIS — E559 Vitamin D deficiency, unspecified: Secondary | ICD-10-CM

## 2021-11-06 DIAGNOSIS — E1169 Type 2 diabetes mellitus with other specified complication: Secondary | ICD-10-CM

## 2021-11-06 NOTE — Telephone Encounter (Signed)
Error

## 2021-11-08 ENCOUNTER — Other Ambulatory Visit (INDEPENDENT_AMBULATORY_CARE_PROVIDER_SITE_OTHER): Payer: 59

## 2021-11-08 DIAGNOSIS — E118 Type 2 diabetes mellitus with unspecified complications: Secondary | ICD-10-CM | POA: Diagnosis not present

## 2021-11-08 DIAGNOSIS — E538 Deficiency of other specified B group vitamins: Secondary | ICD-10-CM

## 2021-11-08 DIAGNOSIS — E1169 Type 2 diabetes mellitus with other specified complication: Secondary | ICD-10-CM | POA: Diagnosis not present

## 2021-11-08 DIAGNOSIS — E785 Hyperlipidemia, unspecified: Secondary | ICD-10-CM

## 2021-11-08 DIAGNOSIS — E559 Vitamin D deficiency, unspecified: Secondary | ICD-10-CM

## 2021-11-08 LAB — COMPREHENSIVE METABOLIC PANEL
ALT: 36 U/L — ABNORMAL HIGH (ref 0–35)
AST: 37 U/L (ref 0–37)
Albumin: 4 g/dL (ref 3.5–5.2)
Alkaline Phosphatase: 67 U/L (ref 39–117)
BUN: 13 mg/dL (ref 6–23)
CO2: 24 mEq/L (ref 19–32)
Calcium: 8.8 mg/dL (ref 8.4–10.5)
Chloride: 105 mEq/L (ref 96–112)
Creatinine, Ser: 1.01 mg/dL (ref 0.40–1.20)
GFR: 61.22 mL/min (ref >60.00)
Glucose, Bld: 160 mg/dL — ABNORMAL HIGH (ref 70–99)
Potassium: 4.1 mEq/L (ref 3.5–5.1)
Sodium: 138 mEq/L (ref 135–145)
Total Bilirubin: 0.3 mg/dL (ref 0.2–1.2)
Total Protein: 7.1 g/dL (ref 6.0–8.3)

## 2021-11-08 LAB — LIPID PANEL
Cholesterol: 164 mg/dL (ref 0–200)
HDL: 39.3 mg/dL (ref >39.00)
NonHDL: 124.3
Total CHOL/HDL Ratio: 4
Triglycerides: 215 mg/dL — ABNORMAL HIGH (ref 0.0–149.0)
VLDL: 43 mg/dL — ABNORMAL HIGH (ref 0.0–40.0)

## 2021-11-08 LAB — HEMOGLOBIN A1C: Hgb A1c MFr Bld: 7.4 % — ABNORMAL HIGH (ref 4.6–6.5)

## 2021-11-08 LAB — LDL CHOLESTEROL, DIRECT: Direct LDL: 93 mg/dL

## 2021-11-08 LAB — VITAMIN D 25 HYDROXY (VIT D DEFICIENCY, FRACTURES): VITD: 52.2 ng/mL (ref 30.00–100.00)

## 2021-11-08 LAB — MICROALBUMIN / CREATININE URINE RATIO
Creatinine,U: 197.6 mg/dL
Microalb Creat Ratio: 1.4 mg/g (ref 0.0–30.0)
Microalb, Ur: 2.8 mg/dL — ABNORMAL HIGH (ref 0.0–1.9)

## 2021-11-08 LAB — VITAMIN B12: Vitamin B-12: 759 pg/mL (ref 211–911)

## 2021-11-15 ENCOUNTER — Ambulatory Visit (INDEPENDENT_AMBULATORY_CARE_PROVIDER_SITE_OTHER): Payer: 59 | Admitting: Family Medicine

## 2021-11-15 ENCOUNTER — Encounter: Payer: Self-pay | Admitting: Family Medicine

## 2021-11-15 VITALS — BP 122/76 | HR 85 | Temp 97.9°F | Ht 64.25 in | Wt 287.2 lb

## 2021-11-15 DIAGNOSIS — R87610 Atypical squamous cells of undetermined significance on cytologic smear of cervix (ASC-US): Secondary | ICD-10-CM

## 2021-11-15 DIAGNOSIS — Z0000 Encounter for general adult medical examination without abnormal findings: Secondary | ICD-10-CM | POA: Diagnosis not present

## 2021-11-15 DIAGNOSIS — E538 Deficiency of other specified B group vitamins: Secondary | ICD-10-CM

## 2021-11-15 DIAGNOSIS — K219 Gastro-esophageal reflux disease without esophagitis: Secondary | ICD-10-CM

## 2021-11-15 DIAGNOSIS — E559 Vitamin D deficiency, unspecified: Secondary | ICD-10-CM

## 2021-11-15 DIAGNOSIS — E785 Hyperlipidemia, unspecified: Secondary | ICD-10-CM

## 2021-11-15 DIAGNOSIS — E1169 Type 2 diabetes mellitus with other specified complication: Secondary | ICD-10-CM

## 2021-11-15 DIAGNOSIS — F331 Major depressive disorder, recurrent, moderate: Secondary | ICD-10-CM | POA: Diagnosis not present

## 2021-11-15 DIAGNOSIS — E118 Type 2 diabetes mellitus with unspecified complications: Secondary | ICD-10-CM

## 2021-11-15 DIAGNOSIS — Z7189 Other specified counseling: Secondary | ICD-10-CM

## 2021-11-15 DIAGNOSIS — F17200 Nicotine dependence, unspecified, uncomplicated: Secondary | ICD-10-CM

## 2021-11-15 DIAGNOSIS — Z800 Family history of malignant neoplasm of digestive organs: Secondary | ICD-10-CM

## 2021-11-15 DIAGNOSIS — I10 Essential (primary) hypertension: Secondary | ICD-10-CM

## 2021-11-15 MED ORDER — ROSUVASTATIN CALCIUM 40 MG PO TABS: 40.0000 mg | ORAL_TABLET | Freq: Every day | ORAL | 3 refills | Status: DC

## 2021-11-15 MED ORDER — CITALOPRAM HYDROBROMIDE 10 MG PO TABS: 10.0000 mg | ORAL_TABLET | Freq: Every day | ORAL | 3 refills | Status: DC

## 2021-11-15 MED ORDER — SEMAGLUTIDE (1 MG/DOSE) 4 MG/3ML ~~LOC~~ SOPN: 1.0000 mg | PEN_INJECTOR | SUBCUTANEOUS | 11 refills | Status: DC

## 2021-11-15 MED ORDER — GLIMEPIRIDE 4 MG PO TABS: 4.0000 mg | ORAL_TABLET | Freq: Every day | ORAL | 3 refills | Status: DC

## 2021-11-15 MED ORDER — CYANOCOBALAMIN 1000 MCG/ML IJ SOLN: INTRAMUSCULAR | 11 refills | Status: DC

## 2021-11-15 MED ORDER — BUPROPION HCL ER (SR) 150 MG PO TB12: 150.0000 mg | ORAL_TABLET | Freq: Two times a day (BID) | ORAL | 3 refills | Status: DC

## 2021-11-15 MED ORDER — METOPROLOL SUCCINATE ER 100 MG PO TB24: 100.0000 mg | ORAL_TABLET | Freq: Two times a day (BID) | ORAL | 3 refills | Status: DC

## 2021-11-15 MED ORDER — TRAZODONE HCL 50 MG PO TABS: 50.0000 mg | ORAL_TABLET | Freq: Every evening | ORAL | 6 refills | Status: DC | PRN

## 2021-11-15 MED ORDER — PIOGLITAZONE HCL 30 MG PO TABS: 30.0000 mg | ORAL_TABLET | Freq: Every day | ORAL | 3 refills | Status: DC

## 2021-11-15 MED ORDER — BUSPIRONE HCL 7.5 MG PO TABS: 7.5000 mg | ORAL_TABLET | Freq: Every day | ORAL | 3 refills | Status: DC

## 2021-11-15 MED ORDER — OMEPRAZOLE 40 MG PO CPDR: 40.0000 mg | DELAYED_RELEASE_CAPSULE | Freq: Two times a day (BID) | ORAL | 3 refills | Status: DC

## 2021-11-15 MED ORDER — DILTIAZEM HCL ER BEADS 180 MG PO CP24: 180.0000 mg | ORAL_CAPSULE | Freq: Every day | ORAL | 3 refills | Status: DC

## 2021-11-15 NOTE — Assessment & Plan Note (Signed)
Preventative protocols reviewed and updated unless pt declined. Discussed healthy diet and lifestyle.  

## 2021-11-15 NOTE — Assessment & Plan Note (Signed)
Chronic, difficult period due to recent family illness.  Overall stable on current regimen which includes Wellbutrin, Celexa, trazodone- we will continue this.

## 2021-11-15 NOTE — Assessment & Plan Note (Signed)
Chronic, stable on omeprazole 40 mg twice daily-continue this.

## 2021-11-15 NOTE — Assessment & Plan Note (Signed)
Encouraged colonoscopy but she prefers to start with stool kit.

## 2021-11-15 NOTE — Patient Instructions (Addendum)
Pass by lab to pick up stool kit.  You are doing well today Congratulations on weight loss to date! Advanced directive packet provided today Try higher ozempic dose 82m weekly.  Return in 6 months for diabetes follow up visit.   Health Maintenance for Postmenopausal Women Menopause is a normal process in which your ability to get pregnant comes to an end. This process happens slowly over many months or years, usually between the ages of 455and 565 Menopause is complete when you have missed your menstrual period for 12 months. It is important to talk with your health care provider about some of the most common conditions that affect women after menopause (postmenopausal women). These include heart disease, cancer, and bone loss (osteoporosis). Adopting a healthy lifestyle and getting preventive care can help to promote your health and wellness. The actions you take can also lower your chances of developing some of these common conditions. What are the signs and symptoms of menopause? During menopause, you may have the following symptoms: Hot flashes. These can be moderate or severe. Night sweats. Decrease in sex drive. Mood swings. Headaches. Tiredness (fatigue). Irritability. Memory problems. Problems falling asleep or staying asleep. Talk with your health care provider about treatment options for your symptoms. Do I need hormone replacement therapy? Hormone replacement therapy is effective in treating symptoms that are caused by menopause, such as hot flashes and night sweats. Hormone replacement carries certain risks, especially as you become older. If you are thinking about using estrogen or estrogen with progestin, discuss the benefits and risks with your health care provider. How can I reduce my risk for heart disease and stroke? The risk of heart disease, heart attack, and stroke increases as you age. One of the causes may be a change in the body's hormones during menopause. This can  affect how your body uses dietary fats, triglycerides, and cholesterol. Heart attack and stroke are medical emergencies. There are many things that you can do to help prevent heart disease and stroke. Watch your blood pressure High blood pressure causes heart disease and increases the risk of stroke. This is more likely to develop in people who have high blood pressure readings or are overweight. Have your blood pressure checked: Every 3-5 years if you are 15689years of age. Every year if you are 470years old or older. Eat a healthy diet  Eat a diet that includes plenty of vegetables, fruits, low-fat dairy products, and lean protein. Do not eat a lot of foods that are high in solid fats, added sugars, or sodium. Get regular exercise Get regular exercise. This is one of the most important things you can do for your health. Most adults should: Try to exercise for at least 150 minutes each week. The exercise should increase your heart rate and make you sweat (moderate-intensity exercise). Try to do strengthening exercises at least twice each week. Do these in addition to the moderate-intensity exercise. Spend less time sitting. Even light physical activity can be beneficial. Other tips Work with your health care provider to achieve or maintain a healthy weight. Do not use any products that contain nicotine or tobacco. These products include cigarettes, chewing tobacco, and vaping devices, such as e-cigarettes. If you need help quitting, ask your health care provider. Know your numbers. Ask your health care provider to check your cholesterol and your blood sugar (glucose). Continue to have your blood tested as directed by your health care provider. Do I need screening for cancer? Depending on  your health history and family history, you may need to have cancer screenings at different stages of your life. This may include screening for: Breast cancer. Cervical cancer. Lung cancer. Colorectal  cancer. What is my risk for osteoporosis? After menopause, you may be at increased risk for osteoporosis. Osteoporosis is a condition in which bone destruction happens more quickly than new bone creation. To help prevent osteoporosis or the bone fractures that can happen because of osteoporosis, you may take the following actions: If you are 37-14 years old, get at least 1,000 mg of calcium and at least 600 international units (IU) of vitamin D per day. If you are older than age 11 but younger than age 65, get at least 1,200 mg of calcium and at least 600 international units (IU) of vitamin D per day. If you are older than age 52, get at least 1,200 mg of calcium and at least 800 international units (IU) of vitamin D per day. Smoking and drinking excessive alcohol increase the risk of osteoporosis. Eat foods that are rich in calcium and vitamin D, and do weight-bearing exercises several times each week as directed by your health care provider. How does menopause affect my mental health? Depression may occur at any age, but it is more common as you become older. Common symptoms of depression include: Feeling depressed. Changes in sleep patterns. Changes in appetite or eating patterns. Feeling an overall lack of motivation or enjoyment of activities that you previously enjoyed. Frequent crying spells. Talk with your health care provider if you think that you are experiencing any of these symptoms. General instructions See your health care provider for regular wellness exams and vaccines. This may include: Scheduling regular health, dental, and eye exams. Getting and maintaining your vaccines. These include: Influenza vaccine. Get this vaccine each year before the flu season begins. Pneumonia vaccine. Shingles vaccine. Tetanus, diphtheria, and pertussis (Tdap) booster vaccine. Your health care provider may also recommend other immunizations. Tell your health care provider if you have ever been  abused or do not feel safe at home. Summary Menopause is a normal process in which your ability to get pregnant comes to an end. This condition causes hot flashes, night sweats, decreased interest in sex, mood swings, headaches, or lack of sleep. Treatment for this condition may include hormone replacement therapy. Take actions to keep yourself healthy, including exercising regularly, eating a healthy diet, watching your weight, and checking your blood pressure and blood sugar levels. Get screened for cancer and depression. Make sure that you are up to date with all your vaccines. This information is not intended to replace advice given to you by your health care provider. Make sure you discuss any questions you have with your health care provider. Document Revised: 10/31/2020 Document Reviewed: 10/31/2020 Elsevier Patient Education  [place].

## 2021-11-15 NOTE — Assessment & Plan Note (Addendum)
Chronic, stable on full dose rosuvastatin-continue this. The 10-year ASCVD risk score (Arnett DK, et al., 2019) is: 6.8%   Values used to calculate the score:     Age: 61 years     Sex: Female     Is Non-Hispanic African American: No     Diabetic: Yes     Tobacco smoker: No     Systolic Blood Pressure: 797 mmHg     Is BP treated: Yes     HDL Cholesterol: 39.3 mg/dL     Total Cholesterol: 164 mg/dL

## 2021-11-15 NOTE — Assessment & Plan Note (Signed)
Advanced directive - does not have this. Would want son Kathleen Good to be HCPOA. Packet provided today.

## 2021-11-15 NOTE — Assessment & Plan Note (Signed)
Stable readings on monthly B12 shots which she administers at home as well as daily B12 tablets OTC.

## 2021-11-15 NOTE — Assessment & Plan Note (Signed)
Chronic, stable on vitamin D 2000 units daily-continue this.

## 2021-11-15 NOTE — Assessment & Plan Note (Signed)
Chronic, stable on current regimen-continue metoprolol and diltiazem, and hydrochlorothiazide.  Consider ACE/ARB

## 2021-11-15 NOTE — Assessment & Plan Note (Signed)
Congratulated on weight loss to date.  Continue Ozempic, increasing dose to 1 mg weekly.

## 2021-11-15 NOTE — Assessment & Plan Note (Signed)
Chronic, stable on current regimen of glimepiride 4 mg daily with breakfast, Actos 30 mg daily and Ozempic 0.5 mg weekly, with latest A1c 7.4% which is an increase from prior.  Will increase Ozempic to 1 mg weekly.  Reassess at diabetes follow-up in 6 months.

## 2021-11-15 NOTE — Progress Notes (Signed)
Patient ID: Kathleen Good, female    DOB: 27-Jul-1960, 61 y.o.   MRN: [id number]  This visit was conducted in person.  BP 122/76   Pulse 85   Temp 97.9 F (36.6 C) (Temporal)   Ht 5' 4.25" (1.632 m)   Wt 287 lb 4 oz (130.3 kg)   LMP 05/21/2014   SpO2 97%   BMI 48.92 kg/m    CC: CPE Subjective:   HPI: Kathleen Good is a 61 y.o. female presenting on 11/15/2021 for Annual Exam   Husband retired and stopped driving due to early onset FTD.    Difficulty getting ozempic filled this past year. She was off it for 8 months (until Feb this year).   Preventative: COLONOSCOPY Date: 12/11/2011 hyperplastic polyp, rpt 5 yrs given fmhx - father with colon cancer ager 45yo Gatha Mayer). OVERDUE. Did not return iFOB last year. Desires to postpone colonoscopy due to family situation. Agrees to retry sample this year.  Well woman exam with PCP - pap ASCUS 09/2020, neg HPV LMP 04/2014  Recent mammogram through [facility] in [place] - completed last week. results pending.  Lung cancer screening - not eligible.  Flu - yearly [place] 09/2019 x2, booster 03/2021 Pneumovax 2015  Tdap - 11/2010, Td 11/2019 Shingrix - 03/2019, 06/2019 Advanced directive - does not have this. Would want son Bertis Ruddy to be HCPOA. Packet provided today.  Seat belt use discussed Sunscreen use discussed. No suspicious moles. Ex smoker - quit 2014, 15 PY history. Restarted 2022 <1ppd, quit 02/2021.  Alcohol - rare  Dentist - last seen 2021 Eye exam - 2021, due   Lives with husband and cats and dogs Occupation: housewife Activity: walk dogs but limited by back pain Diet: good water, some fruits/vegetables     Relevant past medical, surgical, family and social history reviewed and updated as indicated. Interim medical history since our last visit reviewed. Allergies and medications reviewed and updated. Outpatient Medications Prior to Visit  Medication Sig Dispense Refill    albuterol (PROVENTIL) (2.5 MG/3ML) 0.083% nebulizer solution Take 3 mLs (2.5 mg total) by nebulization every 6 (six) hours as needed for wheezing or shortness of breath. 150 mL 1   aspirin EC 81 MG tablet Take 81 mg by mouth daily with supper.     baclofen (LIORESAL) 10 MG tablet TAKE ONE TABLET BY MOUTH THREE TIMES A DAY 30 tablet 0   Blood Glucose Monitoring Suppl KIT Use to check sugar once daily 2 hours after a meal. Dx: E11.65 **Dispense per insurance guidelines** 1 each 0   Cholecalciferol (VITAMIN D) 50 MCG (2000 UT) CAPS Take 1 capsule (2,000 Units total) by mouth daily. 30 capsule    clobetasol ointment (TEMOVATE) 6.73 % Apply 1 application topically 2 (two) times daily. As needed 60 g 1   Coenzyme Q10 ([employer] COQ-10) 200 MG capsule Take 1 capsule (200 mg total) by mouth daily.     Cyanocobalamin (B-12) 1000 MCG SUBL Place 1 tablet under the tongue daily. 30 each    diphenhydramine-acetaminophen (TYLENOL PM) 25-500 MG TABS tablet Take 2 tablets by mouth at bedtime as needed (pain).     hydrochlorothiazide (HYDRODIURIL) 25 MG tablet TAKE ONE TABLET BY MOUTH DAILY AS NEEDED FOR LEG SWELLING 30 tablet 0   hydrOXYzine (ATARAX) 25 MG tablet TAKE ONE TABLET BY MOUTH THREE TIMES A DAY AS NEEDED FOR ANXIETY 40 tablet 0   Magnesium 200 MG TABS Take 1 tablet (  200 mg total) by mouth daily. 30 tablet    Melatonin 5 MG TABS Take 1 tablet by mouth at bedtime as needed.     meloxicam (MOBIC) 15 MG tablet TAKE ONE TABLET BY MOUTH DAILY 30 tablet 0   naproxen sodium (ALEVE) 220 MG tablet Take 220 mg by mouth daily as needed. Takes 2 tablets     Needle, Disp, 18G X 1.25" MISC 1 Units by Does not apply route every 30 (thirty) days. For b12 injection 10 each 1   NEEDLE, DISP, 25 G 25G X 1" MISC Use as directed - 1 needle monthly for b12 shot administration 10 each 1   OneTouch Delica Lancets 56L MISC Use to check sugar twice daily as needed. Dispense based on insurance preference. Dx: E11.65 180 each 3    ONETOUCH ULTRA test strip USE ONE STRIP TO TEST TWICE A DAY 200 strip 3   buPROPion (WELLBUTRIN SR) 150 MG 12 hr tablet TAKE ONE TABLET BY MOUTH TWICE A DAY 60 tablet 0   busPIRone (BUSPAR) 7.5 MG tablet TAKE ONE TABLET BY MOUTH EVERY NIGHT AT BEDTIME 30 tablet 0   citalopram (CELEXA) 10 MG tablet TAKE ONE TABLET BY MOUTH DAILY 90 tablet 0   cyanocobalamin (,VITAMIN B-12,) 1000 MCG/ML injection INJECT 1 ML INTO MUSCLE ONCE MONTHLY 1 mL 0   diltiazem (TIAZAC) 180 MG 24 hr capsule TAKE ONE CAPSULE BY MOUTH DAILY 30 capsule 0   doxycycline (VIBRA-TABS) 100 MG tablet Take 1 tablet (100 mg total) by mouth 2 (two) times daily. 14 tablet 0   glimepiride (AMARYL) 4 MG tablet TAKE ONE TABLET BY MOUTH DAILY WITH BREAKFAST**THIS IS AN INCREASED DOSE 90 tablet 0   metoprolol succinate (TOPROL-XL) 100 MG 24 hr tablet TAKE ONE TABLET BY MOUTH TWICE A DAY WITH OR IMMEDIATELY FOLLOWING A MEAL 180 tablet 1   omeprazole (PRILOSEC) 40 MG capsule TAKE ONE CAPSULE BY MOUTH TWICE A DAY 180 capsule 0   OZEMPIC, 0.25 OR 0.5 MG/DOSE, 2 MG/3ML SOPN DIAL AND INJECT UNDER THE SKIN 0.5 MG WEEKLY 3 mL 0   pioglitazone (ACTOS) 30 MG tablet TAKE 1 TABLET BY MOUTH DAILY. 90 tablet 0   propranolol (INDERAL) 20 MG tablet Take 1 tablet (20 mg total) by mouth 3 (three) times daily as needed. (Patient taking differently: Take 20 mg by mouth 3 (three) times daily as needed (heart palpitations).) 90 tablet 3   rosuvastatin (CRESTOR) 40 MG tablet TAKE ONE TABLET BY MOUTH DAILY 30 tablet 0   Semaglutide,0.25 or 0.5MG/DOS, (OZEMPIC, 0.25 OR 0.5 MG/DOSE,) 2 MG/1.5ML SOPN DIAL AND INJECT UNDER THE SKIN 0.5 MG WEEKLY 1.5 mL 0   traZODone (DESYREL) 50 MG tablet TAKE 1/2 TO 1 TABLET BY MOUTH EVERY NIGHT AT BEDTIME AS NEEDED FOR SLEEP 30 tablet 0   azithromycin (ZITHROMAX) 250 MG tablet 2 tab po x 1 day then 1 tab po daily 6 tablet 0   No facility-administered medications prior to visit.     Per HPI unless specifically indicated in ROS section  below Review of Systems  Constitutional:  Negative for activity change, appetite change, chills, fatigue, fever and unexpected weight change.  HENT:  Negative for hearing loss.   Eyes:  Negative for visual disturbance.  Respiratory:  Negative for cough, chest tightness, shortness of breath and wheezing.   Cardiovascular:  Positive for leg swelling. Negative for chest pain and palpitations.  Gastrointestinal:  Positive for constipation. Negative for abdominal distention, abdominal pain, blood in stool, diarrhea,  nausea and vomiting.  Genitourinary:  Negative for difficulty urinating and hematuria.  Musculoskeletal:  Negative for arthralgias, myalgias and neck pain.  Skin:  Negative for rash.  Neurological:  Positive for headaches. Negative for dizziness, seizures and syncope.  Hematological:  Negative for adenopathy. Does not bruise/bleed easily.  Psychiatric/Behavioral:  Positive for dysphoric mood. The patient is not nervous/anxious.    Objective:  BP 122/76   Pulse 85   Temp 97.9 F (36.6 C) (Temporal)   Ht 5' 4.25" (1.632 m)   Wt 287 lb 4 oz (130.3 kg)   LMP 05/21/2014   SpO2 97%   BMI 48.92 kg/m   Wt Readings from Last 3 Encounters:  11/15/21 287 lb 4 oz (130.3 kg)  08/18/21 296 lb (134.3 kg)  10/05/20 295 lb 5 oz (134 kg)      Physical Exam Vitals and nursing note reviewed.  Constitutional:      Appearance: Normal appearance. She is not ill-appearing.  HENT:     Head: Normocephalic and atraumatic.     Right Ear: Tympanic membrane, ear canal and external ear normal. There is no impacted cerumen.     Left Ear: Tympanic membrane, ear canal and external ear normal. There is no impacted cerumen.  Eyes:     General:        Right eye: No discharge.        Left eye: No discharge.     Extraocular Movements: Extraocular movements intact.     Conjunctiva/sclera: Conjunctivae normal.     Pupils: Pupils are equal, round, and reactive to light.  Neck:     Thyroid: No thyroid  mass or thyromegaly.     Vascular: No carotid bruit.  Cardiovascular:     Rate and Rhythm: Normal rate and regular rhythm.     Pulses: Normal pulses.     Heart sounds: Normal heart sounds. No murmur heard. Pulmonary:     Effort: Pulmonary effort is normal. No respiratory distress.     Breath sounds: Normal breath sounds. No wheezing, rhonchi or rales.  Abdominal:     General: Bowel sounds are normal. There is no distension.     Palpations: Abdomen is soft. There is no mass.     Tenderness: There is no abdominal tenderness. There is no guarding or rebound.     Hernia: No hernia is present.  Musculoskeletal:     Cervical back: Normal range of motion and neck supple. No rigidity.     Right lower leg: No edema.     Left lower leg: No edema.  Lymphadenopathy:     Cervical: No cervical adenopathy.  Skin:    General: Skin is warm and dry.     Findings: No rash.  Neurological:     General: No focal deficit present.     Mental Status: She is alert. Mental status is at baseline.  Psychiatric:        Mood and Affect: Mood normal.        Behavior: Behavior normal.      Results for orders placed or performed in visit on 11/08/21  Microalbumin / creatinine urine ratio  Result Value Ref Range   Microalb, Ur 2.8 (H) 0.0 - 1.9 mg/dL   Creatinine,U 197.6 mg/dL   Microalb Creat Ratio 1.4 0.0 - 30.0 mg/g  Hemoglobin A1c  Result Value Ref Range   Hgb A1c MFr Bld 7.4 (H) 4.6 - 6.5 %  Lipid panel  Result Value Ref Range   Cholesterol 164  0 - 200 mg/dL   Triglycerides 215.0 (H) 0.0 - 149.0 mg/dL   HDL 39.30 >39.00 mg/dL   VLDL 43.0 (H) 0.0 - 40.0 mg/dL   Total CHOL/HDL Ratio 4    NonHDL 124.30   Comprehensive metabolic panel  Result Value Ref Range   Sodium 138 135 - 145 mEq/L   Potassium 4.1 3.5 - 5.1 mEq/L   Chloride 105 96 - 112 mEq/L   CO2 24 19 - 32 mEq/L   Glucose, Bld 160 (H) 70 - 99 mg/dL   BUN 13 6 - 23 mg/dL   Creatinine, Ser 1.01 0.40 - 1.20 mg/dL   Total Bilirubin 0.3 0.2  - 1.2 mg/dL   Alkaline Phosphatase 67 39 - 117 U/L   AST 37 0 - 37 U/L   ALT 36 (H) 0 - 35 U/L   Total Protein 7.1 6.0 - 8.3 g/dL   Albumin 4.0 3.5 - 5.2 g/dL   GFR 61.22 >60.00 mL/min   Calcium 8.8 8.4 - 10.5 mg/dL  VITAMIN D 25 Hydroxy (Vit-D Deficiency, Fractures)  Result Value Ref Range   VITD 52.20 30.00 - 100.00 ng/mL  Vitamin B12  Result Value Ref Range   Vitamin B-12 759 211 - 911 pg/mL  LDL cholesterol, direct  Result Value Ref Range   Direct LDL 93.0 mg/dL      11/15/2021    4:34 PM 10/05/2020   12:19 PM 08/23/2020    9:15 AM 03/11/2020    8:33 AM 09/21/2019    9:47 AM  Depression screen PHQ 2/9  Decreased Interest _0 0  Down, Depressed, Hopeless _1 PHQ - 2 Score _2 Altered sleeping _3 Tired, decreased energy _4 Change in appetite _5 Feeling bad or failure about yourself  0 0 0 0 0  Trouble concentrating 0 0 1 2 0  Moving slowly or fidgety/restless 0 0 0 0 0  Suicidal thoughts 0 0 0 0 0  PHQ-9 Score _6 Difficult doing work/chores Somewhat difficult           11/15/2021    4:34 PM 10/05/2020   12:20 PM 08/23/2020    9:15 AM 03/11/2020    8:33 AM  GAD 7 : Generalized Anxiety Score  Nervous, Anxious, on Edge _7 Control/stop worrying _8 Worry too much - different things _9 Trouble relaxing _10 Restless 0 0 1 0  Easily annoyed or irritable _11 Afraid - awful might happen _12 Total GAD 7 Score _13 Anxiety Difficulty Somewhat difficult      Assessment & Plan:   Problem List Items Addressed This Visit     Healthcare maintenance - Primary (Chronic)    Preventative protocols reviewed and updated unless pt declined. Discussed healthy diet and lifestyle.        Advance directive discussed with patient (Chronic)    Advanced directive - does not have this. Would want son Bertis Ruddy to be HCPOA. Packet provided today.       Smoker    Again quit smoking 02/2021 -  congratulated!  approx 15 PY hx, not eligible for lung cancer screening.        MDD (major depressive disorder),  recurrent episode, moderate (HCC)    Chronic, difficult period due to recent family illness.  Overall stable on current regimen which includes Wellbutrin, Celexa, trazodone- we will continue this.       Relevant Medications   buPROPion (WELLBUTRIN SR) 150 MG 12 hr tablet   busPIRone (BUSPAR) 7.5 MG tablet   citalopram (CELEXA) 10 MG tablet   traZODone (DESYREL) 50 MG tablet   GERD    Chronic, stable on omeprazole 40 mg twice daily-continue this.       Relevant Medications   omeprazole (PRILOSEC) 40 MG capsule   Controlled diabetes mellitus type 2 with complications (HCC)    Chronic, stable on current regimen of glimepiride 4 mg daily with breakfast, Actos 30 mg daily and Ozempic 0.5 mg weekly, with latest A1c 7.4% which is an increase from prior.  Will increase Ozempic to 1 mg weekly.  Reassess at diabetes follow-up in 6 months.       Relevant Medications   glimepiride (AMARYL) 4 MG tablet   pioglitazone (ACTOS) 30 MG tablet   rosuvastatin (CRESTOR) 40 MG tablet   Semaglutide, 1 MG/DOSE, 4 MG/3ML SOPN   Dyslipidemia associated with type 2 diabetes mellitus (HCC)    Chronic, stable on full dose rosuvastatin-continue this. The 10-year ASCVD risk score (Arnett DK, et al., 2019) is: 6.8%   Values used to calculate the score:     Age: 35 years     Sex: Female     Is Non-Hispanic African American: No     Diabetic: Yes     Tobacco smoker: No     Systolic Blood Pressure: 510 mmHg     Is BP treated: Yes     HDL Cholesterol: 39.3 mg/dL     Total Cholesterol: 164 mg/dL       Relevant Medications   glimepiride (AMARYL) 4 MG tablet   pioglitazone (ACTOS) 30 MG tablet   rosuvastatin (CRESTOR) 40 MG tablet   Semaglutide, 1 MG/DOSE, 4 MG/3ML SOPN   HYPERTENSION, BENIGN    Chronic, stable on current regimen-continue metoprolol and diltiazem, and hydrochlorothiazide.   Consider ACE/ARB       Relevant Medications   diltiazem (TIAZAC) 180 MG 24 hr capsule   metoprolol succinate (TOPROL-XL) 100 MG 24 hr tablet   rosuvastatin (CRESTOR) 40 MG tablet   Obesity, morbid, BMI 40.0-49.9 ([place])    Congratulated on weight loss to date.  Continue Ozempic, increasing dose to 1 mg weekly.       Relevant Medications   glimepiride (AMARYL) 4 MG tablet   pioglitazone (ACTOS) 30 MG tablet   Semaglutide, 1 MG/DOSE, 4 MG/3ML SOPN   Family history of malignant neoplasm of gastrointestinal tract    Encouraged colonoscopy but she prefers to start with stool kit.       Vitamin B12 deficiency    Stable readings on monthly B12 shots which she administers at home as well as daily B12 tablets OTC.       Vitamin D deficiency    Chronic, stable on vitamin D 2000 units daily-continue this.       ASCUS of cervix with negative high risk HPV     Meds ordered this encounter  Medications   buPROPion (WELLBUTRIN SR) 150 MG 12 hr tablet    Sig: Take 1 tablet (150 mg total) by mouth 2 (two) times daily.    Dispense:  180 tablet    Refill:  3   busPIRone (BUSPAR) 7.5 MG tablet    Sig: Take  1 tablet (7.5 mg total) by mouth at bedtime.    Dispense:  90 tablet    Refill:  3   citalopram (CELEXA) 10 MG tablet    Sig: Take 1 tablet (10 mg total) by mouth daily.    Dispense:  90 tablet    Refill:  3   cyanocobalamin (,VITAMIN B-12,) 1000 MCG/ML injection    Sig: INJECT 1 ML INTO MUSCLE ONCE MONTHLY    Dispense:  1 mL    Refill:  11   diltiazem (TIAZAC) 180 MG 24 hr capsule    Sig: Take 1 capsule (180 mg total) by mouth daily.    Dispense:  90 capsule    Refill:  3   glimepiride (AMARYL) 4 MG tablet    Sig: Take 1 tablet (4 mg total) by mouth daily with breakfast.    Dispense:  90 tablet    Refill:  3   metoprolol succinate (TOPROL-XL) 100 MG 24 hr tablet    Sig: Take 1 tablet (100 mg total) by mouth in the morning and at bedtime. Take with or immediately following a  meal.    Dispense:  180 tablet    Refill:  3   omeprazole (PRILOSEC) 40 MG capsule    Sig: Take 1 capsule (40 mg total) by mouth 2 (two) times daily.    Dispense:  180 capsule    Refill:  3   pioglitazone (ACTOS) 30 MG tablet    Sig: Take 1 tablet (30 mg total) by mouth daily.    Dispense:  90 tablet    Refill:  3   rosuvastatin (CRESTOR) 40 MG tablet    Sig: Take 1 tablet (40 mg total) by mouth daily.    Dispense:  90 tablet    Refill:  3   traZODone (DESYREL) 50 MG tablet    Sig: Take 1 tablet (50 mg total) by mouth at bedtime as needed for sleep.    Dispense:  30 tablet    Refill:  6   Semaglutide, 1 MG/DOSE, 4 MG/3ML SOPN    Sig: Inject 1 mg as directed once a week.    Dispense:  3 mL    Refill:  11   No orders of the defined types were placed in this encounter.    Patient instructions: Pass by lab to pick up stool kit.  You are doing well today Congratulations on weight loss to date! Advanced directive packet provided today Try higher ozempic dose 57m weekly.  Return in 6 months for diabetes follow up visit.   Follow up plan: Return in about 6 months (around 05/18/2022) for follow up visit.  JRia Bush MD

## 2021-11-15 NOTE — Assessment & Plan Note (Signed)
Again quit smoking 02/2021 - congratulated!  approx 15 PY hx, not eligible for lung cancer screening.

## 2021-11-16 ENCOUNTER — Telehealth: Payer: Self-pay | Admitting: Family Medicine

## 2021-11-16 NOTE — Telephone Encounter (Signed)
Plz notify I received mammogram from [facility] done 11/10/2021 It was read as incomplete as the radiologist said she needed prior images to compare. I recommend she call [facility] at [phone] to ensure they got needed prior images from the [facility] so the radiologist can read her new mammogram.

## 2021-11-17 NOTE — Telephone Encounter (Signed)
Attempted to contact pt.  Vm box is full.  Need to relay Dr. Synthia Innocent message.

## 2021-11-21 NOTE — Telephone Encounter (Signed)
Patient notified as instructed by telephone and verbalized understanding. Patient stated that she will contact [facility] now.

## 2021-11-24 ENCOUNTER — Encounter: Payer: Self-pay | Admitting: Family Medicine

## 2021-12-01 ENCOUNTER — Encounter: Payer: Self-pay | Admitting: Family Medicine

## 2021-12-01 NOTE — Telephone Encounter (Signed)
Spoke with pt offering OV, however, pt states she discuss chronic pain issues with Dr. Darnell Level at CPE.  Notified her I will fwd message to Dr. Darnell Level but that he may recommend an OV.  Pt verbalizes understanding.

## 2021-12-06 ENCOUNTER — Other Ambulatory Visit (INDEPENDENT_AMBULATORY_CARE_PROVIDER_SITE_OTHER): Payer: 59

## 2021-12-06 DIAGNOSIS — Z1211 Encounter for screening for malignant neoplasm of colon: Secondary | ICD-10-CM | POA: Diagnosis not present

## 2021-12-07 LAB — FECAL OCCULT BLOOD, IMMUNOCHEMICAL: Fecal Occult Bld: NEGATIVE

## 2021-12-09 ENCOUNTER — Other Ambulatory Visit: Payer: Self-pay | Admitting: Family Medicine

## 2021-12-11 NOTE — Telephone Encounter (Signed)
Baclofen last filled:  09/27/21, #30 Meloxicam last filled:  04/24/21, #30 Last OV:  11/15/21, CPE Next OV:  none

## 2021-12-22 ENCOUNTER — Other Ambulatory Visit: Payer: Self-pay | Admitting: Family Medicine

## 2021-12-22 NOTE — Telephone Encounter (Signed)
Last filled 12/14/21 Last ov05/24/23

## 2021-12-25 ENCOUNTER — Other Ambulatory Visit: Payer: Self-pay | Admitting: Family Medicine

## 2021-12-25 NOTE — Telephone Encounter (Signed)
Spoke to pt to find out how she is taking the medication and if this was an auto refill or her request. She said it was an auto refill and that she only takes about 1 per day. I advised her I will deny this refill and if she needs a refill this week to send a message on how she is taking it.

## 2021-12-25 NOTE — Telephone Encounter (Signed)
ERx 

## 2022-01-11 ENCOUNTER — Other Ambulatory Visit: Payer: Self-pay | Admitting: Family Medicine

## 2022-01-11 NOTE — Telephone Encounter (Signed)
Last office visit 11/15/21 for CPE.  Last refilled 06/21/49f or #30 with no refills.  No future appointments.  Ok to refill?

## 2022-01-12 NOTE — Telephone Encounter (Signed)
Opened in error

## 2022-01-22 ENCOUNTER — Other Ambulatory Visit: Payer: Self-pay | Admitting: Family Medicine

## 2022-01-22 NOTE — Telephone Encounter (Signed)
Refill request hydroxyzine Last refill 12/11/21 #40 Last office visit 11/15/21

## 2022-02-16 ENCOUNTER — Encounter: Payer: Self-pay | Admitting: Family Medicine

## 2022-02-19 ENCOUNTER — Ambulatory Visit (INDEPENDENT_AMBULATORY_CARE_PROVIDER_SITE_OTHER): Payer: 59 | Admitting: Family Medicine

## 2022-02-19 ENCOUNTER — Encounter: Payer: Self-pay | Admitting: Family Medicine

## 2022-02-19 VITALS — BP 116/72 | HR 86 | Temp 97.2°F | Ht 64.25 in | Wt 282.0 lb

## 2022-02-19 DIAGNOSIS — L0390 Cellulitis, unspecified: Secondary | ICD-10-CM | POA: Diagnosis not present

## 2022-02-19 MED ORDER — SULFAMETHOXAZOLE-TRIMETHOPRIM 800-160 MG PO TABS: 1.0000 | ORAL_TABLET | Freq: Two times a day (BID) | ORAL | 0 refills | Status: DC

## 2022-02-19 NOTE — Telephone Encounter (Signed)
Tried to call patient and unable to leave a message because mail box was. Will send a message to patient to call the office and schedule an appointment.

## 2022-02-19 NOTE — Progress Notes (Unsigned)
No blood thinners or abx currently.  Noted sore area in the last week.  Progressive enlargement.  On her back.  Sore locally.  Had a fever over the weekend, 101.  5x3 cm area redness on the upper back.     Skin exam only.

## 2022-02-19 NOTE — Patient Instructions (Signed)
Start septra, take it twice a day.  Update [place] in a few days.   If more fevers, spreading redness, or more pain then get rechecked.  Take care.  Glad to see you.

## 2022-02-21 DIAGNOSIS — L0390 Cellulitis, unspecified: Secondary | ICD-10-CM | POA: Insufficient documentation

## 2022-02-21 NOTE — Assessment & Plan Note (Signed)
Okay for patient follow-up.  Border marked.  I asked patient to monitor the edge and update [place] if she is getting worse, including fever, spreading redness, etc.  Start Septra.  It does not appear that she would benefit from incision and drainage and rationale discussed with patient.  She agrees with plan.

## 2022-02-22 ENCOUNTER — Ambulatory Visit: Payer: 59 | Admitting: Family Medicine

## 2022-02-22 ENCOUNTER — Encounter: Payer: Self-pay | Admitting: Family Medicine

## 2022-02-22 NOTE — Progress Notes (Unsigned)
   Subjective:    Patient ID: Kathleen Good, female    DOB: 02/23/1961, 61 y.o.   MRN: [id number]  HPI 61 yo pt of Dr Darnell Level presents with skin infection on upper backshe has h/o diabetes    Was seen by Dr Damita Dunnings on 8/28  Area was marked , she was px septra   Chart notes a h/o chronic folliculitis  She has a h/o true allergy to pcn  Has taken azithro and doxy before     Review of Systems     Objective:   Physical Exam        Assessment & Plan:

## 2022-03-12 ENCOUNTER — Other Ambulatory Visit: Payer: Self-pay | Admitting: Family Medicine

## 2022-03-16 ENCOUNTER — Telehealth: Payer: Self-pay

## 2022-03-16 NOTE — Telephone Encounter (Signed)
Prior auth for Ozempic (1 MG/DOSE) '4MG'$ /3ML pen-injectors has been approved. Kathleen Good (Key: [id number]) Rx #: [id number]  Your request for Ozempic Inj '4mg'$ /52m has been approved. How long does this approval last? OZEMPIC INJ '4MG'$ /3ML, use as directed, is approved through 03/17/2023.  Approval letter sent to scanning.  Patient notified via mychart.

## 2022-03-16 NOTE — Telephone Encounter (Signed)
Prior auth started for Ozempic (1 MG/DOSE) '4MG'$ /3ML pen-injectors. Kathleen Good (Key: [id number]) Rx #: [id number] Waiting for determination.

## 2022-03-19 ENCOUNTER — Other Ambulatory Visit: Payer: Self-pay | Admitting: Family Medicine

## 2022-03-20 NOTE — Telephone Encounter (Signed)
Baclofen last filled:  02/19/22, #30 Hydroxyzine last filled:  01/24/22, #40 Meloxicam last filled:  01/12/22, #30 Last OV:  11/15/21, CPE Next OV:  none

## 2022-04-05 ENCOUNTER — Encounter: Payer: Self-pay | Admitting: Family Medicine

## 2022-04-05 DIAGNOSIS — K760 Fatty (change of) liver, not elsewhere classified: Secondary | ICD-10-CM

## 2022-04-10 NOTE — Telephone Encounter (Signed)
Morning!  Can you reach out to patient to go over possible patient assistance program eligibility? She's on Ozempic '1mg'$  weekly, has metformin intolerance.  Already on Amaryl '4mg'$  daily.  Lab Results  Component Value Date   HGBA1C 7.4 (H) 11/08/2021

## 2022-04-10 NOTE — Telephone Encounter (Signed)
Unfortunately the assistance programs are only for Medicare or uninsured patients. I can get her a copay card for Ozempic but it will only take off $150 per fill - based on her $900 copay I don't think that will be very helpful. The other GLP-1 RA and Mounjaro are probably all in the same boat.   It looks like DPP-IV will be the cheapest - she specifically mentioned Onglyza was $220, but it looks like Lady Gary is also a preferred DPP-IV (likely very similar price) and I can get her a Tradjenta copay card for $150 off so her cost would be around $70 (I typically recommend Tradjenta over Onglyza anyway, due to safety data).  Let me know if you think Tradjenta is an option and I will get the copay card.

## 2022-04-23 NOTE — Telephone Encounter (Signed)
I think that may be a good option -I've sent pt a mychart message to see if she agrees. Would insulin be any cheaper? Otherwise we may need to retry metformin (listed as intolerance due to GI upset).

## 2022-05-10 DIAGNOSIS — K760 Fatty (change of) liver, not elsewhere classified: Secondary | ICD-10-CM | POA: Insufficient documentation

## 2022-05-10 MED ORDER — PIOGLITAZONE HCL 15 MG PO TABS: 15.0000 mg | ORAL_TABLET | Freq: Every day | ORAL | 11 refills | Status: DC

## 2022-05-10 NOTE — Addendum Note (Signed)
Addended by: Ria Bush on: 05/10/2022 12:55 PM   Modules accepted: Orders

## 2022-05-12 ENCOUNTER — Other Ambulatory Visit: Payer: Self-pay | Admitting: Family Medicine

## 2022-05-15 ENCOUNTER — Other Ambulatory Visit: Payer: Self-pay

## 2022-05-18 ENCOUNTER — Encounter: Payer: Self-pay | Admitting: Family Medicine

## 2022-05-18 NOTE — Telephone Encounter (Signed)
Tried to reach patient by phone sent to voice mail and it was full. Sent my chart message recommending that patient get seen at urgent care/ walk in over weekend and to call our office Monday.

## 2022-05-22 NOTE — Telephone Encounter (Addendum)
Agree with recommendations.  

## 2022-06-19 ENCOUNTER — Other Ambulatory Visit: Payer: Self-pay | Admitting: Family Medicine

## 2022-07-10 ENCOUNTER — Encounter: Payer: Self-pay | Admitting: Family Medicine

## 2022-07-10 ENCOUNTER — Ambulatory Visit (INDEPENDENT_AMBULATORY_CARE_PROVIDER_SITE_OTHER): Payer: 59 | Admitting: Family Medicine

## 2022-07-10 VITALS — BP 120/66 | HR 74 | Temp 97.4°F | Ht 64.25 in | Wt 296.2 lb

## 2022-07-10 DIAGNOSIS — E118 Type 2 diabetes mellitus with unspecified complications: Secondary | ICD-10-CM | POA: Diagnosis not present

## 2022-07-10 DIAGNOSIS — F331 Major depressive disorder, recurrent, moderate: Secondary | ICD-10-CM

## 2022-07-10 DIAGNOSIS — H9201 Otalgia, right ear: Secondary | ICD-10-CM | POA: Insufficient documentation

## 2022-07-10 LAB — POCT GLYCOSYLATED HEMOGLOBIN (HGB A1C): Hemoglobin A1C: 8.3 % — AB (ref 4.0–5.6)

## 2022-07-10 MED ORDER — DOXYCYCLINE HYCLATE 100 MG PO TABS: 100.0000 mg | ORAL_TABLET | Freq: Two times a day (BID) | ORAL | 0 refills | Status: DC

## 2022-07-10 MED ORDER — OZEMPIC (0.25 OR 0.5 MG/DOSE) 2 MG/1.5ML ~~LOC~~ SOPN: PEN_INJECTOR | SUBCUTANEOUS | 1 refills | Status: AC

## 2022-07-10 MED ORDER — MUPIROCIN 2 % EX OINT: 1.0000 | TOPICAL_OINTMENT | Freq: Two times a day (BID) | CUTANEOUS | 0 refills | Status: DC

## 2022-07-10 NOTE — Assessment & Plan Note (Signed)
Exam with mild redness and swelling at entrance to R ear canal with what seems like hyperkeratotic core - ?wart vs other. In recent cat scratch, will treat as infection with mupirocin ointment/cream and doxycycline 1 wk course. Update if not improving with treatment.

## 2022-07-10 NOTE — Progress Notes (Signed)
Patient ID: Kathleen Good, female    DOB: December 14, 1960, 62 y.o.   MRN: [id number]  This visit was conducted in person.  BP 120/66   Pulse 74   Temp (!) 97.4 F (36.3 C) (Temporal)   Ht 5' 4.25" (1.632 m)   Wt 296 lb 4 oz (134.4 kg)   LMP  (LMP Unknown) Comment: one in past 2 years  SpO2 97%   BMI 50.46 kg/m    CC: R ear pain and swelling Subjective:   HPI: Kathleen Good is a 62 y.o. female presenting on 07/10/2022 for Ear Pain (C/o R ear pain and swelling. Sxs started 07/05/22.)   5d h/o R ear pain and swelling. More puffy in the mornings.  No fevers/chills, streaking redness, hearing changes, tinnitus, or congestion, ST.   Over the past month she killed bug at R ear and may have got piece of bug into ear canal.  Last week her cat also had scratched just inside R ear.   Known diabetic on glimepiride '4mg'$  daily, pioglitazone '15mg'$  daily. Metformin intolerance (GI upset). Requests refill of ozempic to see if covered.  Lab Results  Component Value Date   HGBA1C 8.3 (A) 07/10/2022        Relevant past medical, surgical, family and social history reviewed and updated as indicated. Interim medical history since our last visit reviewed. Allergies and medications reviewed and updated. Outpatient Medications Prior to Visit  Medication Sig Dispense Refill   albuterol (PROVENTIL) (2.5 MG/3ML) 0.083% nebulizer solution Take 3 mLs (2.5 mg total) by nebulization every 6 (six) hours as needed for wheezing or shortness of breath. 150 mL 1   aspirin EC 81 MG tablet Take 81 mg by mouth daily with supper.     baclofen (LIORESAL) 10 MG tablet TAKE ONE TABLET BY MOUTH THREE TIMES A DAY 30 tablet 01   Blood Glucose Monitoring Suppl KIT Use to check sugar once daily 2 hours after a meal. Dx: E11.65 **Dispense per insurance guidelines** 1 each 0   buPROPion (WELLBUTRIN SR) 150 MG 12 hr tablet Take 1 tablet (150 mg total) by mouth 2 (two) times daily. 180 tablet 3   busPIRone (BUSPAR) 7.5 MG tablet  Take 1 tablet (7.5 mg total) by mouth at bedtime. 90 tablet 3   Cholecalciferol (VITAMIN D) 50 MCG (2000 UT) CAPS Take 1 capsule (2,000 Units total) by mouth daily. 30 capsule    citalopram (CELEXA) 10 MG tablet Take 1 tablet (10 mg total) by mouth daily. 90 tablet 3   clobetasol ointment (TEMOVATE) 3.24 % Apply 1 application topically 2 (two) times daily. As needed 60 g 1   Coenzyme Q10 ([employer] COQ-10) 200 MG capsule Take 1 capsule (200 mg total) by mouth daily.     cyanocobalamin (,VITAMIN B-12,) 1000 MCG/ML injection INJECT 1 ML INTO MUSCLE ONCE MONTHLY 1 mL 11   diltiazem (TIAZAC) 180 MG 24 hr capsule Take 1 capsule (180 mg total) by mouth daily. 90 capsule 3   diphenhydramine-acetaminophen (TYLENOL PM) 25-500 MG TABS tablet Take 2 tablets by mouth at bedtime as needed (pain).     glimepiride (AMARYL) 4 MG tablet Take 1 tablet (4 mg total) by mouth daily with breakfast. 90 tablet 3   hydrochlorothiazide (HYDRODIURIL) 25 MG tablet TAKE 1 TABLET BY MOUTH DAILY FOR LEG SWELLING 90 tablet 0   hydrOXYzine (ATARAX) 25 MG tablet TAKE ONE TABLET BY MOUTH THREE TIMES A DAY AS NEEDED FOR ANXIETY 40 tablet 0  Magnesium 200 MG TABS Take 1 tablet (200 mg total) by mouth daily. 30 tablet    Melatonin 5 MG TABS Take 1 tablet by mouth at bedtime as needed.     meloxicam (MOBIC) 15 MG tablet TAKE 1 TABLET BY MOUTH DAILY 30 tablet 0   metoprolol succinate (TOPROL-XL) 100 MG 24 hr tablet Take 1 tablet (100 mg total) by mouth in the morning and at bedtime. Take with or immediately following a meal. 180 tablet 3   naproxen sodium (ALEVE) 220 MG tablet Take 220 mg by mouth daily as needed. Takes 2 tablets     Needle, Disp, 18G X 1.25" MISC 1 Units by Does not apply route every 30 (thirty) days. For b12 injection 10 each 1   NEEDLE, DISP, 25 G 25G X 1" MISC Use as directed - 1 needle monthly for b12 shot administration 10 each 1   omeprazole (PRILOSEC) 40 MG capsule Take 1 capsule (40 mg total) by mouth 2 (two) times  daily. 180 capsule 3   OneTouch Delica Lancets 99M MISC Use to check sugar twice daily as needed. Dispense based on insurance preference. Dx: E11.65 180 each 3   ONETOUCH ULTRA test strip USE ONE STRIP TO TEST TWICE A DAY 200 strip 3   pioglitazone (ACTOS) 15 MG tablet Take 1 tablet (15 mg total) by mouth daily. 30 tablet 11   rosuvastatin (CRESTOR) 40 MG tablet Take 1 tablet (40 mg total) by mouth daily. 90 tablet 3   traZODone (DESYREL) 50 MG tablet TAKE ONE TABLET BY MOUTH EVERY NIGHT AT BEDTIME AS NEEDED FOR SLEEP 30 tablet 0   sulfamethoxazole-trimethoprim (BACTRIM DS) 800-160 MG tablet Take 1 tablet by mouth 2 (two) times daily. 20 tablet 0   metFORMIN (GLUCOPHAGE) 500 MG tablet Take 1 tablet (500 mg total) by mouth 2 (two) times daily with a meal. 30 tablet 3   No facility-administered medications prior to visit.     Per HPI unless specifically indicated in ROS section below Review of Systems  Objective:  BP 120/66   Pulse 74   Temp (!) 97.4 F (36.3 C) (Temporal)   Ht 5' 4.25" (1.632 m)   Wt 296 lb 4 oz (134.4 kg)   LMP  (LMP Unknown) Comment: one in past 2 years  SpO2 97%   BMI 50.46 kg/m   Wt Readings from Last 3 Encounters:  07/10/22 296 lb 4 oz (134.4 kg)  02/19/22 282 lb (127.9 kg)  11/15/21 287 lb 4 oz (130.3 kg)      Physical Exam Vitals and nursing note reviewed.  Constitutional:      Appearance: Normal appearance. She is obese. She is not ill-appearing.  HENT:     Head: Normocephalic and atraumatic.     Right Ear: Hearing and tympanic membrane normal. No decreased hearing noted.     Left Ear: Hearing, tympanic membrane, ear canal and external ear normal. No decreased hearing noted.     Ears:      Comments: Tender swelling at entrance to R ear canal with hyperkeratotic core    Mouth/Throat:     Mouth: Mucous membranes are dry.     Pharynx: Oropharynx is clear. No oropharyngeal exudate.     Comments: Mildly dry MM Lymphadenopathy:     Head:     Right  side of head: No submental, submandibular, tonsillar, preauricular or posterior auricular adenopathy.     Left side of head: No submental, submandibular, tonsillar or preauricular adenopathy.  Neurological:  Mental Status: She is alert.  Psychiatric:        Mood and Affect: Mood normal.        Behavior: Behavior normal.       Results for orders placed or performed in visit on 07/10/22  POCT glycosylated hemoglobin (Hb A1C)  Result Value Ref Range   Hemoglobin A1C 8.3 (A) 4.0 - 5.6 %   HbA1c POC (<> result, manual entry)     HbA1c, POC (prediabetic range)     HbA1c, POC (controlled diabetic range)        07/10/2022   12:05 PM 11/15/2021    4:34 PM 10/05/2020   12:19 PM 08/23/2020    9:15 AM 03/11/2020    8:33 AM  Depression screen PHQ 2/9  Decreased Interest '1 1 2 3 1  '$ Down, Depressed, Hopeless '2 1 1 2 1  '$ PHQ - 2 Score '3 2 3 5 2  '$ Altered sleeping '2 2 3 3 3  '$ Tired, decreased energy '3 2 3 3 3  '$ Change in appetite '2 1 1 2 3  '$ Feeling bad or failure about yourself  1 0 0 0 0  Trouble concentrating 2 0 0 1 2  Moving slowly or fidgety/restless 0 0 0 0 0  Suicidal thoughts 0 0 0 0 0  PHQ-9 Score '13 7 10 14 13  '$ Difficult doing work/chores Very difficult Somewhat difficult          07/10/2022   12:13 PM 11/15/2021    4:34 PM 10/05/2020   12:20 PM 08/23/2020    9:15 AM  GAD 7 : Generalized Anxiety Score  Nervous, Anxious, on Edge '3 1 3 3  '$ Control/stop worrying '2 1 3 3  '$ Worry too much - different things '2 1 2 3  '$ Trouble relaxing '1 1 3 2  '$ Restless 0 0 0 1  Easily annoyed or irritable '3 2 3 3  '$ Afraid - awful might happen 0 '1 1 3  '$ Total GAD 7 Score '11 7 15 18  '$ Anxiety Difficulty Somewhat difficult Somewhat difficult     Assessment & Plan:   Problem List Items Addressed This Visit     MDD (major depressive disorder), recurrent episode, moderate ([facility])    Worsened mood questionairres noted. No changes today - continue wellbutrin SR '150mg'$  BID, celexa '10mg'$  daily, trazodone '50mg'$   nightly. Consider switch from celexa to lexapro.        Type 2 diabetes mellitus with unspecified complications (HCC)    Chronic, uncontrolled since ozempic became unaffordable. Update A1c. Continues actos, amaryl. Now with new insurance requests retrying ozempic - sent to pharmacy.       Relevant Medications   Semaglutide,0.25 or 0.'5MG'$ /DOS, (OZEMPIC, 0.25 OR 0.5 MG/DOSE,) 2 MG/1.5ML SOPN   Other Relevant Orders   POCT glycosylated hemoglobin (Hb A1C) (Completed)   Acute ear pain, right - Primary    Exam with mild redness and swelling at entrance to R ear canal with what seems like hyperkeratotic core - ?wart vs other. In recent cat scratch, will treat as infection with mupirocin ointment/cream and doxycycline 1 wk course. Update if not improving with treatment.         Meds ordered this encounter  Medications   Semaglutide,0.25 or 0.'5MG'$ /DOS, (OZEMPIC, 0.25 OR 0.5 MG/DOSE,) 2 MG/1.5ML SOPN    Sig: Inject 0.25 mg into the skin once a week for 14 days, THEN 0.5 mg once a week.    Dispense:  1.5 mL    Refill:  1  mupirocin ointment (BACTROBAN) 2 %    Sig: Apply 1 Application topically 2 (two) times daily. To affected are R external ear    Dispense:  22 g    Refill:  0    Formulate cream/ointment based on affordability/insurance preference   doxycycline (VIBRA-TABS) 100 MG tablet    Sig: Take 1 tablet (100 mg total) by mouth 2 (two) times daily.    Dispense:  14 tablet    Refill:  0    Orders Placed This Encounter  Procedures   POCT glycosylated hemoglobin (Hb A1C)    Patient Instructions  Ozempic sent to local pharmacy to price out.  For ear - possible skin infection /inflammation to external ear. Treat with warm compresses to the ear, use antibiotic mupirocin antibiotic cream/ointment, take antibiotic by mouth sent to pharmacy.  A1c today.  Return after May 24th for physical.   Follow up plan: Return in about 4 months (around 11/16/2022), or if symptoms worsen or fail to  improve, for annual exam, prior fasting for blood work.  Ria Bush, MD

## 2022-07-10 NOTE — Assessment & Plan Note (Signed)
Chronic, uncontrolled since ozempic became unaffordable. Update A1c. Continues actos, amaryl. Now with new insurance requests retrying ozempic - sent to pharmacy.

## 2022-07-10 NOTE — Patient Instructions (Addendum)
Ozempic sent to local pharmacy to price out.  For ear - possible skin infection /inflammation to external ear. Treat with warm compresses to the ear, use antibiotic mupirocin antibiotic cream/ointment, take antibiotic by mouth sent to pharmacy.  A1c today.  Return after May 24th for physical.

## 2022-07-10 NOTE — Assessment & Plan Note (Signed)
Worsened mood questionairres noted. No changes today - continue wellbutrin SR '150mg'$  BID, celexa '10mg'$  daily, trazodone '50mg'$  nightly. Consider switch from celexa to lexapro.

## 2022-07-23 ENCOUNTER — Other Ambulatory Visit: Payer: Self-pay | Admitting: Family Medicine

## 2022-07-23 DIAGNOSIS — F331 Major depressive disorder, recurrent, moderate: Secondary | ICD-10-CM

## 2022-07-24 NOTE — Telephone Encounter (Signed)
Baclofen Last filled: 05/12/22, #30 Last OV: 07/10/22, ear pain Next OV: none

## 2022-08-09 ENCOUNTER — Telehealth: Payer: Self-pay

## 2022-08-09 ENCOUNTER — Other Ambulatory Visit (HOSPITAL_COMMUNITY): Payer: Self-pay

## 2022-08-09 NOTE — Telephone Encounter (Signed)
Patient Advocate Encounter  Prior Authorization for Ozempic has been approved.    PA# [id number] A Effective dates: 08/07/22 through 08/08/23

## 2022-08-15 ENCOUNTER — Encounter: Payer: Self-pay | Admitting: Family Medicine

## 2022-09-01 ENCOUNTER — Other Ambulatory Visit: Payer: Self-pay | Admitting: Family Medicine

## 2022-09-01 DIAGNOSIS — F331 Major depressive disorder, recurrent, moderate: Secondary | ICD-10-CM

## 2022-09-03 NOTE — Telephone Encounter (Signed)
Refill request Hydroxyzine Last refill 07/24/22 #40 Last office visit 07/10/22

## 2022-10-16 ENCOUNTER — Other Ambulatory Visit: Payer: Self-pay | Admitting: Family Medicine

## 2022-10-16 DIAGNOSIS — F331 Major depressive disorder, recurrent, moderate: Secondary | ICD-10-CM

## 2022-10-22 ENCOUNTER — Other Ambulatory Visit: Payer: Self-pay | Admitting: Family Medicine

## 2022-10-22 DIAGNOSIS — I10 Essential (primary) hypertension: Secondary | ICD-10-CM

## 2022-11-10 ENCOUNTER — Other Ambulatory Visit: Payer: Self-pay | Admitting: Family Medicine

## 2022-11-10 DIAGNOSIS — E118 Type 2 diabetes mellitus with unspecified complications: Secondary | ICD-10-CM

## 2022-11-10 DIAGNOSIS — E559 Vitamin D deficiency, unspecified: Secondary | ICD-10-CM

## 2022-11-10 DIAGNOSIS — E538 Deficiency of other specified B group vitamins: Secondary | ICD-10-CM

## 2022-11-10 DIAGNOSIS — D649 Anemia, unspecified: Secondary | ICD-10-CM

## 2022-11-10 DIAGNOSIS — E1169 Type 2 diabetes mellitus with other specified complication: Secondary | ICD-10-CM

## 2022-11-14 ENCOUNTER — Other Ambulatory Visit (INDEPENDENT_AMBULATORY_CARE_PROVIDER_SITE_OTHER): Payer: 59

## 2022-11-14 DIAGNOSIS — E118 Type 2 diabetes mellitus with unspecified complications: Secondary | ICD-10-CM | POA: Diagnosis not present

## 2022-11-14 DIAGNOSIS — E559 Vitamin D deficiency, unspecified: Secondary | ICD-10-CM | POA: Diagnosis not present

## 2022-11-14 DIAGNOSIS — Z7985 Long-term (current) use of injectable non-insulin antidiabetic drugs: Secondary | ICD-10-CM | POA: Diagnosis not present

## 2022-11-14 DIAGNOSIS — E1169 Type 2 diabetes mellitus with other specified complication: Secondary | ICD-10-CM

## 2022-11-14 DIAGNOSIS — D649 Anemia, unspecified: Secondary | ICD-10-CM | POA: Diagnosis not present

## 2022-11-14 DIAGNOSIS — E538 Deficiency of other specified B group vitamins: Secondary | ICD-10-CM | POA: Diagnosis not present

## 2022-11-14 DIAGNOSIS — E785 Hyperlipidemia, unspecified: Secondary | ICD-10-CM | POA: Diagnosis not present

## 2022-11-14 LAB — COMPREHENSIVE METABOLIC PANEL
ALT: 23 U/L (ref 0–35)
AST: 23 U/L (ref 0–37)
Albumin: 4 g/dL (ref 3.5–5.2)
Alkaline Phosphatase: 72 U/L (ref 39–117)
BUN: 10 mg/dL (ref 6–23)
CO2: 25 mEq/L (ref 19–32)
Calcium: 8.7 mg/dL (ref 8.4–10.5)
Chloride: 103 mEq/L (ref 96–112)
Creatinine, Ser: 0.93 mg/dL (ref 0.40–1.20)
GFR: 67.11 mL/min (ref >60.00)
Glucose, Bld: 149 mg/dL — ABNORMAL HIGH (ref 70–99)
Potassium: 4 mEq/L (ref 3.5–5.1)
Sodium: 140 mEq/L (ref 135–145)
Total Bilirubin: 0.4 mg/dL (ref 0.2–1.2)
Total Protein: 7 g/dL (ref 6.0–8.3)

## 2022-11-14 LAB — VITAMIN B12: Vitamin B-12: 483 pg/mL (ref 211–911)

## 2022-11-14 LAB — CBC WITH DIFFERENTIAL/PLATELET
Basophils Absolute: 0.1 10*3/uL (ref 0.0–0.1)
Basophils Relative: 1.1 % (ref 0.0–3.0)
Eosinophils Absolute: 0.3 10*3/uL (ref 0.0–0.7)
Eosinophils Relative: 3 % (ref 0.0–5.0)
HCT: 44.4 % (ref 36.0–46.0)
Hemoglobin: 14.4 g/dL (ref 12.0–15.0)
Lymphocytes Relative: 35.7 % (ref 12.0–46.0)
Lymphs Abs: 3 10*3/uL (ref 0.7–4.0)
MCHC: 32.5 g/dL (ref 30.0–36.0)
MCV: 91.1 fl (ref 78.0–100.0)
Monocytes Absolute: 0.8 10*3/uL (ref 0.1–1.0)
Monocytes Relative: 9.9 % (ref 3.0–12.0)
Neutro Abs: 4.2 10*3/uL (ref 1.4–7.7)
Neutrophils Relative %: 50.3 % (ref 43.0–77.0)
Platelets: 272 10*3/uL (ref 150.0–400.0)
RBC: 4.88 Mil/uL (ref 3.87–5.11)
RDW: 14.2 % (ref 11.5–15.5)
WBC: 8.4 10*3/uL (ref 4.0–10.5)

## 2022-11-14 LAB — LIPID PANEL
Cholesterol: 123 mg/dL (ref 0–200)
HDL: 39.4 mg/dL (ref >39.00)
LDL Cholesterol: 49 mg/dL (ref 0–99)
NonHDL: 83.95
Total CHOL/HDL Ratio: 3
Triglycerides: 175 mg/dL — ABNORMAL HIGH (ref 0.0–149.0)
VLDL: 35 mg/dL (ref 0.0–40.0)

## 2022-11-14 LAB — VITAMIN D 25 HYDROXY (VIT D DEFICIENCY, FRACTURES): VITD: 43.58 ng/mL (ref 30.00–100.00)

## 2022-11-14 LAB — MICROALBUMIN / CREATININE URINE RATIO
Creatinine,U: 251.4 mg/dL
Microalb Creat Ratio: 2.7 mg/g (ref 0.0–30.0)
Microalb, Ur: 6.7 mg/dL — ABNORMAL HIGH (ref 0.0–1.9)

## 2022-11-14 LAB — HEMOGLOBIN A1C: Hgb A1c MFr Bld: 7.3 % — ABNORMAL HIGH (ref 4.6–6.5)

## 2022-11-20 ENCOUNTER — Encounter: Payer: Self-pay | Admitting: Family Medicine

## 2022-11-20 ENCOUNTER — Ambulatory Visit (INDEPENDENT_AMBULATORY_CARE_PROVIDER_SITE_OTHER): Payer: 59 | Admitting: Family Medicine

## 2022-11-20 VITALS — BP 118/84 | HR 91 | Temp 97.4°F | Ht 64.25 in | Wt 296.1 lb

## 2022-11-20 DIAGNOSIS — Z87891 Personal history of nicotine dependence: Secondary | ICD-10-CM

## 2022-11-20 DIAGNOSIS — Z0001 Encounter for general adult medical examination with abnormal findings: Secondary | ICD-10-CM | POA: Diagnosis not present

## 2022-11-20 DIAGNOSIS — I10 Essential (primary) hypertension: Secondary | ICD-10-CM | POA: Diagnosis not present

## 2022-11-20 DIAGNOSIS — G4733 Obstructive sleep apnea (adult) (pediatric): Secondary | ICD-10-CM

## 2022-11-20 DIAGNOSIS — Z6841 Body Mass Index (BMI) 40.0 and over, adult: Secondary | ICD-10-CM

## 2022-11-20 DIAGNOSIS — E118 Type 2 diabetes mellitus with unspecified complications: Secondary | ICD-10-CM

## 2022-11-20 DIAGNOSIS — E559 Vitamin D deficiency, unspecified: Secondary | ICD-10-CM

## 2022-11-20 DIAGNOSIS — Z7189 Other specified counseling: Secondary | ICD-10-CM

## 2022-11-20 DIAGNOSIS — E785 Hyperlipidemia, unspecified: Secondary | ICD-10-CM

## 2022-11-20 DIAGNOSIS — M545 Other chronic pain: Secondary | ICD-10-CM

## 2022-11-20 DIAGNOSIS — R87610 Atypical squamous cells of undetermined significance on cytologic smear of cervix (ASC-US): Secondary | ICD-10-CM

## 2022-11-20 DIAGNOSIS — E1169 Type 2 diabetes mellitus with other specified complication: Secondary | ICD-10-CM

## 2022-11-20 DIAGNOSIS — M797 Fibromyalgia: Secondary | ICD-10-CM

## 2022-11-20 DIAGNOSIS — G8929 Other chronic pain: Secondary | ICD-10-CM

## 2022-11-20 DIAGNOSIS — E538 Deficiency of other specified B group vitamins: Secondary | ICD-10-CM

## 2022-11-20 DIAGNOSIS — K760 Fatty (change of) liver, not elsewhere classified: Secondary | ICD-10-CM

## 2022-11-20 DIAGNOSIS — K219 Gastro-esophageal reflux disease without esophagitis: Secondary | ICD-10-CM | POA: Diagnosis not present

## 2022-11-20 DIAGNOSIS — F331 Major depressive disorder, recurrent, moderate: Secondary | ICD-10-CM | POA: Diagnosis not present

## 2022-11-20 DIAGNOSIS — L03221 Cellulitis of neck: Secondary | ICD-10-CM

## 2022-11-20 DIAGNOSIS — Z7985 Long-term (current) use of injectable non-insulin antidiabetic drugs: Secondary | ICD-10-CM

## 2022-11-20 DIAGNOSIS — Z1211 Encounter for screening for malignant neoplasm of colon: Secondary | ICD-10-CM

## 2022-11-20 MED ORDER — SEMAGLUTIDE (1 MG/DOSE) 4 MG/3ML ~~LOC~~ SOPN: 1.0000 mg | PEN_INJECTOR | SUBCUTANEOUS | 4 refills | Status: DC

## 2022-11-20 MED ORDER — DOXYCYCLINE HYCLATE 100 MG PO TABS: 100.0000 mg | ORAL_TABLET | Freq: Two times a day (BID) | ORAL | 0 refills | Status: DC

## 2022-11-20 NOTE — Progress Notes (Unsigned)
Ph: [phone] Fax: [phone]   Patient ID: Kathleen Good, female    DOB: Dec 03, 1960, 62 y.o.   MRN: [id number]  This visit was conducted in person.  BP 118/84   Pulse 91   Temp (!) 97.4 F (36.3 C) (Temporal)   Ht 5' 4.25" (1.632 m)   Wt 296 lb 2 oz (134.3 kg)   LMP 04/25/2014 (Approximate)   SpO2 93%   BMI 50.43 kg/m    CC: CPE Subjective:   HPI: Kathleen Good is a 62 y.o. female presenting on 11/20/2022 for Annual Exam (C/o pimple on R side of neck. Noticed 11/16/22. Area has increased in size and has some redness. Pt concerned due to h/o MRSA. )   Husband with early onset FTD. She is plugged into FB support groups but still having difficulty.   Known diabetic on glimepiride 4mg  daily, actos 15mg  daily. She continues ozempic 1mg  weekly. Metformin intolerance (GI upset). Notes constipation. No diarrhea, epigastric pain, nausea. No fmhx MTC or MEN2.   Friday developed pustule to right neck area, progressively enlarging each day. No drainage. No fevers/chills.   Preventative: COLONOSCOPY Date: 12/11/2011 hyperplastic polyp, rpt 5 yrs given fmhx - father with colon cancer ager 54yo Iva Boop). Desires to postpone colonoscopy due to family situation. iFOB negative 11/2021.  Well woman exam with PCP - pap ASCUS 09/2020, neg HPV - rpt 3 yrs LMP 04/2014  Latest mammo at [facility] in [place] 10/2021 - due for rpt she will check with insurance Lung cancer screening - not eligible.  Flu - yearly COVID vaccine Pfizer 09/2019 x2, booster 03/2021 Pneumovax 2015  Tdap - 11/2010, Td 11/2019 Shingrix - 03/2019, 06/2019 Advanced directive - does not have this. Would want son Kathleen Good to be HCPOA. Packet previously provided.  Seat belt use discussed Sunscreen use discussed. No suspicious moles. Ex smoker - quit 2014, 15 PY history. Restarted 2022 <1ppd, quit 02/2021.  Alcohol - rare  Dentist - last seen 2024 Eye exam - 2021 [facility])   Lives with  husband and pets Occupation: housewife Activity: walk dogs but limited by back pain Diet: good water, some fruits/vegetables     Relevant past medical, surgical, family and social history reviewed and updated as indicated. Interim medical history since our last visit reviewed. Allergies and medications reviewed and updated. Outpatient Medications Prior to Visit  Medication Sig Dispense Refill   albuterol (PROVENTIL) (2.5 MG/3ML) 0.083% nebulizer solution Take 3 mLs (2.5 mg total) by nebulization every 6 (six) hours as needed for wheezing or shortness of breath. 150 mL 1   aspirin EC 81 MG tablet Take 81 mg by mouth daily with supper.     baclofen (LIORESAL) 10 MG tablet TAKE ONE TABLET BY MOUTH THREE TIMES A DAY 30 tablet 01   Blood Glucose Monitoring Suppl KIT Use to check sugar once daily 2 hours after a meal. Dx: E11.65 **Dispense per insurance guidelines** 1 each 0   Cholecalciferol (VITAMIN D) 50 MCG (2000 UT) CAPS Take 1 capsule (2,000 Units total) by mouth daily. 30 capsule    clobetasol ointment (TEMOVATE) 0.05 % Apply 1 application topically 2 (two) times daily. As needed 60 g 1   Coenzyme Q10 ([employer] COQ-10) 200 MG capsule Take 1 capsule (200 mg total) by mouth daily.     diphenhydramine-acetaminophen (TYLENOL PM) 25-500 MG TABS tablet Take 2 tablets by mouth at bedtime as needed (pain).     Magnesium 200 MG TABS  Take 1 tablet (200 mg total) by mouth daily. 30 tablet    Melatonin 5 MG TABS Take 1 tablet by mouth at bedtime as needed.     meloxicam (MOBIC) 15 MG tablet TAKE 1 TABLET BY MOUTH DAILY 30 tablet 0   mupirocin ointment (BACTROBAN) 2 % Apply 1 Application topically 2 (two) times daily. To affected are R external ear 22 g 0   naproxen sodium (ALEVE) 220 MG tablet Take 220 mg by mouth daily as needed. Takes 2 tablets     Needle, Disp, 18G X 1.25" MISC 1 Units by Does not apply route every 30 (thirty) days. For b12 injection 10 each 1   NEEDLE, DISP, 25 G 25G X 1" MISC Use as  directed - 1 needle monthly for b12 shot administration 10 each 1   OneTouch Delica Lancets 33G MISC Use to check sugar twice daily as needed. Dispense based on insurance preference. Dx: E11.65 180 each 3   ONETOUCH ULTRA test strip USE ONE STRIP TO TEST TWICE A DAY 200 strip 3   rosuvastatin (CRESTOR) 40 MG tablet Take 1 tablet (40 mg total) by mouth daily. 90 tablet 3   doxycycline (VIBRA-TABS) 100 MG tablet Take 1 tablet (100 mg total) by mouth 2 (two) times daily. 14 tablet 0   traZODone (DESYREL) 50 MG tablet TAKE ONE TABLET BY MOUTH EVERY NIGHT AT BEDTIME AS NEEDED FOR SLEEP 30 tablet 5   buPROPion (WELLBUTRIN SR) 150 MG 12 hr tablet Take 1 tablet (150 mg total) by mouth 2 (two) times daily. 180 tablet 3   busPIRone (BUSPAR) 7.5 MG tablet Take 1 tablet (7.5 mg total) by mouth at bedtime. 90 tablet 3   citalopram (CELEXA) 10 MG tablet Take 1 tablet (10 mg total) by mouth daily. 90 tablet 3   cyanocobalamin (,VITAMIN B-12,) 1000 MCG/ML injection INJECT 1 ML INTO MUSCLE ONCE MONTHLY 1 mL 11   diltiazem (TIAZAC) 180 MG 24 hr capsule Take 1 capsule (180 mg total) by mouth daily. 90 capsule 3   glimepiride (AMARYL) 4 MG tablet Take 1 tablet (4 mg total) by mouth daily with breakfast. 90 tablet 3   hydrochlorothiazide (HYDRODIURIL) 25 MG tablet TAKE 1 TABLET BY MOUTH DAILY FOR LEG SWELLING 90 tablet 0   hydrOXYzine (ATARAX) 25 MG tablet TAKE ONE TABLET BY MOUTH THREE TIMES A DAY AS NEEDED FOR ANXIETY 40 tablet 0   metoprolol succinate (TOPROL-XL) 100 MG 24 hr tablet TAKE ONE TABLET BY MOUTH EVERY MORNING AND  EVERY NIGHT AT BEDTIME. TAKE WITH OR IMMEDIATELY FOLLOWING A MEAL 180 tablet 0   omeprazole (PRILOSEC) 40 MG capsule Take 1 capsule (40 mg total) by mouth 2 (two) times daily. 180 capsule 3   pioglitazone (ACTOS) 15 MG tablet Take 1 tablet (15 mg total) by mouth daily. 30 tablet 11   No facility-administered medications prior to visit.     Per HPI unless specifically indicated in ROS section  below Review of Systems  Constitutional:  Negative for activity change, appetite change, chills, fatigue, fever and unexpected weight change.  HENT:  Negative for hearing loss.   Eyes:  Negative for visual disturbance.  Respiratory:  Positive for cough (occ). Negative for chest tightness, shortness of breath and wheezing.   Cardiovascular:  Positive for palpitations and leg swelling. Negative for chest pain.  Gastrointestinal:  Positive for constipation. Negative for abdominal distention, abdominal pain, blood in stool, diarrhea, nausea and vomiting.  Genitourinary:  Negative for difficulty urinating and hematuria.  Musculoskeletal:  Negative for arthralgias, myalgias and neck pain.  Skin:  Negative for rash.  Neurological:  Negative for dizziness, seizures, syncope and headaches.  Hematological:  Negative for adenopathy. Does not bruise/bleed easily.  Psychiatric/Behavioral:  Negative for dysphoric mood. The patient is not nervous/anxious.     Objective:  BP 118/84   Pulse 91   Temp (!) 97.4 F (36.3 C) (Temporal)   Ht 5' 4.25" (1.632 m)   Wt 296 lb 2 oz (134.3 kg)   LMP 04/25/2014 (Approximate)   SpO2 93%   BMI 50.43 kg/m   Wt Readings from Last 3 Encounters:  11/20/22 296 lb 2 oz (134.3 kg)  07/10/22 296 lb 4 oz (134.4 kg)  02/19/22 282 lb (127.9 kg)      Physical Exam Vitals and nursing note reviewed.  Constitutional:      Appearance: Normal appearance. She is not ill-appearing.  HENT:     Head: Normocephalic and atraumatic.      Comments: Scabbed pustule to right upper neck with surrounding induration and erythema without fluctuance    Right Ear: Tympanic membrane, ear canal and external ear normal. There is no impacted cerumen.     Left Ear: Tympanic membrane, ear canal and external ear normal. There is no impacted cerumen.     Nose: Nose normal.     Mouth/Throat:     Mouth: Mucous membranes are moist.     Pharynx: Oropharynx is clear. No oropharyngeal exudate or  posterior oropharyngeal erythema.  Eyes:     General:        Right eye: No discharge.        Left eye: No discharge.     Extraocular Movements: Extraocular movements intact.     Conjunctiva/sclera: Conjunctivae normal.     Pupils: Pupils are equal, round, and reactive to light.  Neck:     Thyroid: No thyroid mass or thyromegaly.  Cardiovascular:     Rate and Rhythm: Normal rate and regular rhythm.     Pulses: Normal pulses.     Heart sounds: Normal heart sounds. No murmur heard. Pulmonary:     Effort: Pulmonary effort is normal. No respiratory distress.     Breath sounds: Normal breath sounds. No wheezing, rhonchi or rales.  Abdominal:     General: Bowel sounds are normal. There is no distension.     Palpations: Abdomen is soft. There is no mass.     Tenderness: There is no abdominal tenderness. There is no guarding or rebound.     Hernia: No hernia is present.  Musculoskeletal:     Cervical back: Normal range of motion and neck supple. No rigidity.     Right lower leg: No edema.     Left lower leg: No edema.  Lymphadenopathy:     Cervical: No cervical adenopathy.  Skin:    General: Skin is warm and dry.     Findings: Lesion present. No rash.  Neurological:     General: No focal deficit present.     Mental Status: She is alert. Mental status is at baseline.  Psychiatric:        Mood and Affect: Mood normal.        Behavior: Behavior normal.       Results for orders placed or performed in visit on 11/14/22  VITAMIN D 25 Hydroxy (Vit-D Deficiency, Fractures)  Result Value Ref Range   VITD 43.58 30.00 - 100.00 ng/mL  Vitamin B12  Result Value Ref Range  Vitamin B-12 483 211 - 911 pg/mL  CBC with Differential/Platelet  Result Value Ref Range   WBC 8.4 4.0 - 10.5 K/uL   RBC 4.88 3.87 - 5.11 Mil/uL   Hemoglobin 14.4 12.0 - 15.0 g/dL   HCT 65.7 84.6 - 96.2 %   MCV 91.1 78.0 - 100.0 fl   MCHC 32.5 30.0 - 36.0 g/dL   RDW 95.2 84.1 - 32.4 %   Platelets 272.0 150.0 -  400.0 K/uL   Neutrophils Relative % 50.3 43.0 - 77.0 %   Lymphocytes Relative 35.7 12.0 - 46.0 %   Monocytes Relative 9.9 3.0 - 12.0 %   Eosinophils Relative 3.0 0.0 - 5.0 %   Basophils Relative 1.1 0.0 - 3.0 %   Neutro Abs 4.2 1.4 - 7.7 K/uL   Lymphs Abs 3.0 0.7 - 4.0 K/uL   Monocytes Absolute 0.8 0.1 - 1.0 K/uL   Eosinophils Absolute 0.3 0.0 - 0.7 K/uL   Basophils Absolute 0.1 0.0 - 0.1 K/uL  Comprehensive metabolic panel  Result Value Ref Range   Sodium 140 135 - 145 mEq/L   Potassium 4.0 3.5 - 5.1 mEq/L   Chloride 103 96 - 112 mEq/L   CO2 25 19 - 32 mEq/L   Glucose, Bld 149 (H) 70 - 99 mg/dL   BUN 10 6 - 23 mg/dL   Creatinine, Ser 4.01 0.40 - 1.20 mg/dL   Total Bilirubin 0.4 0.2 - 1.2 mg/dL   Alkaline Phosphatase 72 39 - 117 U/L   AST 23 0 - 37 U/L   ALT 23 0 - 35 U/L   Total Protein 7.0 6.0 - 8.3 g/dL   Albumin 4.0 3.5 - 5.2 g/dL   GFR 02.72 >53.66 mL/min   Calcium 8.7 8.4 - 10.5 mg/dL  Lipid panel  Result Value Ref Range   Cholesterol 123 0 - 200 mg/dL   Triglycerides 440.3 (H) 0.0 - 149.0 mg/dL   HDL 47.42 >59.56 mg/dL   VLDL 38.7 0.0 - 56.4 mg/dL   LDL Cholesterol 49 0 - 99 mg/dL   Total CHOL/HDL Ratio 3    NonHDL 83.95   Microalbumin / creatinine urine ratio  Result Value Ref Range   Microalb, Ur 6.7 (H) 0.0 - 1.9 mg/dL   Creatinine,U 332.9 mg/dL   Microalb Creat Ratio 2.7 0.0 - 30.0 mg/g  Hemoglobin A1c  Result Value Ref Range   Hgb A1c MFr Bld 7.3 (H) 4.6 - 6.5 %    Assessment & Plan:   Problem List Items Addressed This Visit     Encounter for general adult medical examination with abnormal findings - Primary (Chronic)    Preventative protocols reviewed and updated unless pt declined. Discussed healthy diet and lifestyle.       Fibromyalgia (Chronic)   Relevant Medications   buPROPion (WELLBUTRIN SR) 150 MG 12 hr tablet   citalopram (CELEXA) 10 MG tablet   traZODone (DESYREL) 50 MG tablet   Advanced directives, counseling/discussion (Chronic)     Advanced directive - does not have this. Would want son Kathleen Good to be HCPOA. Packet previously provided.       Ex-smoker    Remains off cigarettes.      MDD (major depressive disorder), recurrent episode, moderate (HCC)    Chronic, stable period. Caregiver stress. Provided contact info for local [facility] [place].       Relevant Medications   buPROPion (WELLBUTRIN SR) 150 MG 12 hr tablet   busPIRone (BUSPAR) 7.5 MG tablet  citalopram (CELEXA) 10 MG tablet   hydrOXYzine (ATARAX) 25 MG tablet   traZODone (DESYREL) 50 MG tablet   GERD    Chronic, stable period on omeprazole 40mg  bid      Relevant Medications   omeprazole (PRILOSEC) 40 MG capsule   Type 2 diabetes mellitus with unspecified complications (HCC)    Chronic, A1c remains above ideal goal. Continue current regimen. RTC 6 mo DM f/u visit.       Relevant Medications   Semaglutide, 1 MG/DOSE, 4 MG/3ML SOPN   glimepiride (AMARYL) 4 MG tablet   pioglitazone (ACTOS) 15 MG tablet   Dyslipidemia associated with type 2 diabetes mellitus (HCC)    Chronic, stable on rosuvastatin 40mg  daily - continue.  The ASCVD Risk score (Arnett DK, et al., 2019) failed to calculate for the following reasons:   The valid total cholesterol range is 130 to 320 mg/dL       Relevant Medications   Semaglutide, 1 MG/DOSE, 4 MG/3ML SOPN   glimepiride (AMARYL) 4 MG tablet   pioglitazone (ACTOS) 15 MG tablet   HYPERTENSION, BENIGN    Chronic, stable on current regimen - continue. Consider adding ACEI/ARB in diabetic for kidney protection      Relevant Medications   diltiazem (TIAZAC) 180 MG 24 hr capsule   hydrochlorothiazide (HYDRODIURIL) 25 MG tablet   metoprolol succinate (TOPROL-XL) 100 MG 24 hr tablet   Morbid obesity with BMI of 50.0-59.9, adult (HCC)    Encourage healthy diet and lifestyle choices to affect sustainable weight loss.       Relevant Medications   Semaglutide, 1 MG/DOSE, 4 MG/3ML SOPN    glimepiride (AMARYL) 4 MG tablet   pioglitazone (ACTOS) 15 MG tablet   OSA (obstructive sleep apnea)    Not on CPAP due to cost      Vitamin B12 deficiency    Chronic, stable. Continue monthly b12 shots at home.       Relevant Medications   cyanocobalamin (VITAMIN B12) 1000 MCG/ML injection   Vitamin D deficiency    Continue vit D 2000 IU daily.       Chronic back pain   Relevant Medications   buPROPion (WELLBUTRIN SR) 150 MG 12 hr tablet   citalopram (CELEXA) 10 MG tablet   traZODone (DESYREL) 50 MG tablet   ASCUS of cervix with negative high risk HPV    Discussed next pap due 09/2023.       NAFLD (nonalcoholic fatty liver disease)   Cellulitis, neck    Cellulitis surrounding pustule. Nothing to drain today. Rec warm compresses, Rx doxycycline course. Update if not healing as expected.       Other Visit Diagnoses     Special screening for malignant neoplasms, colon       Relevant Orders   Fecal occult blood, imunochemical        Meds ordered this encounter  Medications   Semaglutide, 1 MG/DOSE, 4 MG/3ML SOPN    Sig: Inject 1 mg as directed once a week.    Dispense:  9 mL    Refill:  4   doxycycline (VIBRA-TABS) 100 MG tablet    Sig: Take 1 tablet (100 mg total) by mouth 2 (two) times daily.    Dispense:  20 tablet    Refill:  0   buPROPion (WELLBUTRIN SR) 150 MG 12 hr tablet    Sig: Take 1 tablet (150 mg total) by mouth 2 (two) times daily.    Dispense:  180 tablet  Refill:  4   busPIRone (BUSPAR) 7.5 MG tablet    Sig: Take 1 tablet (7.5 mg total) by mouth at bedtime.    Dispense:  90 tablet    Refill:  4   citalopram (CELEXA) 10 MG tablet    Sig: Take 1 tablet (10 mg total) by mouth daily.    Dispense:  90 tablet    Refill:  4   cyanocobalamin (VITAMIN B12) 1000 MCG/ML injection    Sig: INJECT 1 ML INTO MUSCLE ONCE MONTHLY    Dispense:  1 mL    Refill:  12   diltiazem (TIAZAC) 180 MG 24 hr capsule    Sig: Take 1 capsule (180 mg total) by mouth  daily.    Dispense:  90 capsule    Refill:  4   glimepiride (AMARYL) 4 MG tablet    Sig: Take 1 tablet (4 mg total) by mouth daily with breakfast.    Dispense:  90 tablet    Refill:  4   hydrochlorothiazide (HYDRODIURIL) 25 MG tablet    Sig: TAKE 1 TABLET BY MOUTH DAILY FOR LEG SWELLING    Dispense:  90 tablet    Refill:  4   hydrOXYzine (ATARAX) 25 MG tablet    Sig: Take 1 tablet (25 mg total) by mouth 3 (three) times daily as needed. for anxiety    Dispense:  40 tablet    Refill:  3   metoprolol succinate (TOPROL-XL) 100 MG 24 hr tablet    Sig: TAKE ONE TABLET BY MOUTH EVERY MORNING AND  EVERY NIGHT AT BEDTIME. TAKE WITH OR IMMEDIATELY FOLLOWING A MEAL    Dispense:  180 tablet    Refill:  4   omeprazole (PRILOSEC) 40 MG capsule    Sig: Take 1 capsule (40 mg total) by mouth 2 (two) times daily.    Dispense:  180 capsule    Refill:  4   pioglitazone (ACTOS) 15 MG tablet    Sig: Take 1 tablet (15 mg total) by mouth daily.    Dispense:  90 tablet    Refill:  4   traZODone (DESYREL) 50 MG tablet    Sig: TAKE ONE TABLET BY MOUTH EVERY NIGHT AT BEDTIME AS NEEDED FOR SLEEP    Dispense:  30 tablet    Refill:  11    Orders Placed This Encounter  Procedures   Fecal occult blood, imunochemical    Standing Status:   Future    Standing Expiration Date:   11/20/2023    Patient Instructions  Contact [facility] [place] to see if any further available resources [phone].  Pass by lab to pick up stool kit.  Call insurance to find where you can get mammogram this year and let us know if you need referral. Ask about diabetic eye exam as well.  For infected area to right neck - take doxycycline 10 day course and regular use of warm compresses through out the day.  Good to see you today Return as needed or in 6 months for diabetes follow up visit.   Follow up plan: Return in about 6 months (around 05/23/2023), or if symptoms worsen or fail to improve, for follow up  visit.  Eustaquio Boyden, MD

## 2022-11-20 NOTE — Patient Instructions (Addendum)
[facility] [place] to see if any further available resources [phone].  Pass by lab to pick up stool kit.  Call insurance to find where you can get mammogram this year and let us know if you need referral. Ask about diabetic eye exam as well.  For infected area to right neck - take doxycycline 10 day course and regular use of warm compresses through out the day.  Good to see you today Return as needed or in 6 months for diabetes follow up visit.

## 2022-11-21 DIAGNOSIS — L03221 Cellulitis of neck: Secondary | ICD-10-CM | POA: Insufficient documentation

## 2022-11-21 MED ORDER — METOPROLOL SUCCINATE ER 100 MG PO TB24: ORAL_TABLET | ORAL | 4 refills | Status: DC

## 2022-11-21 MED ORDER — PIOGLITAZONE HCL 15 MG PO TABS: 15.0000 mg | ORAL_TABLET | Freq: Every day | ORAL | 4 refills | Status: DC

## 2022-11-21 MED ORDER — CYANOCOBALAMIN 1000 MCG/ML IJ SOLN
INTRAMUSCULAR | 12 refills | Status: DC
Start: 2022-11-21 — End: 2023-11-25

## 2022-11-21 MED ORDER — HYDROCHLOROTHIAZIDE 25 MG PO TABS: ORAL_TABLET | ORAL | 4 refills | Status: DC

## 2022-11-21 MED ORDER — BUPROPION HCL ER (SR) 150 MG PO TB12: 150.0000 mg | ORAL_TABLET | Freq: Two times a day (BID) | ORAL | 4 refills | Status: DC

## 2022-11-21 MED ORDER — OMEPRAZOLE 40 MG PO CPDR: 40.0000 mg | DELAYED_RELEASE_CAPSULE | Freq: Two times a day (BID) | ORAL | 4 refills | Status: DC

## 2022-11-21 MED ORDER — BUSPIRONE HCL 7.5 MG PO TABS: 7.5000 mg | ORAL_TABLET | Freq: Every day | ORAL | 4 refills | Status: DC

## 2022-11-21 MED ORDER — HYDROXYZINE HCL 25 MG PO TABS: 25.0000 mg | ORAL_TABLET | Freq: Three times a day (TID) | ORAL | 3 refills | Status: DC | PRN

## 2022-11-21 MED ORDER — TRAZODONE HCL 50 MG PO TABS
ORAL_TABLET | ORAL | 11 refills | Status: DC
Start: 2022-11-21 — End: 2023-06-24

## 2022-11-21 MED ORDER — CITALOPRAM HYDROBROMIDE 10 MG PO TABS: 10.0000 mg | ORAL_TABLET | Freq: Every day | ORAL | 4 refills | Status: DC

## 2022-11-21 MED ORDER — DILTIAZEM HCL ER BEADS 180 MG PO CP24: 180.0000 mg | ORAL_CAPSULE | Freq: Every day | ORAL | 4 refills | Status: DC

## 2022-11-21 MED ORDER — GLIMEPIRIDE 4 MG PO TABS: 4.0000 mg | ORAL_TABLET | Freq: Every day | ORAL | 4 refills | Status: DC

## 2022-11-21 NOTE — Assessment & Plan Note (Addendum)
Chronic, stable on rosuvastatin 40mg  daily - continue.  The ASCVD Risk score (Arnett DK, et al., 2019) failed to calculate for the following reasons:   The valid total cholesterol range is 130 to 320 mg/dL

## 2022-11-21 NOTE — Assessment & Plan Note (Signed)
Remains off cigarettes 

## 2022-11-21 NOTE — Assessment & Plan Note (Signed)
Chronic, stable period. Caregiver stress. Provided contact info for local [facility] [place].

## 2022-11-21 NOTE — Assessment & Plan Note (Addendum)
Cellulitis surrounding pustule. Nothing to drain today. Rec warm compresses, Rx doxycycline course. Update if not healing as expected.

## 2022-11-21 NOTE — Assessment & Plan Note (Signed)
Not on CPAP due to cost 

## 2022-11-21 NOTE — Assessment & Plan Note (Signed)
Preventative protocols reviewed and updated unless pt declined. Discussed healthy diet and lifestyle.  

## 2022-11-21 NOTE — Assessment & Plan Note (Signed)
Discussed next pap due 09/2023.

## 2022-11-21 NOTE — Assessment & Plan Note (Signed)
Chronic, stable. Continue monthly b12 shots at home.

## 2022-11-21 NOTE — Assessment & Plan Note (Signed)
Encourage healthy diet and lifestyle choices to affect sustainable weight loss.  

## 2022-11-21 NOTE — Assessment & Plan Note (Signed)
Chronic, stable period on omeprazole 40mg  bid

## 2022-11-21 NOTE — Assessment & Plan Note (Signed)
Continue vit D 2000 IU daily.  

## 2022-11-21 NOTE — Assessment & Plan Note (Addendum)
Chronic, A1c remains above ideal goal. Continue current regimen. RTC 6 mo DM f/u visit.

## 2022-11-21 NOTE — Assessment & Plan Note (Signed)
Advanced directive - does not have this. Would want son Kathleen Good to be HCPOA. Packet previously provided.

## 2022-11-21 NOTE — Assessment & Plan Note (Addendum)
Chronic, stable on current regimen - continue. Consider adding ACEI/ARB in diabetic for kidney protection

## 2022-12-16 ENCOUNTER — Other Ambulatory Visit: Payer: Self-pay | Admitting: Family Medicine

## 2022-12-18 ENCOUNTER — Other Ambulatory Visit: Payer: Self-pay

## 2022-12-18 NOTE — Telephone Encounter (Signed)
error 

## 2022-12-19 ENCOUNTER — Ambulatory Visit: Payer: 59 | Admitting: Family Medicine

## 2022-12-19 ENCOUNTER — Encounter: Payer: Self-pay | Admitting: Family Medicine

## 2022-12-19 ENCOUNTER — Ambulatory Visit (INDEPENDENT_AMBULATORY_CARE_PROVIDER_SITE_OTHER)
Admission: RE | Admit: 2022-12-19 | Discharge: 2022-12-19 | Disposition: A | Discharge status: Home / Self Care | Payer: 59 | Source: Ambulatory Visit | Attending: Family Medicine | Admitting: Family Medicine

## 2022-12-19 ENCOUNTER — Telehealth: Payer: Self-pay

## 2022-12-19 VITALS — BP 122/86 | HR 81 | Temp 97.7°F | Ht 64.25 in | Wt 290.5 lb

## 2022-12-19 DIAGNOSIS — R0609 Other forms of dyspnea: Secondary | ICD-10-CM | POA: Diagnosis not present

## 2022-12-19 DIAGNOSIS — R7989 Other specified abnormal findings of blood chemistry: Secondary | ICD-10-CM | POA: Diagnosis not present

## 2022-12-19 DIAGNOSIS — R079 Chest pain, unspecified: Secondary | ICD-10-CM | POA: Diagnosis not present

## 2022-12-19 LAB — D-DIMER, QUANTITATIVE: D-Dimer, Quant: 1.99 mcg/mL FEU — ABNORMAL HIGH (ref <0.50)

## 2022-12-19 NOTE — Addendum Note (Signed)
Addended by: Eustaquio Boyden on: 12/19/2022 04:59 PM   Modules accepted: Orders

## 2022-12-19 NOTE — Assessment & Plan Note (Signed)
O2 sat 93% RA.  No h/o asthma, non smoker. Reassuring lung exam.  Check D dimer and CXR today.

## 2022-12-19 NOTE — Telephone Encounter (Signed)
Scott with [facility] lab called critical lab report on D Dimer 1.99 (is already in Epic). sending note to Dr Reece Agar and G pool and will let Misty Stanley CMA know also.result in lab notebook.

## 2022-12-19 NOTE — Telephone Encounter (Signed)
This was addressed. STAT CTA chest ordered. Pt notified

## 2022-12-19 NOTE — Progress Notes (Addendum)
Ph: [phone] Fax: [phone]   Patient ID: Kathleen Good, female    DOB: [birth year], 62 y.o.   MRN: [id number]  This visit was conducted in person.  BP 122/86   Pulse 81   Temp 97.7 F (36.5 C) (Temporal)   Ht 5' 4.25" (1.632 m)   Wt 290 lb 8 oz (131.8 kg)   LMP 04/25/2014 (Approximate)   SpO2 93%   BMI 49.48 kg/m   Orthostatic Vitals for the past 48 hrs (Last 6 readings):  Patient Position Orthostatic BP BP Pulse  12/19/22 1056 -- -- 122/86 81  12/19/22 1113 Supine 122/84 -- --  12/19/22 1116 Standing 118/82 -- --   CC: R sided chest pain/tightness, weakness Subjective:   HPI: Kathleen Good is a 62 y.o. female presenting on 12/19/2022 for Chest Pain (C/o R side chest pain/tightness, feeling weak, occasional dizziness and SOB. Sxs started 12/13/22. Pt accompanied by husband, Robert. )   5-6d h/o fatigue, weakness, dyspnea with minimal activity. Yesterday developed R sided chest pain described as tightness. Acutely worse when she woke up this morning - sharp stabbing pain below R breast, that is some better now. Mild orthostatic dizziness.   No fevers/chills, significant cough.  No personal blood clots. She thinks father had h/o DVT.  No recent prolonged period of immobility. Not on hormonal medicines.  Non smoker.  No asthma history.  Denies inciting injury/trauma.   Known diabetic on ozempic, amaryl, actos (metformin intolerance).  Lab Results  Component Value Date   HGBA1C 7.3 (H) 11/14/2022    Known OSA - not on CPAP treatment due to cost.      Relevant past medical, surgical, family and social history reviewed and updated as indicated. Interim medical history since our last visit reviewed. Allergies and medications reviewed and updated. Outpatient Medications Prior to Visit  Medication Sig Dispense Refill   albuterol (PROVENTIL) (2.5 MG/3ML) 0.083% nebulizer solution Take 3 mLs (2.5 mg total) by nebulization every 6 (six) hours as needed for wheezing  or shortness of breath. 150 mL 1   aspirin EC 81 MG tablet Take 81 mg by mouth daily with supper.     baclofen (LIORESAL) 10 MG tablet TAKE ONE TABLET BY MOUTH THREE TIMES A DAY 30 tablet 01   Blood Glucose Monitoring Suppl KIT Use to check sugar once daily 2 hours after a meal. Dx: E11.65 **Dispense per insurance guidelines** 1 each 0   buPROPion (WELLBUTRIN SR) 150 MG 12 hr tablet Take 1 tablet (150 mg total) by mouth 2 (two) times daily. 180 tablet 4   busPIRone (BUSPAR) 7.5 MG tablet Take 1 tablet (7.5 mg total) by mouth at bedtime. 90 tablet 4   Cholecalciferol (VITAMIN D) 50 MCG (2000 UT) CAPS Take 1 capsule (2,000 Units total) by mouth daily. 30 capsule    citalopram (CELEXA) 10 MG tablet Take 1 tablet (10 mg total) by mouth daily. 90 tablet 4   clobetasol ointment (TEMOVATE) 0.05 % Apply 1 application topically 2 (two) times daily. As needed 60 g 1   Coenzyme Q10 ([employer] COQ-10) 200 MG capsule Take 1 capsule (200 mg total) by mouth daily.     cyanocobalamin (VITAMIN B12) 1000 MCG/ML injection INJECT 1 ML INTO MUSCLE ONCE MONTHLY 1 mL 12   diltiazem (TIAZAC) 180 MG 24 hr capsule Take 1 capsule (180 mg total) by mouth daily. 90 capsule 4   diphenhydramine-acetaminophen (TYLENOL PM) 25-500 MG TABS tablet Take 2 tablets by mouth at bedtime as  needed (pain).     doxycycline (VIBRA-TABS) 100 MG tablet Take 1 tablet (100 mg total) by mouth 2 (two) times daily. 20 tablet 0   glimepiride (AMARYL) 4 MG tablet Take 1 tablet (4 mg total) by mouth daily with breakfast. 90 tablet 4   hydrochlorothiazide (HYDRODIURIL) 25 MG tablet TAKE 1 TABLET BY MOUTH DAILY FOR LEG SWELLING 90 tablet 4   hydrOXYzine (ATARAX) 25 MG tablet Take 1 tablet (25 mg total) by mouth 3 (three) times daily as needed. for anxiety 40 tablet 3   Magnesium 200 MG TABS Take 1 tablet (200 mg total) by mouth daily. 30 tablet    Melatonin 5 MG TABS Take 1 tablet by mouth at bedtime as needed.     meloxicam (MOBIC) 15 MG tablet TAKE 1 TABLET  BY MOUTH DAILY 30 tablet 0   metoprolol succinate (TOPROL-XL) 100 MG 24 hr tablet TAKE ONE TABLET BY MOUTH EVERY MORNING AND  EVERY NIGHT AT BEDTIME. TAKE WITH OR IMMEDIATELY FOLLOWING A MEAL 180 tablet 4   mupirocin ointment (BACTROBAN) 2 % Apply 1 Application topically 2 (two) times daily. To affected are R external ear 22 g 0   naproxen sodium (ALEVE) 220 MG tablet Take 220 mg by mouth daily as needed. Takes 2 tablets     Needle, Disp, 18G X 1.25" MISC 1 Units by Does not apply route every 30 (thirty) days. For b12 injection 10 each 1   NEEDLE, DISP, 25 G 25G X 1" MISC Use as directed - 1 needle monthly for b12 shot administration 10 each 1   omeprazole (PRILOSEC) 40 MG capsule Take 1 capsule (40 mg total) by mouth 2 (two) times daily. 180 capsule 4   OneTouch Delica Lancets 33G MISC Use to check sugar twice daily as needed. Dispense based on insurance preference. Dx: E11.65 180 each 3   ONETOUCH ULTRA test strip USE ONE STRIP TO TEST TWICE A DAY 200 strip 3   pioglitazone (ACTOS) 15 MG tablet Take 1 tablet (15 mg total) by mouth daily. 90 tablet 4   rosuvastatin (CRESTOR) 40 MG tablet TAKE 1 TABLET BY MOUTH DAILY 90 tablet 1   Semaglutide, 1 MG/DOSE, 4 MG/3ML SOPN Inject 1 mg as directed once a week. 9 mL 4   traZODone (DESYREL) 50 MG tablet TAKE ONE TABLET BY MOUTH EVERY NIGHT AT BEDTIME AS NEEDED FOR SLEEP 30 tablet 11   No facility-administered medications prior to visit.     Per HPI unless specifically indicated in ROS section below Review of Systems  Objective:  BP 122/86   Pulse 81   Temp 97.7 F (36.5 C) (Temporal)   Ht 5' 4.25" (1.632 m)   Wt 290 lb 8 oz (131.8 kg)   LMP 04/25/2014 (Approximate)   SpO2 93%   BMI 49.48 kg/m   Wt Readings from Last 3 Encounters:  12/19/22 290 lb 8 oz (131.8 kg)  11/20/22 296 lb 2 oz (134.3 kg)  07/10/22 296 lb 4 oz (134.4 kg)      Physical Exam Vitals and nursing note reviewed.  Constitutional:      Appearance: Normal appearance. She  is not ill-appearing.  HENT:     Head: Normocephalic and atraumatic.     Mouth/Throat:     Mouth: Mucous membranes are dry.     Pharynx: Oropharynx is clear. No oropharyngeal exudate or posterior oropharyngeal erythema.  Eyes:     Extraocular Movements: Extraocular movements intact.     Pupils: Pupils are equal,  round, and reactive to light.  Cardiovascular:     Rate and Rhythm: Normal rate and regular rhythm.     Pulses: Normal pulses.     Heart sounds: Normal heart sounds. No murmur heard. Pulmonary:     Effort: Pulmonary effort is normal. No respiratory distress.     Breath sounds: Normal breath sounds. No wheezing, rhonchi or rales.     Comments: No reproducible chest wall pain to palpation of sternum, costochondral joints or ribcage Chest:     Chest wall: No tenderness.  Musculoskeletal:     Right lower leg: No edema.     Left lower leg: No edema.  Skin:    General: Skin is warm and dry.     Findings: No rash.  Neurological:     Mental Status: She is alert.  Psychiatric:        Mood and Affect: Mood normal.        Behavior: Behavior normal.       Results for orders placed or performed in visit on 12/19/22  D-dimer, quantitative  Result Value Ref Range   D-Dimer, Quant 1.99 (H) <0.50 mcg/mL FEU   EKG - NSR rate 80s, mild LAD, normal intervals, no hypertrophy or acute ST/T changes, overall unchanged from prior EKG  Assessment & Plan:   Problem List Items Addressed This Visit     Right-sided chest pain - Primary    Of unclear etiology. Atypical for cardiac chest pain/angina given location and not exertional. Anticipate non-cardiac source. No reproducible component pointing against MSK cause.  Reassuring EKG.  O2 sat low 93%. Concern for pulmonary cause.  Check CXR today Stat D dimer to evaluate for PE. Discussed need for CTA chest if positive.  Update with results.  ER precautions reviewed.       Relevant Orders   EKG 12-Lead (Completed)   D-dimer,  quantitative (Completed)   DG Chest 2 View   CT Angio Chest W/Cm &/Or Wo Cm   Exertional dyspnea    O2 sat 93% RA.  No h/o asthma, non smoker. Reassuring lung exam.  Check D dimer and CXR today.       Relevant Orders   DG Chest 2 View   CT Angio Chest W/Cm &/Or Wo Cm   Other Visit Diagnoses     Positive D dimer       Relevant Orders   CT Angio Chest W/Cm &/Or Wo Cm        No orders of the defined types were placed in this encounter.   Orders Placed This Encounter  Procedures   DG Chest 2 View    Standing Status:   Future    Number of Occurrences:   1    Standing Expiration Date:   12/19/2023    Order Specific Question:   Reason for Exam (SYMPTOM  OR DIAGNOSIS REQUIRED)    Answer:   R chest pain with exertional dyspnea x1-2 days    Order Specific Question:   Is patient pregnant?    Answer:   No    Order Specific Question:   Preferred imaging location?    Answer:   [facility]   CT Angio Chest W/Cm &/Or Wo Cm    Standing Status:   Future    Standing Expiration Date:   12/19/2023    Scheduling Instructions:     Call result to cell [phone] (Dr Sharen Hones)    Order Specific Question:   If indicated for the ordered  procedure, I authorize the administration of contrast media per Radiology protocol    Answer:   Yes    Order Specific Question:   Does the patient have a contrast media/X-ray dye allergy?    Answer:   No    Order Specific Question:   Is patient pregnant?    Answer:   No    Order Specific Question:   Preferred imaging location?    Answer:   [facility]   D-dimer, quantitative   EKG 12-Lead    Patient Instructions  Xray today of chest  Pass by lab for D dimer blood test We will be in touch with results.  If recurrent or worsening symptoms, go to ER.   Follow up plan: Return if symptoms worsen or fail to improve.  Eustaquio Boyden, MD

## 2022-12-19 NOTE — Patient Instructions (Addendum)
Xray today of chest  Pass by lab for D dimer blood test We will be in touch with results.  If recurrent or worsening symptoms, go to ER.

## 2022-12-19 NOTE — Assessment & Plan Note (Addendum)
Of unclear etiology. Atypical for cardiac chest pain/angina given location and not exertional. Anticipate non-cardiac source. No reproducible component pointing against MSK cause.  Reassuring EKG.  O2 sat low 93%. Concern for pulmonary cause.  Check CXR today Stat D dimer to evaluate for PE. Discussed need for CTA chest if positive.  Update with results.  ER precautions reviewed.

## 2022-12-20 ENCOUNTER — Other Ambulatory Visit: Payer: Self-pay

## 2022-12-20 ENCOUNTER — Telehealth: Payer: Self-pay

## 2022-12-20 ENCOUNTER — Inpatient Hospital Stay
Admission: EM | Admit: 2022-12-20 | Discharge: 2022-12-23 | DRG: 164 | Disposition: A | Discharge status: Home / Self Care | Payer: 59 | Source: Ambulatory Visit | Attending: Student | Admitting: Student

## 2022-12-20 ENCOUNTER — Encounter: Payer: Self-pay | Admitting: Family Medicine

## 2022-12-20 ENCOUNTER — Ambulatory Visit
Admission: RE | Admit: 2022-12-20 | Discharge: 2022-12-20 | Disposition: A | Discharge status: Home / Self Care | Payer: 59 | Source: Ambulatory Visit | Attending: Family Medicine | Admitting: Family Medicine

## 2022-12-20 ENCOUNTER — Inpatient Hospital Stay: Payer: 59

## 2022-12-20 DIAGNOSIS — E785 Hyperlipidemia, unspecified: Secondary | ICD-10-CM | POA: Diagnosis present

## 2022-12-20 DIAGNOSIS — Z8249 Family history of ischemic heart disease and other diseases of the circulatory system: Secondary | ICD-10-CM

## 2022-12-20 DIAGNOSIS — R079 Chest pain, unspecified: Secondary | ICD-10-CM | POA: Insufficient documentation

## 2022-12-20 DIAGNOSIS — I2692 Saddle embolus of pulmonary artery without acute cor pulmonale: Secondary | ICD-10-CM | POA: Diagnosis not present

## 2022-12-20 DIAGNOSIS — R519 Headache, unspecified: Secondary | ICD-10-CM | POA: Diagnosis not present

## 2022-12-20 DIAGNOSIS — E1165 Type 2 diabetes mellitus with hyperglycemia: Secondary | ICD-10-CM | POA: Diagnosis present

## 2022-12-20 DIAGNOSIS — R0609 Other forms of dyspnea: Secondary | ICD-10-CM | POA: Insufficient documentation

## 2022-12-20 DIAGNOSIS — M549 Dorsalgia, unspecified: Secondary | ICD-10-CM | POA: Diagnosis present

## 2022-12-20 DIAGNOSIS — I272 Pulmonary hypertension, unspecified: Secondary | ICD-10-CM | POA: Diagnosis not present

## 2022-12-20 DIAGNOSIS — E559 Vitamin D deficiency, unspecified: Secondary | ICD-10-CM | POA: Diagnosis present

## 2022-12-20 DIAGNOSIS — Z6841 Body Mass Index (BMI) 40.0 and over, adult: Secondary | ICD-10-CM

## 2022-12-20 DIAGNOSIS — Z87891 Personal history of nicotine dependence: Secondary | ICD-10-CM | POA: Diagnosis not present

## 2022-12-20 DIAGNOSIS — E1169 Type 2 diabetes mellitus with other specified complication: Secondary | ICD-10-CM | POA: Diagnosis not present

## 2022-12-20 DIAGNOSIS — E118 Type 2 diabetes mellitus with unspecified complications: Secondary | ICD-10-CM | POA: Diagnosis not present

## 2022-12-20 DIAGNOSIS — Z9682 Presence of neurostimulator: Secondary | ICD-10-CM | POA: Diagnosis not present

## 2022-12-20 DIAGNOSIS — K219 Gastro-esophageal reflux disease without esophagitis: Secondary | ICD-10-CM | POA: Diagnosis not present

## 2022-12-20 DIAGNOSIS — I2699 Other pulmonary embolism without acute cor pulmonale: Secondary | ICD-10-CM | POA: Diagnosis not present

## 2022-12-20 DIAGNOSIS — F331 Major depressive disorder, recurrent, moderate: Secondary | ICD-10-CM | POA: Diagnosis not present

## 2022-12-20 DIAGNOSIS — E538 Deficiency of other specified B group vitamins: Secondary | ICD-10-CM | POA: Diagnosis present

## 2022-12-20 DIAGNOSIS — Z7982 Long term (current) use of aspirin: Secondary | ICD-10-CM

## 2022-12-20 DIAGNOSIS — Z86711 Personal history of pulmonary embolism: Secondary | ICD-10-CM | POA: Diagnosis present

## 2022-12-20 DIAGNOSIS — Z79899 Other long term (current) drug therapy: Secondary | ICD-10-CM | POA: Diagnosis not present

## 2022-12-20 DIAGNOSIS — G4733 Obstructive sleep apnea (adult) (pediatric): Secondary | ICD-10-CM | POA: Diagnosis not present

## 2022-12-20 DIAGNOSIS — R0902 Hypoxemia: Secondary | ICD-10-CM | POA: Diagnosis present

## 2022-12-20 DIAGNOSIS — Z7984 Long term (current) use of oral hypoglycemic drugs: Secondary | ICD-10-CM

## 2022-12-20 DIAGNOSIS — R0602 Shortness of breath: Secondary | ICD-10-CM | POA: Diagnosis not present

## 2022-12-20 DIAGNOSIS — Z8269 Family history of other diseases of the musculoskeletal system and connective tissue: Secondary | ICD-10-CM

## 2022-12-20 DIAGNOSIS — Z981 Arthrodesis status: Secondary | ICD-10-CM

## 2022-12-20 DIAGNOSIS — Z841 Family history of disorders of kidney and ureter: Secondary | ICD-10-CM

## 2022-12-20 DIAGNOSIS — Z888 Allergy status to other drugs, medicaments and biological substances status: Secondary | ICD-10-CM

## 2022-12-20 DIAGNOSIS — E669 Obesity, unspecified: Secondary | ICD-10-CM | POA: Diagnosis present

## 2022-12-20 DIAGNOSIS — G8929 Other chronic pain: Secondary | ICD-10-CM | POA: Diagnosis present

## 2022-12-20 DIAGNOSIS — I10 Essential (primary) hypertension: Secondary | ICD-10-CM | POA: Diagnosis not present

## 2022-12-20 DIAGNOSIS — Z8719 Personal history of other diseases of the digestive system: Secondary | ICD-10-CM

## 2022-12-20 DIAGNOSIS — F419 Anxiety disorder, unspecified: Secondary | ICD-10-CM | POA: Diagnosis present

## 2022-12-20 DIAGNOSIS — R7989 Other specified abnormal findings of blood chemistry: Secondary | ICD-10-CM

## 2022-12-20 DIAGNOSIS — I2782 Chronic pulmonary embolism: Secondary | ICD-10-CM | POA: Diagnosis not present

## 2022-12-20 DIAGNOSIS — Z800 Family history of malignant neoplasm of digestive organs: Secondary | ICD-10-CM

## 2022-12-20 DIAGNOSIS — Z833 Family history of diabetes mellitus: Secondary | ICD-10-CM

## 2022-12-20 DIAGNOSIS — Z880 Allergy status to penicillin: Secondary | ICD-10-CM

## 2022-12-20 DIAGNOSIS — M797 Fibromyalgia: Secondary | ICD-10-CM | POA: Diagnosis present

## 2022-12-20 DIAGNOSIS — Z8051 Family history of malignant neoplasm of kidney: Secondary | ICD-10-CM

## 2022-12-20 DIAGNOSIS — Z885 Allergy status to narcotic agent status: Secondary | ICD-10-CM

## 2022-12-20 DIAGNOSIS — Z791 Long term (current) use of non-steroidal anti-inflammatories (NSAID): Secondary | ICD-10-CM

## 2022-12-20 LAB — CBC
HCT: 42.1 % (ref 36.0–46.0)
Hemoglobin: 13.8 g/dL (ref 12.0–15.0)
MCH: 29.5 pg (ref 26.0–34.0)
MCHC: 32.8 g/dL (ref 30.0–36.0)
MCV: 90 fL (ref 80.0–100.0)
Platelets: 263 10*3/uL (ref 150–400)
RBC: 4.68 MIL/uL (ref 3.87–5.11)
RDW: 13.7 % (ref 11.5–15.5)
WBC: 10.3 10*3/uL (ref 4.0–10.5)
nRBC: 0 % (ref 0.0–0.2)

## 2022-12-20 LAB — BRAIN NATRIURETIC PEPTIDE: B Natriuretic Peptide: 25.5 pg/mL (ref 0.0–100.0)

## 2022-12-20 LAB — COMPREHENSIVE METABOLIC PANEL
ALT: 21 U/L (ref 0–44)
AST: 22 U/L (ref 15–41)
Albumin: 3.8 g/dL (ref 3.5–5.0)
Alkaline Phosphatase: 76 U/L (ref 38–126)
Anion gap: 8 (ref 5–15)
BUN: 9 mg/dL (ref 6–20)
CO2: 23 mmol/L (ref 22–32)
Calcium: 8.6 mg/dL — ABNORMAL LOW (ref 8.9–10.3)
Chloride: 106 mmol/L (ref 98–111)
Creatinine, Ser: 1.03 mg/dL — ABNORMAL HIGH (ref 0.44–1.00)
GFR, Estimated: >60 mL/min (ref >60)
Glucose, Bld: 141 mg/dL — ABNORMAL HIGH (ref 70–99)
Potassium: 3.6 mmol/L (ref 3.5–5.1)
Sodium: 137 mmol/L (ref 135–145)
Total Bilirubin: 0.7 mg/dL (ref 0.3–1.2)
Total Protein: 7.7 g/dL (ref 6.5–8.1)

## 2022-12-20 LAB — TROPONIN I (HIGH SENSITIVITY)
Troponin I (High Sensitivity): 3 ng/L (ref <18)
Troponin I (High Sensitivity): 3 ng/L (ref <18)

## 2022-12-20 LAB — HEPARIN LEVEL (UNFRACTIONATED): Heparin Unfractionated: 0.45 IU/mL (ref 0.30–0.70)

## 2022-12-20 LAB — PROTIME-INR
INR: 1.1 (ref 0.8–1.2)
Prothrombin Time: 14.1 seconds (ref 11.4–15.2)

## 2022-12-20 LAB — APTT: aPTT: 30 seconds (ref 24–36)

## 2022-12-20 MED ORDER — CIPROFLOXACIN IN D5W 400 MG/200ML IV SOLN
400.0000 mg | INTRAVENOUS | Status: AC
  Administered 2022-12-21: 400 mg via INTRAVENOUS
  Filled 2022-12-20 (×2): qty 200

## 2022-12-20 MED ORDER — HEPARIN (PORCINE) 25000 UT/250ML-% IV SOLN
1450.0000 [IU]/h | INTRAVENOUS | Status: DC
  Administered 2022-12-20 – 2022-12-21 (×2): 1450 [IU]/h via INTRAVENOUS
  Filled 2022-12-20 (×3): qty 250

## 2022-12-20 MED ORDER — ONDANSETRON HCL 4 MG/2ML IJ SOLN: 4.0000 mg | Freq: Four times a day (QID) | INTRAMUSCULAR | Status: DC | PRN

## 2022-12-20 MED ORDER — METOPROLOL SUCCINATE ER 100 MG PO TB24
100.0000 mg | ORAL_TABLET | Freq: Two times a day (BID) | ORAL | Status: DC
  Administered 2022-12-20 – 2022-12-23 (×6): 100 mg via ORAL
  Filled 2022-12-20 (×5): qty 1

## 2022-12-20 MED ORDER — BUPROPION HCL ER (SR) 150 MG PO TB12
150.0000 mg | ORAL_TABLET | Freq: Two times a day (BID) | ORAL | Status: DC
  Administered 2022-12-20 – 2022-12-23 (×6): 150 mg via ORAL
  Filled 2022-12-20 (×7): qty 1

## 2022-12-20 MED ORDER — ALBUTEROL SULFATE (2.5 MG/3ML) 0.083% IN NEBU: 2.5000 mg | INHALATION_SOLUTION | Freq: Four times a day (QID) | RESPIRATORY_TRACT | Status: DC | PRN

## 2022-12-20 MED ORDER — GLIMEPIRIDE 4 MG PO TABS
4.0000 mg | ORAL_TABLET | Freq: Every day | ORAL | Status: DC
  Administered 2022-12-21 – 2022-12-23 (×3): 4 mg via ORAL
  Filled 2022-12-20 (×3): qty 1

## 2022-12-20 MED ORDER — ACETAMINOPHEN 325 MG PO TABS
650.0000 mg | ORAL_TABLET | Freq: Four times a day (QID) | ORAL | Status: DC | PRN
  Administered 2022-12-21: 650 mg via ORAL
  Filled 2022-12-20: qty 2

## 2022-12-20 MED ORDER — TRAZODONE HCL 50 MG PO TABS: 50.0000 mg | ORAL_TABLET | Freq: Every evening | ORAL | Status: DC | PRN

## 2022-12-20 MED ORDER — HYDROMORPHONE HCL 1 MG/ML IJ SOLN: 1.0000 mg | Freq: Once | INTRAMUSCULAR | Status: DC | PRN

## 2022-12-20 MED ORDER — POLYETHYLENE GLYCOL 3350 17 G PO PACK: 17.0000 g | PACK | Freq: Every day | ORAL | Status: DC | PRN

## 2022-12-20 MED ORDER — CITALOPRAM HYDROBROMIDE 20 MG PO TABS
10.0000 mg | ORAL_TABLET | Freq: Every day | ORAL | Status: DC
  Administered 2022-12-20 – 2022-12-23 (×4): 10 mg via ORAL
  Filled 2022-12-20 (×4): qty 1

## 2022-12-20 MED ORDER — ROSUVASTATIN CALCIUM 10 MG PO TABS
40.0000 mg | ORAL_TABLET | Freq: Every day | ORAL | Status: DC
  Administered 2022-12-20 – 2022-12-22 (×3): 40 mg via ORAL
  Filled 2022-12-20: qty 2
  Filled 2022-12-20 (×3): qty 4

## 2022-12-20 MED ORDER — FENTANYL CITRATE PF 50 MCG/ML IJ SOSY: 12.5000 ug | PREFILLED_SYRINGE | Freq: Once | INTRAMUSCULAR | Status: DC | PRN

## 2022-12-20 MED ORDER — DIPHENHYDRAMINE HCL 50 MG/ML IJ SOLN: 50.0000 mg | Freq: Once | INTRAMUSCULAR | Status: DC | PRN

## 2022-12-20 MED ORDER — PANTOPRAZOLE SODIUM 40 MG PO TBEC
40.0000 mg | DELAYED_RELEASE_TABLET | Freq: Every day | ORAL | Status: DC
  Administered 2022-12-21 – 2022-12-22 (×2): 40 mg via ORAL
  Filled 2022-12-20 (×2): qty 1

## 2022-12-20 MED ORDER — HYDROXYZINE HCL 25 MG PO TABS: 25.0000 mg | ORAL_TABLET | Freq: Three times a day (TID) | ORAL | Status: DC | PRN

## 2022-12-20 MED ORDER — DILTIAZEM HCL ER BEADS 180 MG PO CP24
180.0000 mg | ORAL_CAPSULE | Freq: Every day | ORAL | Status: DC
  Administered 2022-12-21: 180 mg via ORAL
  Filled 2022-12-20 (×5): qty 1

## 2022-12-20 MED ORDER — FAMOTIDINE 20 MG PO TABS: 40.0000 mg | ORAL_TABLET | Freq: Once | ORAL | Status: DC | PRN

## 2022-12-20 MED ORDER — CEFAZOLIN SODIUM-DEXTROSE 2-4 GM/100ML-% IV SOLN: 2.0000 g | INTRAVENOUS | Status: DC

## 2022-12-20 MED ORDER — ONDANSETRON HCL 4 MG PO TABS: 4.0000 mg | ORAL_TABLET | Freq: Four times a day (QID) | ORAL | Status: DC | PRN

## 2022-12-20 MED ORDER — HEPARIN BOLUS VIA INFUSION
5500.0000 [IU] | Freq: Once | INTRAVENOUS | Status: AC
  Administered 2022-12-20: 5500 [IU] via INTRAVENOUS
  Filled 2022-12-20: qty 5500

## 2022-12-20 MED ORDER — BUSPIRONE HCL 15 MG PO TABS
7.5000 mg | ORAL_TABLET | Freq: Every day | ORAL | Status: DC
  Administered 2022-12-20 – 2022-12-22 (×3): 7.5 mg via ORAL
  Filled 2022-12-20 (×4): qty 1

## 2022-12-20 MED ORDER — ACETAMINOPHEN 650 MG RE SUPP: 650.0000 mg | Freq: Four times a day (QID) | RECTAL | Status: DC | PRN

## 2022-12-20 MED ORDER — SODIUM CHLORIDE 0.9 % IV SOLN: INTRAVENOUS | Status: DC

## 2022-12-20 MED ORDER — MIDAZOLAM HCL 2 MG/ML PO SYRP
8.0000 mg | ORAL_SOLUTION | Freq: Once | ORAL | Status: DC | PRN
  Filled 2022-12-20: qty 5

## 2022-12-20 MED ORDER — IOHEXOL 350 MG/ML SOLN
75.0000 mL | Freq: Once | INTRAVENOUS | Status: AC | PRN
  Administered 2022-12-20: 75 mL via INTRAVENOUS

## 2022-12-20 MED ORDER — METHYLPREDNISOLONE SODIUM SUCC 125 MG IJ SOLR: 125.0000 mg | Freq: Once | INTRAMUSCULAR | Status: DC | PRN

## 2022-12-20 MED ORDER — ASPIRIN 81 MG PO TBEC
81.0000 mg | DELAYED_RELEASE_TABLET | Freq: Every day | ORAL | Status: DC
  Administered 2022-12-21 – 2022-12-22 (×2): 81 mg via ORAL
  Filled 2022-12-20 (×2): qty 1
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
## 2022-12-20 NOTE — ED Triage Notes (Signed)
Pt here with a pulmonary embolism since last week. Pt had a CT scan today.
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
## 2022-12-20 NOTE — ED Notes (Addendum)
Pt sitting up in bed alert and oriented. RR even. NAD. Family at bedside.
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
## 2022-12-20 NOTE — Telephone Encounter (Signed)
Routing message to Dr. Para March as Dr. Reece Agar is out of the office today.   Called patient to verify she had been notified by the radiologist of her results and is heading to ED now.
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
## 2022-12-20 NOTE — Telephone Encounter (Signed)
Routed to PCP, per note patient is going to ER.

## 2022-12-20 NOTE — Telephone Encounter (Signed)
[place] radiology called reporting critical results on this patient:   CT Angio Chest W/Cm &/Or Wo Cm   She has a massive pulmonary embolism with evidence of elevated pulmonary pressure  Radiologist advised she is currently in the CT department at [facility]. He is calling the patient to advise her to go to ED immediately.

## 2022-12-20 NOTE — ED Provider Notes (Signed)
[facility] Provider Note    Event Date/Time   First MD Initiated Contact with Patient 12/20/22 1408     (approximate)   History   Shortness of Breath   HPI  Kathleen Good is a 62 y.o. female who presents with about a week of weakness and shortness of breath with exertion.  2 days ago developed right-sided chest pain described as a tightness, now resolved.  She went to her primary care doctor yesterday where D-dimer was ordered which returned positive.  In the setting of this, patient had a CTA of the chest performed today. I was actually contacted by the radiologist about this prior to patient's arrival.  He reported that this demonstrated a large clot burden including both acute and chronic embolus with dilation of the pulmonic trunk without obvious radiographic right heart strain.  Patient reports ongoing exertional dyspnea, but only mild chest discomfort with deep breaths.  No fevers.     Physical Exam   Triage Vital Signs: ED Triage Vitals  Enc Vitals Group     BP 12/20/22 1407 (!) 156/106     Pulse Rate 12/20/22 1407 86     Resp 12/20/22 1407 20     Temp 12/20/22 1407 98.1 F (36.7 C)     Temp Source 12/20/22 1407 Oral     SpO2 12/20/22 1407 95 %     Weight 12/20/22 1406 290 lb 9.1 oz (131.8 kg)     Height 12/20/22 1406 5' 4.25" (1.632 m)     Head Circumference --      Peak Flow --      Pain Score 12/20/22 1406 0     Pain Loc --      Pain Edu? --      Excl. in GC? --     Most recent vital signs: Vitals:   12/20/22 1430 12/20/22 1445  BP: (!) 139/90   Pulse: 82 84  Resp: (!) 24 17  Temp:    SpO2: 93% 92%     General: Awake, interactive  CV:  Regular rate, good peripheral perfusion.  Resp:  Lungs clear, unlabored respirations, sats in the low to mid 90s on room air. Abd:  Soft, nondistended.  Neuro:  Symmetric facial movement, fluid speech   ED Results / Procedures / Treatments   Labs (all labs ordered are listed, but only  abnormal results are displayed) Labs Reviewed  COMPREHENSIVE METABOLIC PANEL - Abnormal; Notable for the following components:      Result Value   Glucose, Bld 141 (*)    Creatinine, Ser 1.03 (*)    Calcium 8.6 (*)    All other components within normal limits  CBC  BRAIN NATRIURETIC PEPTIDE  APTT  PROTIME-INR  HEPARIN LEVEL (UNFRACTIONATED)  TROPONIN I (HIGH SENSITIVITY)  TROPONIN I (HIGH SENSITIVITY)     EKG EKG independently reviewed interpreted by myself (ER attending) demonstrates:  EKG demonstrate sinus rhythm at a rate of 84, PR 190, QRS 92, QTc 509, no acute ST changes  RADIOLOGY Imaging independently reviewed and interpreted by myself demonstrates:  Outpatient CTA reviewed, demonstrates bilateral PEs greater on the right  PROCEDURES:  Critical Care performed: Yes, see critical care procedure note(s)  CRITICAL CARE Performed by: Trinna Post   Total critical care time: 32 minutes  Critical care time was exclusive of separately billable procedures and treating other patients.  Critical care was necessary to treat or prevent imminent or life-threatening deterioration.  Critical care was time  spent personally by me on the following activities: development of treatment plan with patient and/or surrogate as well as nursing, discussions with consultants, evaluation of patient's response to treatment, examination of patient, obtaining history from patient or surrogate, ordering and performing treatments and interventions, ordering and review of laboratory studies, ordering and review of radiographic studies, pulse oximetry and re-evaluation of patient's condition.   Procedures   MEDICATIONS ORDERED IN ED: Medications  heparin ADULT infusion 100 units/mL (25000 units/29mL) (has no administration in time range)  heparin bolus via infusion 5,500 Units (has no administration in time range)     IMPRESSION / MDM / ASSESSMENT AND PLAN / ED COURSE  I reviewed the triage  vital signs and the nursing notes.  Differential diagnosis includes, but is not limited to, pulmonary embolism as noted on outpatient CT, much lower suspicion pneumonia, pneumothorax, ACS.  Patient's presentation is most consistent with acute presentation with potential threat to life or bodily function.  62 year old female presenting with exertional dyspnea for 1 week found to have PE with large clot burden on outpatient CT.  Hemodynamically stable here.  No obvious right heart strain, but I did message vascular surgery team to see if they thought she would be an appropriate thrombectomy candidate.  Heparin drip ordered.  Will reach out to hospitalist team for admission.  Case reviewed with Dr. Wyn Quaker with vascular surgery.  He will plan for thrombectomy tomorrow, patient made n.p.o. at midnight.  Case discussed with hospitalist team. They will evaluate the patient for anticipated admission.      FINAL CLINICAL IMPRESSION(S) / ED DIAGNOSES   Final diagnoses:  Acute saddle pulmonary embolism, unspecified whether acute cor pulmonale present (HCC)     Rx / DC Orders   ED Discharge Orders     None        Note:  This document was prepared using Dragon voice recognition software and may include unintentional dictation errors.   Trinna Post, MD 12/20/22 [phone]

## 2022-12-20 NOTE — ED Notes (Signed)
Pt walked to toilet in room and back. Steady gait, no dizziness.

## 2022-12-20 NOTE — ED Notes (Addendum)
Pt in via POV. Pt had CTA today showing in multiple pulmonary embolisms. Pt c/o pain upon inhalation x1 week. Some chest pain yesterday. No SOB or CP at this time. RR unlabored. Skin dry.

## 2022-12-21 ENCOUNTER — Encounter: Payer: Self-pay | Admitting: Family Medicine

## 2022-12-21 ENCOUNTER — Encounter
Admission: EM | Disposition: A | Discharge status: Home / Self Care | Payer: Self-pay | Source: Home / Self Care | Attending: Student

## 2022-12-21 DIAGNOSIS — I2699 Other pulmonary embolism without acute cor pulmonale: Secondary | ICD-10-CM | POA: Diagnosis not present

## 2022-12-21 DIAGNOSIS — I2782 Chronic pulmonary embolism: Secondary | ICD-10-CM

## 2022-12-21 DIAGNOSIS — I2692 Saddle embolus of pulmonary artery without acute cor pulmonale: Secondary | ICD-10-CM

## 2022-12-21 DIAGNOSIS — I272 Pulmonary hypertension, unspecified: Secondary | ICD-10-CM

## 2022-12-21 HISTORY — PX: PULMONARY THROMBECTOMY: CATH118295

## 2022-12-21 LAB — COMPREHENSIVE METABOLIC PANEL
ALT: 17 U/L (ref 0–44)
AST: 19 U/L (ref 15–41)
Albumin: 3.4 g/dL — ABNORMAL LOW (ref 3.5–5.0)
Alkaline Phosphatase: 65 U/L (ref 38–126)
Anion gap: 9 (ref 5–15)
BUN: 9 mg/dL (ref 6–20)
CO2: 25 mmol/L (ref 22–32)
Calcium: 8.4 mg/dL — ABNORMAL LOW (ref 8.9–10.3)
Chloride: 108 mmol/L (ref 98–111)
Creatinine, Ser: 0.88 mg/dL (ref 0.44–1.00)
GFR, Estimated: >60 mL/min (ref >60)
Glucose, Bld: 105 mg/dL — ABNORMAL HIGH (ref 70–99)
Potassium: 3.3 mmol/L — ABNORMAL LOW (ref 3.5–5.1)
Sodium: 142 mmol/L (ref 135–145)
Total Bilirubin: 0.7 mg/dL (ref 0.3–1.2)
Total Protein: 7 g/dL (ref 6.5–8.1)

## 2022-12-21 LAB — CBC
HCT: 39.4 % (ref 36.0–46.0)
Hemoglobin: 12.7 g/dL (ref 12.0–15.0)
MCH: 29.3 pg (ref 26.0–34.0)
MCHC: 32.2 g/dL (ref 30.0–36.0)
MCV: 90.8 fL (ref 80.0–100.0)
Platelets: 246 10*3/uL (ref 150–400)
RBC: 4.34 MIL/uL (ref 3.87–5.11)
RDW: 13.5 % (ref 11.5–15.5)
WBC: 10.3 10*3/uL (ref 4.0–10.5)
nRBC: 0 % (ref 0.0–0.2)

## 2022-12-21 LAB — HIV ANTIBODY (ROUTINE TESTING W REFLEX): HIV Screen 4th Generation wRfx: NONREACTIVE

## 2022-12-21 LAB — HEPARIN LEVEL (UNFRACTIONATED): Heparin Unfractionated: 0.38 IU/mL (ref 0.30–0.70)

## 2022-12-21 LAB — GLUCOSE, CAPILLARY: Glucose-Capillary: 101 mg/dL — ABNORMAL HIGH (ref 70–99)

## 2022-12-21 LAB — PROTIME-INR
INR: 1.1 (ref 0.8–1.2)
Prothrombin Time: 14.2 seconds (ref 11.4–15.2)

## 2022-12-21 LAB — APTT: aPTT: 67 seconds — ABNORMAL HIGH (ref 24–36)

## 2022-12-21 SURGERY — PULMONARY THROMBECTOMY
Anesthesia: Moderate Sedation | Laterality: Bilateral

## 2022-12-21 SURGERY — PULMONARY THROMBECTOMY
Anesthesia: Moderate Sedation

## 2022-12-21 MED ORDER — FENTANYL CITRATE (PF) 100 MCG/2ML IJ SOLN
INTRAMUSCULAR | Status: DC | PRN
  Administered 2022-12-21: 12.5 ug via INTRAVENOUS
  Administered 2022-12-21: 50 ug via INTRAVENOUS
  Administered 2022-12-21: 12.5 ug via INTRAVENOUS

## 2022-12-21 MED ORDER — MIDAZOLAM HCL 2 MG/2ML IJ SOLN
INTRAMUSCULAR | Status: DC | PRN
  Administered 2022-12-21: .5 mg via INTRAVENOUS
  Administered 2022-12-21: 2 mg via INTRAVENOUS
  Administered 2022-12-21: .5 mg via INTRAVENOUS

## 2022-12-21 MED ORDER — IODIXANOL 320 MG/ML IV SOLN
INTRAVENOUS | Status: DC | PRN
  Administered 2022-12-21: 75 mL via INTRAVENOUS

## 2022-12-21 MED ORDER — POTASSIUM CHLORIDE CRYS ER 20 MEQ PO TBCR
40.0000 meq | EXTENDED_RELEASE_TABLET | Freq: Once | ORAL | Status: AC
  Administered 2022-12-21: 40 meq via ORAL
  Filled 2022-12-21: qty 2

## 2022-12-21 MED ORDER — HEPARIN SODIUM (PORCINE) 1000 UNIT/ML IJ SOLN
INTRAMUSCULAR | Status: DC | PRN
  Administered 2022-12-21: 4000 [IU] via INTRAVENOUS

## 2022-12-21 MED ORDER — HEPARIN SODIUM (PORCINE) 1000 UNIT/ML IJ SOLN
INTRAMUSCULAR | Status: AC
  Filled 2022-12-21: qty 10

## 2022-12-21 MED ORDER — FENTANYL CITRATE (PF) 100 MCG/2ML IJ SOLN
INTRAMUSCULAR | Status: AC
  Filled 2022-12-21: qty 2

## 2022-12-21 MED ORDER — MIDAZOLAM HCL 5 MG/5ML IJ SOLN
INTRAMUSCULAR | Status: AC
  Filled 2022-12-21: qty 5

## 2022-12-21 MED ORDER — HEPARIN (PORCINE) IN NACL 1000-0.9 UT/500ML-% IV SOLN
INTRAVENOUS | Status: DC | PRN
  Administered 2022-12-21: 1000 mL

## 2022-12-21 SURGICAL SUPPLY — 18 items
CANISTER PENUMBRA ENGINE (MISCELLANEOUS) IMPLANT
CATH ANGIO 5F PIGTAIL 100CM (CATHETERS) IMPLANT
CATH INDIGO SEP 12 (CATHETERS) IMPLANT
CATH LIGHTNI FLASH 16XTORQ 100 (CATHETERS) IMPLANT
CATH LIGHTNING FLASH XTORQ 100 (CATHETERS) ×1
CATH SELECT BERN TIP 5F 130 (CATHETERS) IMPLANT
CLOSURE PERCLOSE PROSTYLE (VASCULAR PRODUCTS) IMPLANT
COVER PROBE ULTRASOUND 5X96 (MISCELLANEOUS) IMPLANT
GLIDEWIRE ADV .035X180CM (WIRE) IMPLANT
PACK ANGIOGRAPHY (CUSTOM PROCEDURE TRAY) ×2 IMPLANT
PANNUS RETENTION SYSTEM 2 PAD (MISCELLANEOUS) IMPLANT
SHEATH BRITE TIP 6FRX11 (SHEATH) IMPLANT
SHEATH DRYSEAL FLEX 16FR 33CM (SHEATH) IMPLANT
SUT MNCRL AB 4-0 PS2 18 (SUTURE) IMPLANT
SYR MEDRAD MARK 7 150ML (SYRINGE) IMPLANT
TUBING CONTRAST HIGH PRESS 72 (TUBING) IMPLANT
WIRE GUIDERIGHT .035X150 (WIRE) IMPLANT
WIRE SUPRACORE 300CM (WIRE) IMPLANT

## 2022-12-21 NOTE — TOC CM/SW Note (Signed)
CSW acknowledges consult "Could you assist with looking into the cost of apixaban and rivaroxaban for PE?" Pharmacy completed benefit check and said both are $125.  Charlynn Court, CSW [phone]

## 2022-12-21 NOTE — Op Note (Signed)
[facility]  Percutaneous Study/Intervention Procedural Note   Date of Surgery: 12/21/2022,3:04 PM  Surgeon: Festus Barren  Pre-operative Diagnosis: Symptomatic bilateral pulmonary emboli, acute on chronic with pulmonary hypertension  Post-operative diagnosis:  Same  Procedure(s) Performed:  1.  Contrast injection right heart  2.  Mechanical thrombectomy using the penumbra 16 flash catheter to the left lower lobe pulmonary artery in the right lower lobe, middle lobe, and upper lobe pulmonary arteries  4.  Selective catheter placement right lower lobe, middle lobe, and upper lobe pulmonary arteries  5.  Selective catheter placement left lower lobe and upper lobe pulmonary arteries    Anesthesia: Conscious sedation was administered under my direct supervision by the interventional radiology RN. IV Versed plus fentanyl were utilized. Continuous ECG, pulse oximetry and blood pressure was monitored throughout the entire procedure.  Versed and fentanyl were administered intravenously.  Conscious sedation was administered for a total of 32 minutes using 3 mg of Versed and 75 mcg of Fentanyl.  EBL: 500 cc  Sheath: 16 French right femoral vein  Contrast: 75 cc   Fluoroscopy Time: 6.7 minutes  Indications:  Patient presents with pulmonary emboli. The patient is symptomatic with hypoxemia and dyspnea on exertion.  There is evidence of right heart strain on the CT angiogram. The patient is otherwise a good candidate for intervention and even the long-term benefits pulmonary angiography with thrombolysis is offered. The risks and benefits are reviewed long-term benefits are discussed. All questions are answered patient agrees to proceed.  Procedure:  Kathleen Good a 62 y.o. female who was identified and appropriate procedural time out was performed.  The patient was then placed supine on the table and prepped and draped in the usual sterile fashion.  Ultrasound was used to  evaluate the right common femoral vein.  It was patent, as it was echolucent and compressible.  A digital ultrasound image was acquired for the permanent record.  A Seldinger needle was used to access the right common femoral vein under direct ultrasound guidance.  A 0.035 J wire was advanced without resistance and a 5Fr sheath was placed.  A ProGlide device was placed in a preclosed fashion and then upsized to an 16 [place] sheath.    The wire and pigtail catheter were then negotiated into the right atrium and bolus injection of contrast was utilized to demonstrate the right ventricle and the pulmonary artery outflow. The wire and catheter were then negotiated into the main pulmonary artery where hand injection of contrast was utilized to demonstrate the pulmonary arteries and confirm the locations of the pulmonary emboli.  Using the advantage wire and the pigtail catheter is able to advance first out the left lower lobe and the left upper lobe.  Selective imaging was performed showing significant thrombus burden more in the left lower lobe than the left upper lobe.  I then transition to the right side and selectively cannulated the right lower lobe, right middle lobe and right upper lobe pulmonary arteries.  Selective imaging showed extensive thrombus burden in the right main pulmonary artery extending into all 3 lobar branches.  4000 units of heparin was then given and allowed to circulate.  The Penumbra Cat 16 Flash catheter was then advanced up into the pulmonary vasculature. The right lung was addressed first. Catheter was negotiated into the right lower lobe and mechanical thrombectomy was performed with the help of the separator. Follow-up imaging demonstrated a good result and therefore the catheter was renegotiated into  the right upper lobe pulmonary artery and again mechanical thrombectomy was performed. Passes were made with both the Penumbra catheter itself as well as introducing the separator.   Finally, I was able to cannulate the right upper lobe pulmonary artery and performed mechanical thrombectomy with the penumbra CAT 16 flash catheter and the separator.  Follow-up imaging was then performed.  Marked improvement was seen on the right side with only a small amount of residual thrombus identified  The Penumbra Cat 16 Flash catheter was then negotiated to the opposite side. The left lung was then addressed. Catheter was negotiated into the left lower lobe pulmonary artery and mechanical thrombectomy was performed with the help of the separator. Follow-up imaging demonstrated a good result with only a small amount of residual thrombus in the left lower lobe pulmonary artery.  We were at roughly 500 cc of blood loss and I elected not to address the small amount of thrombus in the left upper lobe pulmonary artery.  After review these images wires were reintroduced and the catheters removed. Then, the sheath is then pulled, the ProGlide device is secured, and pressure is held. A sterile dressing is placed   Findings:   Right heart imaging:  Right atrium and right ventricle and the pulmonary outflow tract appears somewhat dilated  Right lung:  Selective imaging showed extensive thrombus burden in the right main pulmonary artery extending into all 3 lobar branches.  Left lung:  significant thrombus burden more in the left lower lobe than the left upper lobe.    Disposition: Patient was taken to the recovery room in stable condition having tolerated the procedure well.  Festus Barren 12/21/2022,3:04 PM

## 2022-12-21 NOTE — Progress Notes (Signed)
[facility] Progress Note  Patient: Kathleen Good    NGE:[id number]  DOA: 12/20/2022     Date of Service: the patient was seen and examined on 12/21/2022  Chief Complaint  Patient presents with   Shortness of Breath   Brief hospital course: Kathleen Good is a 62 y.o. female with medical history significant of T2DM, HTN, HLD, chronic back pain, depression, anxiety, migraines, OSA who presented for known pulmonary embolism.     Patient found to have a massive pulmonary embolism was directed to the ED found on outpatient CTA chest. About a week ago she felt a pain behind her left knee and she wasn't able to sleep. Patient having intermittent chest discomfort and exertional dyspnea for the past week.  Two days ago she started having pain under her right breast that felt like someone was pinching her.  She went to her PCP yesterday who had a positive D-dimer and referred her to get a CTA chest today.  Denies current chest pain but gets somewhat short of breath if speaking for too long. She has otherwise been well and has no additional concerns today.    In the ED, patient is afebrile, intermittently tachypneic and hypertensive. Satting well on room air. CBC normal. CMP glu 141, Cr 1.03, Ca 8.6 and otherwise normal. BNP unremarkable. Initial troponin 3. Patient found to have a large clot burden with dilation of the pulmonary trunk without evidence of right heart strain.  ED provider reached out to vascular surgery team and spoke with Dr. Wyn Quaker who will plan for thrombectomy tomorrow.  ED provider consulted the hospital team for evaluation for admission.    Assessment and Plan:  Pulmonary emboli CTA chest showing large burden of clot in the pulmonary arterial circulation with features of both acute and chronic emboli.  There is some dilation of the pulmonary trunk suggesting elevated pulmonary arterial pressures but no frank evidence of right heart strain.   Vascular team consulted, s/p  thrombectomy done by Dr. Wyn Quaker.    S/p thrombectomy.  Right heart imaging:  Right atrium and right ventricle and the pulmonary outflow tract appears somewhat dilated Right lung:  Selective imaging showed extensive thrombus burden in the right main pulmonary artery extending into all 3 lobar branches. Left lung:  significant thrombus burden more in the left lower lobe than the left upper lobe. -Vascular surgery team consulted -N.p.o. since for thrombectomy today -Continue heparin infusion   Non-insulin-dependent type 2 diabetes with associated dyslipidemia Hyperglycemic on admission. Glu 141.  Last A1c 7.3 in May 2024. Home medications of Ozempic 1 mg, glimepiride 4 mg -Holding SSI for now  -Continue home Glimepiride, rosuvastatin 40 mg   Fibromyalgia  Anxiety and depression Stable.  Continue home bupropion, Celexa, trazodone, hydroxyzine, BuSpar     GERD - pantoprazole per formulary    Hypertension Hypertensive on admission.  Home medications include diltiazem and metoprolol.  Patient states she is not taking hydrochlorothiazide and she only uses it if her legs are swelling. - Continue home meds    OSA  Could not afford CPAP   Body mass index is 49.19 kg/m.  Interventions:    Diet: N.p.o. DVT Prophylaxis: Therapeutic Anticoagulation with heparin IV infusion    Advance goals of care discussion: Full code  Family Communication: family was not present at bedside, at the time of interview.  The pt provided permission to discuss medical plan with the family. Opportunity was given to ask question and all questions were answered satisfactorily.  Disposition:  Pt is from Home, admitted with pulmonary embolism, vascular surgery consulted for thrombectomy today, still on IV heparin infusion , which precludes a safe discharge. Discharge to home, when cleared by vascular surgery.  Subjective: No significant events overnight, patient still feels chest pain on deep breathing 3/10,  feels a little bit improvement.  Denies any worsening of shortness of breath, no abdominal pain.  No any other complaints.  Physical Exam: General: NAD, lying comfortably Appear in no distress, affect appropriate Eyes: PERRLA ENT: Oral Mucosa Clear, moist  Neck: no JVD,  Cardiovascular: S1 and S2 Present, no Murmur,  Respiratory: good respiratory effort, Bilateral Air entry equal and Decreased, no Crackles, no wheezes Abdomen: Bowel Sound present, Soft and no tenderness,  Skin: no rashes Extremities: no Pedal edema, no calf tenderness Neurologic: without any new focal findings Gait not checked due to patient safety concerns  Vitals:   12/21/22 1446 12/21/22 1451 12/21/22 1456 12/21/22 1501  BP: (!) 143/92 (!) 157/98 (!) 166/93 131/89  Pulse: 74 72 70 69  Resp: 15 13 14 14   Temp:      TempSrc:      SpO2: 91% 92% 94% 95%  Weight:      Height:        Intake/Output Summary (Last 24 hours) at 12/21/2022 1529 Last data filed at 12/21/2022 1457 Gross per 24 hour  Intake 746.25 ml  Output 1050 ml  Net -303.75 ml   Filed Weights   12/20/22 1406 12/21/22 0426  Weight: 131.8 kg 131 kg    Data Reviewed: I have personally reviewed and interpreted daily labs, tele strips, imagings as discussed above. I reviewed all nursing notes, pharmacy notes, vitals, pertinent old records I have discussed plan of care as described above with RN and patient/family.  CBC: Recent Labs  Lab 12/20/22 1408 12/21/22 0534  WBC 10.3 10.3  HGB 13.8 12.7  HCT 42.1 39.4  MCV 90.0 90.8  PLT 263 246   Basic Metabolic Panel: Recent Labs  Lab 12/20/22 1408 12/21/22 0534  NA 137 142  K 3.6 3.3*  CL 106 108  CO2 23 25  GLUCOSE 141* 105*  BUN 9 9  CREATININE 1.03* 0.88  CALCIUM 8.6* 8.4*    Studies: PERIPHERAL VASCULAR CATHETERIZATION  Result Date: 12/21/2022 See surgical note for result.  US Venous Img Lower Bilateral (DVT)  Result Date: 12/21/2022 CLINICAL DATA:  Pulmonary embolism.  EXAM: BILATERAL LOWER EXTREMITY VENOUS DOPPLER ULTRASOUND TECHNIQUE: Gray-scale sonography with compression, as well as color and duplex ultrasound, were performed to evaluate the deep venous system(s) from the level of the common femoral vein through the popliteal and proximal calf veins. COMPARISON:  None Available. FINDINGS: VENOUS Normal compressibility of the common femoral, superficial femoral, and popliteal veins, as well as the visualized calf veins. Visualized portions of profunda femoral vein and great saphenous vein unremarkable. No filling defects to suggest DVT on grayscale or color Doppler imaging. Doppler waveforms show normal direction of venous flow, normal respiratory plasticity and response to augmentation. Limited views of the contralateral common femoral vein are unremarkable. OTHER None. Limitations: none IMPRESSION: Negative. Electronically Signed   By: Aram Candela M.D.   On: 12/21/2022 01:12    Scheduled Meds:  [MAR Hold] aspirin EC  81 mg Oral Q supper   [MAR Hold] buPROPion  150 mg Oral BID   [MAR Hold] busPIRone  7.5 mg Oral QHS   [MAR Hold] citalopram  10 mg Oral Daily   [MAR Hold] diltiazem  180 mg Oral Daily   [MAR Hold] glimepiride  4 mg Oral Q breakfast   [MAR Hold] metoprolol succinate  100 mg Oral BID WC   [MAR Hold] pantoprazole  40 mg Oral QAC breakfast   [MAR Hold] rosuvastatin  40 mg Oral Daily   Continuous Infusions:  heparin Stopped (12/21/22 1419)   PRN Meds: [MAR Hold] acetaminophen **OR** [MAR Hold] acetaminophen, [MAR Hold] albuterol, [MAR Hold] hydrOXYzine, [MAR Hold] ondansetron **OR** [MAR Hold] ondansetron (ZOFRAN) IV, [MAR Hold] polyethylene glycol, [MAR Hold] traZODone  Time spent: 35 minutes  Author: Gillis Good. MD [facility] 12/21/2022 3:29 PM  To reach On-call, see care teams to locate the attending and reach out to them via [email or web address]. If 7PM-7AM, please contact night-coverage If you still have difficulty reaching the  attending provider, please page the [facility] (Director on Call) for [facility] on amion for assistance.
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
## 2022-12-21 NOTE — Interval H&P Note (Signed)
History and Physical Interval Note:  12/21/2022 1:25 PM  Kathleen Good  has presented today for surgery, with the diagnosis of pulmonary Embolism.  The various methods of treatment have been discussed with the patient and family. After consideration of risks, benefits and other options for treatment, the patient has consented to  Procedure(s): PULMONARY THROMBECTOMY (N/A) as a surgical intervention.  The patient's history has been reviewed, patient examined, no change in status, stable for surgery.  I have reviewed the patient's chart and labs.  Questions were answered to the patient's satisfaction.     Festus Barren

## 2022-12-21 NOTE — Telephone Encounter (Signed)
Noted! Thank you

## 2022-12-22 DIAGNOSIS — I2699 Other pulmonary embolism without acute cor pulmonale: Secondary | ICD-10-CM | POA: Diagnosis not present

## 2022-12-22 LAB — CBC
HCT: 37 % (ref 36.0–46.0)
Hemoglobin: 11.9 g/dL — ABNORMAL LOW (ref 12.0–15.0)
MCH: 29.2 pg (ref 26.0–34.0)
MCHC: 32.2 g/dL (ref 30.0–36.0)
MCV: 90.9 fL (ref 80.0–100.0)
Platelets: 255 10*3/uL (ref 150–400)
RBC: 4.07 MIL/uL (ref 3.87–5.11)
RDW: 13.6 % (ref 11.5–15.5)
WBC: 10.3 10*3/uL (ref 4.0–10.5)
nRBC: 0 % (ref 0.0–0.2)

## 2022-12-22 LAB — BASIC METABOLIC PANEL
Anion gap: 7 (ref 5–15)
BUN: 11 mg/dL (ref 6–20)
CO2: 25 mmol/L (ref 22–32)
Calcium: 8.2 mg/dL — ABNORMAL LOW (ref 8.9–10.3)
Chloride: 107 mmol/L (ref 98–111)
Creatinine, Ser: 0.98 mg/dL (ref 0.44–1.00)
GFR, Estimated: >60 mL/min (ref >60)
Glucose, Bld: 148 mg/dL — ABNORMAL HIGH (ref 70–99)
Potassium: 3.7 mmol/L (ref 3.5–5.1)
Sodium: 139 mmol/L (ref 135–145)

## 2022-12-22 LAB — HEPARIN LEVEL (UNFRACTIONATED): Heparin Unfractionated: 0.45 IU/mL (ref 0.30–0.70)

## 2022-12-22 LAB — MAGNESIUM: Magnesium: 2.2 mg/dL (ref 1.7–2.4)

## 2022-12-22 LAB — PHOSPHORUS: Phosphorus: 3.7 mg/dL (ref 2.5–4.6)

## 2022-12-22 MED ORDER — APIXABAN 5 MG PO TABS: 5.0000 mg | ORAL_TABLET | Freq: Two times a day (BID) | ORAL | Status: DC

## 2022-12-22 MED ORDER — APIXABAN 5 MG PO TABS
10.0000 mg | ORAL_TABLET | Freq: Two times a day (BID) | ORAL | Status: DC
  Administered 2022-12-22 – 2022-12-23 (×3): 10 mg via ORAL
  Filled 2022-12-22 (×3): qty 2

## 2022-12-22 MED ORDER — DILTIAZEM HCL ER COATED BEADS 180 MG PO CP24
180.0000 mg | ORAL_CAPSULE | Freq: Every day | ORAL | Status: DC
  Administered 2022-12-22 – 2022-12-23 (×2): 180 mg via ORAL
  Filled 2022-12-22 (×2): qty 1
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
## 2022-12-22 NOTE — Progress Notes (Signed)
[facility] Progress Note  Patient: Kathleen Good    BJY:[id number]  DOA: 12/20/2022     Date of Service: the patient was seen and examined on 12/22/2022  Chief Complaint  Patient presents with   Shortness of Breath   Brief hospital course: LIV FOOS is a 62 y.o. female with medical history significant of T2DM, HTN, HLD, chronic back pain, depression, anxiety, migraines, OSA who presented for known pulmonary embolism.     Patient found to have a massive pulmonary embolism was directed to the ED found on outpatient CTA chest. About a week ago she felt a pain behind her left knee and she wasn't able to sleep. Patient having intermittent chest discomfort and exertional dyspnea for the past week.  Two days ago she started having pain under her right breast that felt like someone was pinching her.  She went to her PCP yesterday who had a positive D-dimer and referred her to get a CTA chest today.  Denies current chest pain but gets somewhat short of breath if speaking for too long. She has otherwise been well and has no additional concerns today.    In the ED, patient is afebrile, intermittently tachypneic and hypertensive. Satting well on room air. CBC normal. CMP glu 141, Cr 1.03, Ca 8.6 and otherwise normal. BNP unremarkable. Initial troponin 3. Patient found to have a large clot burden with dilation of the pulmonary trunk without evidence of right heart strain.  ED provider reached out to vascular surgery team and spoke with Dr. Wyn Quaker who will plan for thrombectomy tomorrow.  ED provider consulted the hospital team for evaluation for admission.    Assessment and Plan:  Pulmonary emboli CTA chest showing large burden of clot in the pulmonary arterial circulation with features of both acute and chronic emboli.  There is some dilation of the pulmonary trunk suggesting elevated pulmonary arterial pressures but no frank evidence of right heart strain.   Vascular team consulted, s/p  thrombectomy done by Dr. Wyn Quaker.    S/p thrombectomy.  Right heart imaging:  Right atrium and right ventricle and the pulmonary outflow tract appears somewhat dilated Right lung:  Selective imaging showed extensive thrombus burden in the right main pulmonary artery extending into all 3 lobar branches. Left lung:  significant thrombus burden more in the left lower lobe than the left upper lobe. -Vascular surgery consulted, s/p thrombectomy as above.  S/p heparin IV infusion, transition to Eliquis 10 mg p.o. twice daily for 7 days followed by 5 mg p.o. twice daily.  Patient was advised to follow-up with hematologist as an outpatient for hypercoagulable workup and duration of treatment.  Patient was cleared by vascular surgery to discharge. Patient still having dyspnea on exertion and using supplemental O2 inhalation so we will plan to discharge tomorrow a.m.    Non-insulin-dependent type 2 diabetes with associated dyslipidemia Hyperglycemic on admission. Glu 141.  Last A1c 7.3 in May 2024. Home medications of Ozempic 1 mg, glimepiride 4 mg -Holding SSI for now  -Continue home Glimepiride, rosuvastatin 40 mg   Fibromyalgia  Anxiety and depression Stable.  Continue home bupropion, Celexa, trazodone, hydroxyzine, BuSpar     GERD - pantoprazole per formulary    Hypertension Hypertensive on admission.  Home medications include diltiazem and metoprolol.  Patient states she is not taking hydrochlorothiazide and she only uses it if her legs are swelling. - Continue home meds    OSA  Could not afford CPAP   Body mass index is  49.19 kg/m.  Interventions:   Diet: Heart healthy diet DVT Prophylaxis: Therapeutic Anticoagulation with Eliquis     Advance goals of care discussion: Full code  Family Communication: family was not present at bedside, at the time of interview.  The pt provided permission to discuss medical plan with the family. Opportunity was given to ask question and all questions  were answered satisfactorily.   Disposition:  Pt is from Home, admitted with pulmonary embolism, vascular surgery consulted for thrombectomy today, still on IV heparin infusion , which precludes a safe discharge. Discharge to home, when clinically stable.  Most likely discharge tomorrow a.m.   Subjective: No significant events overnight, patient denies any chest pain, still has mild dyspnea on exertion but feels a lot of improvement.  Denies any active issues. Patient was advised to ambulate in the hallway and she was feeling does not exertion so when she returned to the bed she was using supplemental O2 inhalation.  Physical Exam: General: NAD, lying comfortably Appear in no distress, affect appropriate Eyes: PERRLA ENT: Oral Mucosa Clear, moist  Neck: no JVD,  Cardiovascular: S1 and S2 Present, no Murmur,  Respiratory: good respiratory effort, Bilateral Air entry equal and Decreased, no Crackles, no wheezes Abdomen: Bowel Sound present, Soft and no tenderness,  Skin: no rashes Extremities: no Pedal edema, no calf tenderness Neurologic: without any new focal findings Gait not checked due to patient safety concerns  Vitals:   12/21/22 2312 12/22/22 0258 12/22/22 0856 12/22/22 1216  BP: 125/87 116/67 118/84 125/74  Pulse: 78 73 81 73  Resp: 20 20 20 20   Temp: (!) 97.5 F (36.4 C) 97.6 F (36.4 C) 98.3 F (36.8 C) (!) 97.3 F (36.3 C)  TempSrc:      SpO2: 98% 99% 99% 99%  Weight:      Height:        Intake/Output Summary (Last 24 hours) at 12/22/2022 1416 Last data filed at 12/22/2022 4132 Gross per 24 hour  Intake 566.22 ml  Output 1050 ml  Net -483.78 ml   Filed Weights   12/20/22 1406 12/21/22 0426  Weight: 131.8 kg 131 kg    Data Reviewed: I have personally reviewed and interpreted daily labs, tele strips, imagings as discussed above. I reviewed all nursing notes, pharmacy notes, vitals, pertinent old records I have discussed plan of care as described above with  RN and patient/family.  CBC: Recent Labs  Lab 12/20/22 1408 12/21/22 0534 12/22/22 0633  WBC 10.3 10.3 10.3  HGB 13.8 12.7 11.9*  HCT 42.1 39.4 37.0  MCV 90.0 90.8 90.9  PLT 263 246 255   Basic Metabolic Panel: Recent Labs  Lab 12/20/22 1408 12/21/22 0534 12/22/22 0633  NA 137 142 139  K 3.6 3.3* 3.7  CL 106 108 107  CO2 23 25 25   GLUCOSE 141* 105* 148*  BUN 9 9 11   CREATININE 1.03* 0.88 0.98  CALCIUM 8.6* 8.4* 8.2*  MG  --   --  2.2  PHOS  --   --  3.7    Studies: PERIPHERAL VASCULAR CATHETERIZATION  Result Date: 12/21/2022 See surgical note for result.   Scheduled Meds:  apixaban  10 mg Oral BID   Followed by   Melene Muller ON 12/29/2022] apixaban  5 mg Oral BID   aspirin EC  81 mg Oral Q supper   buPROPion  150 mg Oral BID   busPIRone  7.5 mg Oral QHS   citalopram  10 mg Oral Daily   diltiazem  180 mg Oral Daily   glimepiride  4 mg Oral Q breakfast   metoprolol succinate  100 mg Oral BID WC   pantoprazole  40 mg Oral QAC breakfast   rosuvastatin  40 mg Oral Daily   Continuous Infusions:   PRN Meds: acetaminophen **OR** acetaminophen, albuterol, hydrOXYzine, ondansetron **OR** ondansetron (ZOFRAN) IV, polyethylene glycol, traZODone  Time spent: 35 minutes  Author: Gillis Santa. MD [facility] 12/22/2022 2:16 PM  To reach On-call, see care teams to locate the attending and reach out to them via [email or web address]. If 7PM-7AM, please contact night-coverage If you still have difficulty reaching the attending provider, please page the [facility] (Director on Call) for [facility] on amion for assistance.

## 2022-12-22 NOTE — Plan of Care (Signed)

## 2022-12-23 DIAGNOSIS — I2699 Other pulmonary embolism without acute cor pulmonale: Secondary | ICD-10-CM | POA: Diagnosis not present

## 2022-12-23 LAB — BASIC METABOLIC PANEL
Anion gap: 7 (ref 5–15)
BUN: 11 mg/dL (ref 6–20)
CO2: 26 mmol/L (ref 22–32)
Calcium: 8.4 mg/dL — ABNORMAL LOW (ref 8.9–10.3)
Chloride: 105 mmol/L (ref 98–111)
Creatinine, Ser: 0.92 mg/dL (ref 0.44–1.00)
GFR, Estimated: >60 mL/min (ref >60)
Glucose, Bld: 76 mg/dL (ref 70–99)
Potassium: 3.4 mmol/L — ABNORMAL LOW (ref 3.5–5.1)
Sodium: 138 mmol/L (ref 135–145)

## 2022-12-23 LAB — CBC
HCT: 36 % (ref 36.0–46.0)
Hemoglobin: 11.8 g/dL — ABNORMAL LOW (ref 12.0–15.0)
MCH: 29.9 pg (ref 26.0–34.0)
MCHC: 32.8 g/dL (ref 30.0–36.0)
MCV: 91.1 fL (ref 80.0–100.0)
Platelets: 260 10*3/uL (ref 150–400)
RBC: 3.95 MIL/uL (ref 3.87–5.11)
RDW: 13.5 % (ref 11.5–15.5)
WBC: 9.5 10*3/uL (ref 4.0–10.5)
nRBC: 0 % (ref 0.0–0.2)

## 2022-12-23 LAB — MAGNESIUM: Magnesium: 2.1 mg/dL (ref 1.7–2.4)

## 2022-12-23 LAB — PHOSPHORUS: Phosphorus: 4.4 mg/dL (ref 2.5–4.6)

## 2022-12-23 MED ORDER — APIXABAN (ELIQUIS) VTE STARTER PACK (10MG AND 5MG): ORAL_TABLET | ORAL | 0 refills | Status: DC

## 2022-12-23 MED ORDER — POTASSIUM CHLORIDE CRYS ER 20 MEQ PO TBCR
40.0000 meq | EXTENDED_RELEASE_TABLET | Freq: Once | ORAL | Status: AC
  Administered 2022-12-23: 40 meq via ORAL
  Filled 2022-12-23: qty 2

## 2022-12-23 NOTE — Discharge Summary (Signed)
[facility] Discharge Summary   Patient: Kathleen Good ZOX:[id number]  PCP: Kathleen Boyden, MD  Date of admission: 12/20/2022   Date of discharge:  12/23/2022     Discharge Diagnoses:  Principal Problem:   Pulmonary emboli [facility]) Active Problems:   MDD (major depressive disorder), recurrent episode, moderate (HCC)   GERD   Type 2 diabetes mellitus with unspecified complications (HCC)   Dyslipidemia associated with type 2 diabetes mellitus (HCC)   HYPERTENSION, BENIGN   Persistent headaches   Fibromyalgia   OSA (obstructive sleep apnea)   Admitted From: Home Disposition:  Home   Recommendations for Outpatient Follow-up:  Follow-up with PCP in 1 week, check CBC and BMP after 1 week Follow-up with vascular surgery if needed Follow-up with hematologist for hypercoagulable workup and duration of treatment. Follow up LABS/TEST:  CBC and BMP after 1 week   Diet recommendation: Cardiac diet  Activity: The patient is advised to gradually reintroduce usual activities, as tolerated  Discharge Condition: stable  Code Status: Full code   History of present illness: As per the H and P dictated on admission  Hospital Course:  Kathleen Good is a 62 y.o. female with medical history significant of T2DM, HTN, HLD, chronic back pain, depression, anxiety, migraines, OSA who presented for known pulmonary embolism.     Patient found to have a massive pulmonary embolism was directed to the ED found on outpatient CTA chest. About a week ago she felt a pain behind her left knee and she wasn't able to sleep. Patient having intermittent chest discomfort and exertional dyspnea for the past week.  Two days ago she started having pain under her right breast that felt like someone was pinching her.  She went to her PCP yesterday who had a positive D-dimer and referred her to get a CTA chest today.  Denies current chest pain but gets somewhat short of breath if speaking for too long. She has  otherwise been well and has no additional concerns today.    In the ED, patient is afebrile, intermittently tachypneic and hypertensive. Satting well on room air. CBC normal. CMP glu 141, Cr 1.03, Ca 8.6 and otherwise normal. BNP unremarkable. Initial troponin 3. Patient found to have a large clot burden with dilation of the pulmonary trunk without evidence of right heart strain.  ED provider reached out to vascular surgery team and spoke with Dr. Wyn Good who will plan for thrombectomy tomorrow.  ED provider consulted the hospital team for evaluation for admission.    Assessment and Plan:  # Pulmonary embolism:  CTA chest showing large burden of clot in the pulmonary arterial circulation with features of both acute and chronic emboli.  There is some dilation of the pulmonary trunk suggesting elevated pulmonary arterial pressures but no frank evidence of right heart strain.  Vascular team consulted, s/p thrombectomy done by Dr. Wyn Good.    S/p thrombectomy.  Right heart imaging:  Right atrium and right ventricle and the pulmonary outflow tract appears somewhat dilated Right lung:  Selective imaging showed extensive thrombus burden in the right main pulmonary artery extending into all 3 lobar branches. Left lung:  significant thrombus burden more in the left lower lobe than the left upper lobe. -Vascular surgery consulted, s/p thrombectomy as above.  S/p heparin IV infusion, transition to Eliquis 10 mg p.o. twice daily for 7 days followed by 5 mg p.o. twice daily.  Patient was advised to follow-up with hematologist as an outpatient for hypercoagulable workup and duration  of treatment.  Patient was cleared by vascular surgery to discharge.  Patient still has dyspnea on exertion, O2 sats around 92% while ambulation on room air.  Patient feels comfortable to be discharged home and follow-up with PCP.  # Non-insulin-dependent type 2 diabetes with associated dyslipidemia Hyperglycemic on admission. Glu 141.  Last  A1c 7.3 in May 2024. Home medications of Ozempic 1 mg, glimepiride 4 mg. S/p NovoLog sliding scale.  Resumed home medications on discharge, patient was advised to continue diabetic diet, monitor CBG at home.  # Fibromyalgia  Anxiety and depression. Stable.  Continue home bupropion, Celexa, trazodone, hydroxyzine, BuSpar # GERD on pantoprazole per formulary  # Hypertension: Hypertensive on admission.  Home medications include diltiazem and metoprolol.  Patient states she is not taking hydrochlorothiazide and she only uses it if her legs are swelling. Continue home meds  # OSA, Could not afford CPAP Morbid obesity, Body mass index is 49.56 kg/m.  Nutrition Interventions: Calorie restricted diet and daily exercise advised to lose body weight.  Lifestyle modification discussed.  - Patient was instructed, not to drive, operate heavy machinery, perform activities at heights, swimming or participation in water activities or provide baby sitting services while on Pain, Sleep and Anxiety Medications; until her outpatient Physician has advised to do so again.  - Also recommended to not to take more than prescribed Pain, Sleep and Anxiety Medications.  Patient was ambulatory without any assistance. On the day of the discharge the patient's vitals were stable, and no other acute medical condition were reported by patient. the patient was felt safe to be discharge at Home.  Consultants: Vascular surgery Procedures: s/p thrombectomy as above   Discharge Exam: General: Appear in no distress, no Rash; Oral Mucosa Clear, moist. Cardiovascular: S1 and S2 Present, no Murmur, Respiratory: normal respiratory effort, Bilateral Air entry present and no Crackles, no wheezes Abdomen: Bowel Sound present, Soft and no tenderness, no hernia Extremities: no Pedal edema, no calf tenderness Neurology: alert and oriented to time, place, and person affect appropriate.  Filed Weights   12/20/22 1406 12/21/22 0426  12/23/22 0431  Weight: 131.8 kg 131 kg 132 kg   Vitals:   12/23/22 0433 12/23/22 1011  BP: 121/74 112/74  Pulse: 73 79  Resp: 18 20  Temp: 97.7 F (36.5 C) 98.3 F (36.8 C)  SpO2: 98% 99%    DISCHARGE MEDICATION: Allergies as of 12/23/2022       Reactions   Penicillins Rash   PATIENT HAS HAD A PCN REACTION WITH IMMEDIATE RASH, FACIAL/TONGUE/THROAT SWELLING, SOB, OR LIGHTHEADEDNESS WITH HYPOTENSION:  #  #  #  YES  #  #  #   Has patient had a PCN reaction causing severe rash involving mucus membranes or skin necrosis: No Has patient had a PCN reaction that required hospitalization No Has patient had a PCN reaction occurring within the last 10 years: No If all of the above answers are "NO", then may proceed with Cephalosporin use.   Codeine Sulfate    REACTION: causes something like heart palpitations   Cymbalta [duloxetine Hcl] Other (See Comments)   pedal edema   Gabapentin Other (See Comments)   Visual hallucinations   Hydrocodone-guaifenesin    REACTION: heart palpitations   Lyrica [pregabalin] Other (See Comments)   oversedation   Metformin And Related Nausea Only   GI upset   Percocet [oxycodone-acetaminophen]    Heart pounding   Phenergan [promethazine Hcl] Itching        Medication List  STOP taking these medications    clobetasol ointment 0.05 % Commonly known as: TEMOVATE   doxycycline 100 MG tablet Commonly known as: VIBRA-TABS   meloxicam 15 MG tablet Commonly known as: MOBIC   naproxen sodium 220 MG tablet Commonly known as: ALEVE   pioglitazone 15 MG tablet Commonly known as: Actos       TAKE these medications    albuterol (2.5 MG/3ML) 0.083% nebulizer solution Commonly known as: PROVENTIL Take 3 mLs (2.5 mg total) by nebulization every 6 (six) hours as needed for wheezing or shortness of breath.   Apixaban Starter Pack (10mg  and 5mg ) Commonly known as: ELIQUIS STARTER PACK Take as directed on package: start with two-5mg  tablets  twice daily for 7 days. On day 8, switch to one-5mg  tablet twice daily.   aspirin EC 81 MG tablet Take 81 mg by mouth daily with supper.   baclofen 10 MG tablet Commonly known as: LIORESAL TAKE ONE TABLET BY MOUTH THREE TIMES A DAY   buPROPion 150 MG 12 hr tablet Commonly known as: WELLBUTRIN SR Take 1 tablet (150 mg total) by mouth 2 (two) times daily.   busPIRone 7.5 MG tablet Commonly known as: BUSPAR Take 1 tablet (7.5 mg total) by mouth at bedtime.   citalopram 10 MG tablet Commonly known as: CELEXA Take 1 tablet (10 mg total) by mouth daily.   [employer] CoQ-10 200 MG capsule Generic drug: Coenzyme Q10 Take 1 capsule (200 mg total) by mouth daily.   cyanocobalamin 1000 MCG/ML injection Commonly known as: VITAMIN B12 INJECT 1 ML INTO MUSCLE ONCE MONTHLY   diltiazem 180 MG 24 hr capsule Commonly known as: TIAZAC Take 1 capsule (180 mg total) by mouth daily.   diphenhydramine-acetaminophen 25-500 MG Tabs tablet Commonly known as: TYLENOL PM Take 2 tablets by mouth at bedtime as needed (pain).   glimepiride 4 MG tablet Commonly known as: AMARYL Take 1 tablet (4 mg total) by mouth daily with breakfast.   hydrochlorothiazide 25 MG tablet Commonly known as: HYDRODIURIL TAKE 1 TABLET BY MOUTH DAILY FOR LEG SWELLING   hydrOXYzine 25 MG tablet Commonly known as: ATARAX Take 1 tablet (25 mg total) by mouth 3 (three) times daily as needed. for anxiety   Magnesium 200 MG Tabs Take 1 tablet (200 mg total) by mouth daily.   melatonin 5 MG Tabs Take 10 mg by mouth at bedtime as needed.   metoprolol succinate 100 MG 24 hr tablet Commonly known as: TOPROL-XL TAKE ONE TABLET BY MOUTH EVERY MORNING AND  EVERY NIGHT AT BEDTIME. TAKE WITH OR IMMEDIATELY FOLLOWING A MEAL   mupirocin ointment 2 % Commonly known as: BACTROBAN Apply 1 Application topically 2 (two) times daily. To affected are R external ear   omeprazole 40 MG capsule Commonly known as: PRILOSEC Take 1 capsule  (40 mg total) by mouth 2 (two) times daily.   OneTouch Ultra test strip Generic drug: glucose blood USE ONE STRIP TO TEST TWICE A DAY   rosuvastatin 40 MG tablet Commonly known as: CRESTOR TAKE 1 TABLET BY MOUTH DAILY   Semaglutide (1 MG/DOSE) 4 MG/3ML Sopn Inject 1 mg as directed once a week.   traZODone 50 MG tablet Commonly known as: DESYREL TAKE ONE TABLET BY MOUTH EVERY NIGHT AT BEDTIME AS NEEDED FOR SLEEP   Vitamin D 50 MCG (2000 UT) Caps Take 1 capsule (2,000 Units total) by mouth daily.       Allergies  Allergen Reactions   Penicillins Rash    PATIENT  HAS HAD A PCN REACTION WITH IMMEDIATE RASH, FACIAL/TONGUE/THROAT SWELLING, SOB, OR LIGHTHEADEDNESS WITH HYPOTENSION:  #  #  #  YES  #  #  #   Has patient had a PCN reaction causing severe rash involving mucus membranes or skin necrosis: No Has patient had a PCN reaction that required hospitalization No Has patient had a PCN reaction occurring within the last 10 years: No If all of the above answers are "NO", then may proceed with Cephalosporin use.   Codeine Sulfate     REACTION: causes something like heart palpitations   Cymbalta [Duloxetine Hcl] Other (See Comments)    pedal edema   Gabapentin Other (See Comments)    Visual hallucinations   Hydrocodone-Guaifenesin     REACTION: heart palpitations   Lyrica [Pregabalin] Other (See Comments)    oversedation   Metformin And Related Nausea Only    GI upset   Percocet [Oxycodone-Acetaminophen]     Heart pounding   Phenergan [Promethazine Hcl] Itching   Discharge Instructions     Ambulatory referral to Hematology / Oncology   Complete by: As directed    Call MD for:   Complete by: As directed    Any bleeding or bruises   Call MD for:  difficulty breathing, headache or visual disturbances   Complete by: As directed    Call MD for:  difficulty breathing, headache or visual disturbances   Complete by: As directed    Call MD for:  extreme fatigue   Complete by:  As directed    Call MD for:  extreme fatigue   Complete by: As directed    Call MD for:  persistant dizziness or light-headedness   Complete by: As directed    Call MD for:  persistant dizziness or light-headedness   Complete by: As directed    Call MD for:  persistant nausea and vomiting   Complete by: As directed    Call MD for:  persistant nausea and vomiting   Complete by: As directed    Call MD for:  severe uncontrolled pain   Complete by: As directed    Call MD for:  severe uncontrolled pain   Complete by: As directed    Call MD for:  temperature >100.4   Complete by: As directed    Call MD for:  temperature >100.4   Complete by: As directed    Diet - low sodium heart healthy   Complete by: As directed    Diet - low sodium heart healthy   Complete by: As directed    Discharge instructions   Complete by: As directed    F/u with PCP in 1 wk F/u with hematologist in 1-2 wks for further w/up and duration of DOAC   Discharge instructions   Complete by: As directed    Follow-up with PCP in 1 week, check CBC and BMP after 1 week Follow-up with vascular surgery if needed Follow-up with hematologist for hypercoagulable workup and duration of treatment.   Increase activity slowly   Complete by: As directed    Increase activity slowly   Complete by: As directed        The results of significant diagnostics from this hospitalization (including imaging, microbiology, ancillary and laboratory) are listed below for reference.    Significant Diagnostic Studies: PERIPHERAL VASCULAR CATHETERIZATION  Result Date: 12/21/2022 See surgical note for result.  US Venous Img Lower Bilateral (DVT)  Result Date: 12/21/2022 CLINICAL DATA:  Pulmonary embolism. EXAM: BILATERAL LOWER EXTREMITY  VENOUS DOPPLER ULTRASOUND TECHNIQUE: Gray-scale sonography with compression, as well as color and duplex ultrasound, were performed to evaluate the deep venous system(s) from the level of the common  femoral vein through the popliteal and proximal calf veins. COMPARISON:  None Available. FINDINGS: VENOUS Normal compressibility of the common femoral, superficial femoral, and popliteal veins, as well as the visualized calf veins. Visualized portions of profunda femoral vein and great saphenous vein unremarkable. No filling defects to suggest DVT on grayscale or color Doppler imaging. Doppler waveforms show normal direction of venous flow, normal respiratory plasticity and response to augmentation. Limited views of the contralateral common femoral vein are unremarkable. OTHER None. Limitations: none IMPRESSION: Negative. Electronically Signed   By: Aram Candela M.D.   On: 12/21/2022 01:12   CT Angio Chest W/Cm &/Or Wo Cm  Result Date: 12/20/2022 CLINICAL DATA:  62 year old female with history of right-sided chest pain and exertional dyspnea for the past 1-2 days. EXAM: CT ANGIOGRAPHY CHEST WITH CONTRAST TECHNIQUE: Multidetector CT imaging of the chest was performed using the standard protocol during bolus administration of intravenous contrast. Multiplanar CT image reconstructions and MIPs were obtained to evaluate the vascular anatomy. RADIATION DOSE REDUCTION: This exam was performed according to the departmental dose-optimization program which includes automated exposure control, adjustment of the mA and/or kV according to patient size and/or use of iterative reconstruction technique. CONTRAST:  75mL OMNIPAQUE IOHEXOL 350 MG/ML SOLN COMPARISON:  Chest CTA 09/09/2014. FINDINGS: Cardiovascular: There are numerous filling defects within the pulmonary arterial tree bilaterally indicative of widespread pulmonary embolism. Some of this is centrally located, including bulky clot in the right main pulmonary artery extending into lobar, segmental and subsegmental sized pulmonary artery branches throughout the right lung. In addition, in the left lung there is eccentrically located thrombus, suggesting some  chronicity. A portion of this thrombus extends to the level of the pulmonary artery bifurcation (i.e., there is a small saddle embolus). Pulmonic trunk appears dilated measuring 3.7 cm in diameter. Estimated right ventricular diameter 40 mm. Estimated left ventricular diameter 48 mm. RV to LV ratio of 0.83. Overall, heart size is borderline enlarged. There is no significant pericardial fluid, thickening or pericardial calcification. There is aortic atherosclerosis, as well as atherosclerosis of the great vessels of the mediastinum and the coronary arteries, including calcified atherosclerotic plaque in the left anterior descending, left circumflex and right coronary arteries. Mediastinum/Nodes: No pathologically enlarged mediastinal or hilar lymph nodes. Esophagus is unremarkable in appearance. No axillary lymphadenopathy. Lungs/Pleura: No acute consolidative airspace disease. Trace right pleural effusion. No left pleural effusion. No suspicious appearing pulmonary nodules or masses are noted. Upper Abdomen: Unremarkable. Musculoskeletal: There are no aggressive appearing lytic or blastic lesions noted in the visualized portions of the skeleton. Spinal cord stimulator noted in the lower thoracic region. Orthopedic fixation hardware in the lower cervical spine partially imaged. Review of the MIP images confirms the above findings. IMPRESSION: 1. Today's study is positive for a large burden of clot in the pulmonary artery circulation, as detailed above, with imaging features of both acute and chronic embolus. There is dilatation of the pulmonic trunk (3.7 cm in diameter), suggesting elevated pulmonary artery pressures, but no frank imaging findings to suggest right heart strain at this time. 2. Trace right-sided pleural effusion. 3. Aortic atherosclerosis, in addition to three-vessel coronary artery disease. Please note that although the presence of coronary artery calcium documents the presence of coronary artery  disease, the severity of this disease and any potential stenosis cannot be assessed  on this non-gated CT examination. Assessment for potential risk factor modification, dietary therapy or pharmacologic therapy may be warranted, if clinically indicated. Critical Value/emergent results were attempted to be called by telephone at the time of interpretation on 12/20/2022 at 1:23 pm to provider Kathleen Good, however, the provider was unavailable. These findings were discussed with his assistant Ninfa Meeker CMA on 12/20/2022 at 1:38 p.m. These findings were also called to the CT department at [facility], and the patient was directed to the Emergency Room for further clinical evaluation. These findings were also discussed by phone with the emergency room physician Dr. Rosalia Hammers at 1:43 p.m. Aortic Atherosclerosis (ICD10-I70.0). Electronically Signed   By: Trudie Reed M.D.   On: 12/20/2022 13:45   DG Chest 2 View  Result Date: 12/20/2022 CLINICAL DATA:  62 year old female with history of right-sided chest pain and exertional dyspnea for the past 1-2 days. EXAM: CHEST - 2 VIEW COMPARISON:  Chest x-ray 07/20/2016. FINDINGS: Lung volumes are normal. No consolidative airspace disease. No pleural effusions. No pneumothorax. No pulmonary nodule or mass noted. Pulmonary vasculature and the cardiomediastinal silhouette are within normal limits. Atherosclerosis in the thoracic aorta. Spinal cord stimulator noted in the lower thoracic region. Orthopedic fixation hardware in the lower cervical spine incidentally noted. IMPRESSION: 1. No radiographic evidence of acute cardiopulmonary disease. 2. Aortic atherosclerosis. Electronically Signed   By: Trudie Reed M.D.   On: 12/20/2022 13:26    Microbiology: No results found for this or any previous visit (from the past 240 hour(s)).   Labs: CBC: Recent Labs  Lab 12/20/22 1408 12/21/22 0534 12/22/22 0633 12/23/22 0506  WBC 10.3 10.3 10.3 9.5   HGB 13.8 12.7 11.9* 11.8*  HCT 42.1 39.4 37.0 36.0  MCV 90.0 90.8 90.9 91.1  PLT 263 246 255 260   Basic Metabolic Panel: Recent Labs  Lab 12/20/22 1408 12/21/22 0534 12/22/22 0633 12/23/22 0506  NA 137 142 139 138  K 3.6 3.3* 3.7 3.4*  CL 106 108 107 105  CO2 23 25 25 26   GLUCOSE 141* 105* 148* 76  BUN 9 9 11 11   CREATININE 1.03* 0.88 0.98 0.92  CALCIUM 8.6* 8.4* 8.2* 8.4*  MG  --   --  2.2 2.1  PHOS  --   --  3.7 4.4   Liver Function Tests: Recent Labs  Lab 12/20/22 1408 12/21/22 0534  AST 22 19  ALT 21 17  ALKPHOS 76 65  BILITOT 0.7 0.7  PROT 7.7 7.0  ALBUMIN 3.8 3.4*   No results for input(s): "LIPASE", "AMYLASE" in the last 168 hours. No results for input(s): "AMMONIA" in the last 168 hours. Cardiac Enzymes: No results for input(s): "CKTOTAL", "CKMB", "CKMBINDEX", "TROPONINI" in the last 168 hours. BNP (last 3 results) Recent Labs    12/20/22 1408  BNP 25.5   CBG: Recent Labs  Lab 12/21/22 1251  GLUCAP 101*    Time spent: 35 minutes  Signed:  Gillis Santa  [facility] 12/23/2022 12:15 PM

## 2022-12-23 NOTE — Plan of Care (Signed)

## 2022-12-24 ENCOUNTER — Encounter: Payer: Self-pay | Admitting: Vascular Surgery

## 2022-12-24 ENCOUNTER — Telehealth: Payer: Self-pay

## 2022-12-24 NOTE — Transitions of Care (Post Inpatient/ED Visit) (Unsigned)
   12/24/2022  Name: Kathleen Good MRN: [id number] DOB: 12-20-1960  Today's TOC FU Call Status: Today's TOC FU Call Status:: Unsuccessul Call (1st Attempt) Unsuccessful Call (1st Attempt) Date: 12/24/22  Attempted to reach the patient regarding the most recent Inpatient/ED visit.  Follow Up Plan: Additional outreach attempts will be made to reach the patient to complete the Transitions of Care (Post Inpatient/ED visit) call.   Signature  Woodfin Ganja LPN [facility] Nurse Health Advisor Direct Dial [phone]

## 2022-12-25 NOTE — Transitions of Care (Post Inpatient/ED Visit) (Signed)
12/25/2022  Name: Kathleen Good MRN: [id number] DOB: [birth year]  Today's TOC FU Call Status: Today's TOC FU Call Status:: Successful TOC FU Call Competed Unsuccessful Call (1st Attempt) Date: 12/24/22 [facility] FU Call Complete Date: 12/25/22  Transition Care Management Follow-up Telephone Call Date of Discharge: 12/23/22 Discharge Facility: [facility]) Type of Discharge: Inpatient Admission Primary Inpatient Discharge Diagnosis:: Pulmonary emboli How have you been since you were released from the hospital?: Better Any questions or concerns?: No  Items Reviewed: Did you receive and understand the discharge instructions provided?: Yes Medications obtained,verified, and reconciled?: Yes (Medications Reviewed) Any new allergies since your discharge?: No Dietary orders reviewed?: Yes Do you have support at home?: Yes  Medications Reviewed Today: Medications Reviewed Today     Reviewed by Merleen Nicely, LPN (Licensed Practical Nurse) on 12/25/22 at 1114  Med List Status: <None>   Medication Order Taking? Sig Documenting Provider Last Dose Status Informant  albuterol (PROVENTIL) (2.5 MG/3ML) 0.083% nebulizer solution [id number] Yes Take 3 mLs (2.5 mg total) by nebulization every 6 (six) hours as needed for wheezing or shortness of breath. Eustaquio Boyden, MD Taking Active Self  APIXABAN Everlene Balls) VTE STARTER PACK (10MG  AND 5MG ) [id number] Yes Take as directed on package: start with two-5mg  tablets twice daily for 7 days. On day 8, switch to one-5mg  tablet twice daily. Gillis Santa, MD Taking Active   aspirin EC 81 MG tablet [id number] Yes Take 81 mg by mouth daily with supper. [provider] Taking Active Self  baclofen (LIORESAL) 10 MG tablet [id number] Yes TAKE ONE TABLET BY MOUTH THREE TIMES A Rosalee Kaufman, MD Taking Active Self  buPROPion [facility] SR) 150 MG 12 hr tablet [id number] Yes Take 1 tablet (150 mg total) by mouth 2 (two) times  daily. Eustaquio Boyden, MD Taking Active Self  busPIRone (BUSPAR) 7.5 MG tablet [id number] Yes Take 1 tablet (7.5 mg total) by mouth at bedtime. Eustaquio Boyden, MD Taking Active Self  Cholecalciferol (VITAMIN D) 50 MCG (2000 UT) CAPS [id number] Yes Take 1 capsule (2,000 Units total) by mouth daily. Eustaquio Boyden, MD Taking Active Self  citalopram (CELEXA) 10 MG tablet [id number] Yes Take 1 tablet (10 mg total) by mouth daily. Eustaquio Boyden, MD Taking Active Self  Coenzyme Q10 ([facility] COQ-10) 200 MG capsule [id number] Yes Take 1 capsule (200 mg total) by mouth daily. Eustaquio Boyden, MD Taking Active Self  cyanocobalamin (VITAMIN B12) 1000 MCG/ML injection [id number] Yes INJECT 1 ML INTO MUSCLE ONCE Aris Georgia, MD Taking Active Self  diltiazem [facility]) 180 MG 24 hr capsule [id number] Yes Take 1 capsule (180 mg total) by mouth daily. Eustaquio Boyden, MD Taking Active Self  diphenhydramine-acetaminophen (TYLENOL PM) 25-500 MG TABS tablet [id number] Yes Take 2 tablets by mouth at bedtime as needed (pain). Eustaquio Boyden, MD Taking Active Self  glimepiride (AMARYL) 4 MG tablet [id number] Yes Take 1 tablet (4 mg total) by mouth daily with breakfast. Eustaquio Boyden, MD Taking Active Self  hydrochlorothiazide (HYDRODIURIL) 25 MG tablet [id number] Yes TAKE 1 TABLET BY MOUTH DAILY FOR LEG SWELLING Eustaquio Boyden, MD Taking Active Self  hydrOXYzine (ATARAX) 25 MG tablet [id number] Yes Take 1 tablet (25 mg total) by mouth 3 (three) times daily as needed. for anxiety Eustaquio Boyden, MD Taking Active Self  Magnesium 200 MG TABS [id number] Yes Take 1 tablet (200 mg total) by mouth daily. Eustaquio Boyden, MD Taking Active Self  Melatonin 5 MG TABS [id number] Yes Take 10 mg by mouth at bedtime  as needed. [provider] Taking Active Self  metoprolol succinate (TOPROL-XL) 100 MG 24 hr tablet [id number] Yes TAKE ONE TABLET BY MOUTH EVERY MORNING AND  EVERY NIGHT AT BEDTIME. TAKE  WITH OR IMMEDIATELY FOLLOWING A MEAL Eustaquio Boyden, MD Taking Active Self  mupirocin ointment (BACTROBAN) 2 % [id number] Yes Apply 1 Application topically 2 (two) times daily. To affected are R external ear Eustaquio Boyden, MD Taking Active Self  omeprazole (PRILOSEC) 40 MG capsule [id number] Yes Take 1 capsule (40 mg total) by mouth 2 (two) times daily. Eustaquio Boyden, MD Taking Active Self  [facility] ULTRA test strip [id number] Yes USE ONE STRIP TO TEST TWICE A Rosalee Kaufman, MD Taking Active Self  rosuvastatin (CRESTOR) 40 MG tablet [id number] Yes TAKE 1 TABLET BY MOUTH DAILY Eustaquio Boyden, MD Taking Active Self  Semaglutide, 1 MG/DOSE, 4 MG/3ML SOPN [id number] Yes Inject 1 mg as directed once a week. Eustaquio Boyden, MD Taking Active Self  traZODone (DESYREL) 50 MG tablet [id number] Yes TAKE ONE TABLET BY MOUTH EVERY NIGHT AT BEDTIME AS NEEDED FOR SLEEP Eustaquio Boyden, MD Taking Active Self            Home Care and Equipment/Supplies: Were Home Health Services Ordered?: No Any new equipment or medical supplies ordered?: No  Functional Questionnaire: Do you need assistance with bathing/showering or dressing?: No Do you need assistance with meal preparation?: No Do you need assistance with eating?: No Do you have difficulty maintaining continence: No Do you need assistance with getting out of bed/getting out of a chair/moving?: No Do you have difficulty managing or taking your medications?: No  Follow up appointments reviewed: PCP Follow-up appointment confirmed?: Yes Date of PCP follow-up appointment?: 01/01/23 Follow-up Provider: Dr Sharen Hones [facility] Follow-up appointment confirmed?: Yes Date of Specialist follow-up appointment?: 12/28/22 Follow-Up Specialty Provider:: hematologist Do you need transportation to your follow-up appointment?: No Do you understand care options if your condition(s) worsen?: Yes-patient verbalized  understanding    SIGNATURE  Woodfin Ganja LPN [facility] Nurse Health Advisor Direct Dial [phone]

## 2022-12-28 ENCOUNTER — Inpatient Hospital Stay: Payer: 59 | Attending: Oncology | Admitting: Oncology

## 2022-12-28 ENCOUNTER — Encounter: Payer: Self-pay | Admitting: Oncology

## 2022-12-28 ENCOUNTER — Inpatient Hospital Stay: Payer: 59

## 2022-12-28 VITALS — BP 123/82 | HR 72 | Temp 96.0°F | Resp 16 | Ht 64.25 in | Wt 289.0 lb

## 2022-12-28 DIAGNOSIS — Z803 Family history of malignant neoplasm of breast: Secondary | ICD-10-CM | POA: Insufficient documentation

## 2022-12-28 DIAGNOSIS — Z6841 Body Mass Index (BMI) 40.0 and over, adult: Secondary | ICD-10-CM | POA: Insufficient documentation

## 2022-12-28 DIAGNOSIS — Z8051 Family history of malignant neoplasm of kidney: Secondary | ICD-10-CM | POA: Insufficient documentation

## 2022-12-28 DIAGNOSIS — I2699 Other pulmonary embolism without acute cor pulmonale: Secondary | ICD-10-CM | POA: Diagnosis present

## 2022-12-28 DIAGNOSIS — R5383 Other fatigue: Secondary | ICD-10-CM | POA: Insufficient documentation

## 2022-12-28 DIAGNOSIS — G4733 Obstructive sleep apnea (adult) (pediatric): Secondary | ICD-10-CM | POA: Insufficient documentation

## 2022-12-28 DIAGNOSIS — Z7901 Long term (current) use of anticoagulants: Secondary | ICD-10-CM | POA: Diagnosis not present

## 2022-12-28 DIAGNOSIS — Z87891 Personal history of nicotine dependence: Secondary | ICD-10-CM | POA: Insufficient documentation

## 2022-12-28 DIAGNOSIS — Z139 Encounter for screening, unspecified: Secondary | ICD-10-CM

## 2022-12-28 DIAGNOSIS — Z800 Family history of malignant neoplasm of digestive organs: Secondary | ICD-10-CM | POA: Insufficient documentation

## 2022-12-28 LAB — ANTITHROMBIN III: AntiThromb III Func: 7 % — ABNORMAL LOW (ref 75–120)

## 2022-12-28 NOTE — Assessment & Plan Note (Signed)
OSA not on CPAP.  I think the fatigue may be secondary to untreated OSA

## 2022-12-28 NOTE — Assessment & Plan Note (Addendum)
Acute unprovoked bilateral pulmonary embolism status post thrombectomy Recommend patient to continue therapeutic Eliquis 10 mg twice daily for total 7 days and then switch to 5 mg twice daily. Check hypercoagulable workup cardiolipin antibody, beta glycoprotein antibodies, prothrombin gene mutation, factor V Leiden mutation, Antithrombin III. Discussed with patient that given the PE is unprovoked, recommend patient to continue Eliquis 5mg  BID.  Depending on hypercoagulable work up  I will decide her anticoagulation regimen after she finishes 6 months of AC  Addendum, she has decrease AT III. Will repeat AT III level at next visit.
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
## 2022-12-29 LAB — BETA-2-GLYCOPROTEIN I ABS, IGG/M/A
Beta-2 Glyco I IgG: <9 GPI IgG units (ref 0–20)
Beta-2-Glycoprotein I IgA: <9 GPI IgA units (ref 0–25)
Beta-2-Glycoprotein I IgM: <9 GPI IgM units (ref 0–32)

## 2022-12-30 LAB — ANTIPHOSPHOLIPID SYNDROME PROF
Anticardiolipin IgG: <9 GPL U/mL (ref 0–14)
Anticardiolipin IgM: <9 [MPL'U]/mL (ref 0–12)
DRVVT: 56 s — ABNORMAL HIGH (ref 0.0–47.0)
PTT Lupus Anticoagulant: 37.9 s (ref 0.0–43.5)

## 2022-12-30 LAB — DRVVT MIX: dRVVT Mix: 45.8 s — ABNORMAL HIGH (ref 0.0–40.4)

## 2022-12-30 LAB — DRVVT CONFIRM: dRVVT Confirm: 0.7 ratio — ABNORMAL LOW (ref 0.8–1.2)

## 2022-12-31 ENCOUNTER — Inpatient Hospital Stay: Payer: 59

## 2022-12-31 NOTE — Progress Notes (Signed)
[facility] Clinical Social Work  Initial Assessment   Kathleen Good is a 62 y.o. year old female contacted by phone. Clinical Social Work was referred by medical provider for assessment of psychosocial needs.   SDOH (Social Determinants of Health) assessments performed: Yes SDOH Interventions    Flowsheet Row Office Visit from 10/05/2020 in [facility] [place]  SDOH Interventions   Depression Interventions/Treatment  Medication, Currently on Treatment       SDOH Screenings   Food Insecurity: No Food Insecurity (12/28/2022)  Housing: Low Risk  (12/28/2022)  Transportation Needs: No Transportation Needs (12/28/2022)  Utilities: Not At Risk (12/28/2022)  Depression (PHQ2-9): Low Risk  (12/28/2022)  Recent Concern: Depression (PHQ2-9) - High Risk (12/19/2022)  Tobacco Use: Medium Risk (12/28/2022)     Distress Screen completed: No     No data to display            Family/Social Information:  Housing Arrangement: Patient lives with her husband, Molly Maduro, and her mother. Family members/support persons in your life? Family Transportation concerns: no  Employment: Unemployed  Income source: Supported by [employer] and [employer] Financial concerns: No Type of concern: None Food access concerns: no Religious or spiritual practice: Patient identifies as Control and instrumentation engineer. Services Currently in place:  [facility] through her husband.  Coping/ Adjustment to diagnosis: Patient understands treatment plan and what happens next? yes Concerns about diagnosis and/or treatment: I'm not especially worried about anything Patient reported stressors:  Patient did not identify any. Hopes and/or priorities: Family Patient enjoys time with family/ friends Current coping skills/ strengths: Capable of independent living , Manufacturing systems engineer , General fund of knowledge , and Supportive family/friends     SUMMARY: Current SDOH Barriers:  Patient denied any barriers.  She stated she has had  depression for years and takes an antidepressant.  She declines therapy referral at this time.  Clinical Social Work Clinical Goal(s):  No clinical social work goals at this time  Interventions: Discussed common feeling and emotions when being diagnosed with cancer, and the importance of support during treatment Informed patient of the support team roles and support services at [facility] Provided CSW contact information and encouraged patient to call with any questions or concerns Provided patient with information about CSW role while patient is not in treatment.   Follow Up Plan: Patient will contact CSW with any support or resource needs Patient verbalizes understanding of plan: Yes    Dorothey Baseman, LCSW Clinical Social Worker [facility]

## 2023-01-01 ENCOUNTER — Encounter: Payer: Self-pay | Admitting: Family Medicine

## 2023-01-01 ENCOUNTER — Ambulatory Visit: Payer: 59 | Admitting: Family Medicine

## 2023-01-01 VITALS — BP 122/80 | HR 80 | Temp 97.5°F | Ht 64.25 in | Wt 292.0 lb

## 2023-01-01 DIAGNOSIS — R5382 Chronic fatigue, unspecified: Secondary | ICD-10-CM

## 2023-01-01 DIAGNOSIS — Z7985 Long-term (current) use of injectable non-insulin antidiabetic drugs: Secondary | ICD-10-CM

## 2023-01-01 DIAGNOSIS — D6859 Other primary thrombophilia: Secondary | ICD-10-CM | POA: Diagnosis not present

## 2023-01-01 DIAGNOSIS — I2692 Saddle embolus of pulmonary artery without acute cor pulmonale: Secondary | ICD-10-CM

## 2023-01-01 DIAGNOSIS — F331 Major depressive disorder, recurrent, moderate: Secondary | ICD-10-CM

## 2023-01-01 DIAGNOSIS — E118 Type 2 diabetes mellitus with unspecified complications: Secondary | ICD-10-CM

## 2023-01-01 DIAGNOSIS — Z7984 Long term (current) use of oral hypoglycemic drugs: Secondary | ICD-10-CM

## 2023-01-01 DIAGNOSIS — Z636 Dependent relative needing care at home: Secondary | ICD-10-CM

## 2023-01-01 DIAGNOSIS — K5903 Drug induced constipation: Secondary | ICD-10-CM

## 2023-01-01 DIAGNOSIS — Z599 Problem related to housing and economic circumstances, unspecified: Secondary | ICD-10-CM

## 2023-01-01 DIAGNOSIS — F4321 Adjustment disorder with depressed mood: Secondary | ICD-10-CM | POA: Insufficient documentation

## 2023-01-01 LAB — IBC PANEL
Iron: 51 ug/dL (ref 42–145)
Saturation Ratios: 12.9 % — ABNORMAL LOW (ref 20.0–50.0)
TIBC: 396.2 ug/dL (ref 250.0–450.0)
Transferrin: 283 mg/dL (ref 212.0–360.0)

## 2023-01-01 LAB — CBC WITH DIFFERENTIAL/PLATELET
Basophils Absolute: 0.1 10*3/uL (ref 0.0–0.1)
Basophils Relative: 1.3 % (ref 0.0–3.0)
Eosinophils Absolute: 0.2 10*3/uL (ref 0.0–0.7)
Eosinophils Relative: 2.7 % (ref 0.0–5.0)
HCT: 37 % (ref 36.0–46.0)
Hemoglobin: 12 g/dL (ref 12.0–15.0)
Lymphocytes Relative: 34.7 % (ref 12.0–46.0)
Lymphs Abs: 2.3 10*3/uL (ref 0.7–4.0)
MCHC: 32.5 g/dL (ref 30.0–36.0)
MCV: 90.3 fl (ref 78.0–100.0)
Monocytes Absolute: 0.6 10*3/uL (ref 0.1–1.0)
Monocytes Relative: 8.9 % (ref 3.0–12.0)
Neutro Abs: 3.4 10*3/uL (ref 1.4–7.7)
Neutrophils Relative %: 52.4 % (ref 43.0–77.0)
Platelets: 330 10*3/uL (ref 150.0–400.0)
RBC: 4.09 Mil/uL (ref 3.87–5.11)
RDW: 14.6 % (ref 11.5–15.5)
WBC: 6.6 10*3/uL (ref 4.0–10.5)

## 2023-01-01 LAB — COMPREHENSIVE METABOLIC PANEL
ALT: 21 U/L (ref 0–35)
AST: 25 U/L (ref 0–37)
Albumin: 4 g/dL (ref 3.5–5.2)
Alkaline Phosphatase: 64 U/L (ref 39–117)
BUN: 9 mg/dL (ref 6–23)
CO2: 24 mEq/L (ref 19–32)
Calcium: 8.8 mg/dL (ref 8.4–10.5)
Chloride: 106 mEq/L (ref 96–112)
Creatinine, Ser: 1.32 mg/dL — ABNORMAL HIGH (ref 0.40–1.20)
GFR: 44.04 mL/min — ABNORMAL LOW (ref >60.00)
Glucose, Bld: 225 mg/dL — ABNORMAL HIGH (ref 70–99)
Potassium: 3.9 mEq/L (ref 3.5–5.1)
Sodium: 139 mEq/L (ref 135–145)
Total Bilirubin: 0.4 mg/dL (ref 0.2–1.2)
Total Protein: 7.3 g/dL (ref 6.0–8.3)

## 2023-01-01 LAB — TSH: TSH: 2.29 u[IU]/mL (ref 0.35–5.50)

## 2023-01-01 LAB — FERRITIN: Ferritin: 23.5 ng/mL (ref 10.0–291.0)

## 2023-01-01 NOTE — Patient Instructions (Addendum)
Labs today Walking oxygen test today  For constipation start miralax 1/2-1 capful daily, hold for diarrhea.  Return in 3 months for follow up visit

## 2023-01-01 NOTE — Progress Notes (Signed)
Ph: [phone] Fax: [phone]   Patient ID: Kathleen Good, female    DOB: May 26, 1961, 62 y.o.   MRN: [id number]  This visit was conducted in person.  BP 122/80   Pulse 80   Temp (!) 97.5 F (36.4 C) (Temporal)   Ht 5' 4.25" (1.632 m)   Wt 292 lb (132.5 kg)   LMP 04/25/2014 (Approximate)   SpO2 95%   BMI 49.73 kg/m    CC: hosp f/u visit  Subjective:   HPI: Kathleen Good is a 62 y.o. female presenting on 01/01/2023 for Hospitalization Follow-up (Admitted on 12/20/22 at [facility], dx other acute PE w/o acute cor pulmonale; acute saddle PE. Pt accompanied by husband, Robert. ) and Labs Only   Recent hospitalization for acute unprovoked saddle pulmonary embolism s/p thrombectomy ([facility]), discharged on eliquis.  Hospital records reviewed. Med rec performed.  CTA chest showed evidence of both acute and chronic embolus.   OSA - unable to afford CPAP.  DM - she continues amaryl 4mg  daily with breakfast, ozempic 1mg  weekly. Sugars are overall stable - running 120-130s fasting.  Lab Results  Component Value Date   HGBA1C 7.3 (H) 11/14/2022   Notes constipation since on ozempic.  Now living with mother in [place].  She notes O2 sats drop to 87%. Notes she stays shortwinded especially with activity.  Notes chronic fatigue for months.   Saw hematology Dr Cathie Hoops last week, note reviewed: rec eliquis 5mg  BID x 6 months followed by low dose eliquis for prevention indefinitely, undergoing hypercoagulable workup as per below:  Cardiolipin antibody, beta glycoprotein antibodies, prothrombin gene mutation, factor V Leiden mutation, Antithrombin III.   Home health not set up.  Other follow up appointments scheduled: none ______________________________________________________________________ Hospital admission: 12/20/2022 Hospital discharge: 12/23/2022 TCM f/u phone call:  performed on 12/25/2022  Recommendations for Outpatient Follow-up:  Follow-up with PCP in 1 week, check CBC and BMP after 1  week Follow-up with vascular surgery if needed Follow-up with hematologist for hypercoagulable workup and duration of treatment. Follow up LABS/TEST:  CBC and BMP after 1 week  Discharge Diagnoses:  Principal Problem:   Pulmonary emboli (HCC) Active Problems:   MDD (major depressive disorder), recurrent episode, moderate (HCC)   GERD   Type 2 diabetes mellitus with unspecified complications (HCC)   Dyslipidemia associated with type 2 diabetes mellitus (HCC)   HYPERTENSION, BENIGN   Persistent headaches   Fibromyalgia   OSA (obstructive sleep apnea)     Relevant past medical, surgical, family and social history reviewed and updated as indicated. Interim medical history since our last visit reviewed. Allergies and medications reviewed and updated. Outpatient Medications Prior to Visit  Medication Sig Dispense Refill   albuterol (PROVENTIL) (2.5 MG/3ML) 0.083% nebulizer solution Take 3 mLs (2.5 mg total) by nebulization every 6 (six) hours as needed for wheezing or shortness of breath. 150 mL 1   APIXABAN (ELIQUIS) VTE STARTER PACK (10MG  AND 5MG ) Take as directed on package: start with two-5mg  tablets twice daily for 7 days. On day 8, switch to one-5mg  tablet twice daily. 74 each 0   aspirin EC 81 MG tablet Take 81 mg by mouth daily with supper.     baclofen (LIORESAL) 10 MG tablet TAKE ONE TABLET BY MOUTH THREE TIMES A DAY 30 tablet 01   buPROPion (WELLBUTRIN SR) 150 MG 12 hr tablet Take 1 tablet (150 mg total) by mouth 2 (two) times daily. 180 tablet 4   busPIRone (BUSPAR) 7.5 MG tablet Take  1 tablet (7.5 mg total) by mouth at bedtime. 90 tablet 4   Cholecalciferol (VITAMIN D) 50 MCG (2000 UT) CAPS Take 1 capsule (2,000 Units total) by mouth daily. 30 capsule    citalopram (CELEXA) 10 MG tablet Take 1 tablet (10 mg total) by mouth daily. 90 tablet 4   Coenzyme Q10 ([employer] COQ-10) 200 MG capsule Take 1 capsule (200 mg total) by mouth daily.     cyanocobalamin (VITAMIN B12) 1000 MCG/ML  injection INJECT 1 ML INTO MUSCLE ONCE MONTHLY 1 mL 12   diltiazem (TIAZAC) 180 MG 24 hr capsule Take 1 capsule (180 mg total) by mouth daily. 90 capsule 4   diphenhydramine-acetaminophen (TYLENOL PM) 25-500 MG TABS tablet Take 2 tablets by mouth at bedtime as needed (pain).     glimepiride (AMARYL) 4 MG tablet Take 1 tablet (4 mg total) by mouth daily with breakfast. 90 tablet 4   hydrochlorothiazide (HYDRODIURIL) 25 MG tablet TAKE 1 TABLET BY MOUTH DAILY FOR LEG SWELLING 90 tablet 4   hydrOXYzine (ATARAX) 25 MG tablet Take 1 tablet (25 mg total) by mouth 3 (three) times daily as needed. for anxiety 40 tablet 3   Magnesium 200 MG TABS Take 1 tablet (200 mg total) by mouth daily. 30 tablet    Melatonin 5 MG TABS Take 10 mg by mouth at bedtime as needed.     metoprolol succinate (TOPROL-XL) 100 MG 24 hr tablet TAKE ONE TABLET BY MOUTH EVERY MORNING AND  EVERY NIGHT AT BEDTIME. TAKE WITH OR IMMEDIATELY FOLLOWING A MEAL 180 tablet 4   mupirocin ointment (BACTROBAN) 2 % Apply 1 Application topically 2 (two) times daily. To affected are R external ear 22 g 0   omeprazole (PRILOSEC) 40 MG capsule Take 1 capsule (40 mg total) by mouth 2 (two) times daily. 180 capsule 4   ONETOUCH ULTRA test strip USE ONE STRIP TO TEST TWICE A DAY 200 strip 3   rosuvastatin (CRESTOR) 40 MG tablet TAKE 1 TABLET BY MOUTH DAILY 90 tablet 1   Semaglutide, 1 MG/DOSE, 4 MG/3ML SOPN Inject 1 mg as directed once a week. 9 mL 4   traZODone (DESYREL) 50 MG tablet TAKE ONE TABLET BY MOUTH EVERY NIGHT AT BEDTIME AS NEEDED FOR SLEEP 30 tablet 11   No facility-administered medications prior to visit.     Per HPI unless specifically indicated in ROS section below Review of Systems  Objective:  BP 122/80   Pulse 80   Temp (!) 97.5 F (36.4 C) (Temporal)   Ht 5' 4.25" (1.632 m)   Wt 292 lb (132.5 kg)   LMP 04/25/2014 (Approximate)   SpO2 95%   BMI 49.73 kg/m   Wt Readings from Last 3 Encounters:  01/01/23 292 lb (132.5 kg)   12/28/22 289 lb (131.1 kg)  12/23/22 291 lb 0.1 oz (132 kg)      Physical Exam Vitals and nursing note reviewed.  Constitutional:      Appearance: Normal appearance. She is not ill-appearing.  HENT:     Head: Normocephalic and atraumatic.     Mouth/Throat:     Mouth: Mucous membranes are moist.     Pharynx: Oropharynx is clear. No oropharyngeal exudate or posterior oropharyngeal erythema.  Eyes:     Extraocular Movements: Extraocular movements intact.     Pupils: Pupils are equal, round, and reactive to light.  Cardiovascular:     Rate and Rhythm: Normal rate and regular rhythm.     Pulses: Normal pulses.  Heart sounds: Normal heart sounds. No murmur heard. Pulmonary:     Effort: Pulmonary effort is normal. No respiratory distress.     Breath sounds: Normal breath sounds. No wheezing, rhonchi or rales.  Musculoskeletal:     Right lower leg: No edema.     Left lower leg: No edema.  Skin:    General: Skin is warm and dry.     Findings: No rash.  Neurological:     Mental Status: She is alert.  Psychiatric:        Mood and Affect: Mood normal.        Behavior: Behavior normal.       Results for orders placed or performed in visit on 12/28/22  Antithrombin III  Result Value Ref Range   AntiThromb III Func 7 (L) 75 - 120 %  Beta-2-glycoprotein i abs, IgG/M/A  Result Value Ref Range   Beta-2 Glyco I IgG <9 0 - 20 GPI IgG units   Beta-2-Glycoprotein I IgM <9 0 - 32 GPI IgM units   Beta-2-Glycoprotein I IgA <9 0 - 25 GPI IgA units  ANTIPHOSPHOLIPID SYNDROME PROF  Result Value Ref Range   Anticardiolipin IgG <9 0 - 14 GPL U/mL   Anticardiolipin IgM <9 0 - 12 MPL U/mL   PTT Lupus Anticoagulant 37.9 0.0 - 43.5 sec   DRVVT 56.0 (H) 0.0 - 47.0 sec   Lupus Anticoag Interp Comment:   dRVVT Mix  Result Value Ref Range   dRVVT Mix 45.8 (H) 0.0 - 40.4 sec  dRVVT Confirm  Result Value Ref Range   dRVVT Confirm 0.7 (L) 0.8 - 1.2 ratio    Assessment & Plan:   Problem  List Items Addressed This Visit     MDD (major depressive disorder), recurrent episode, moderate (HCC)    Chronic, deteriorated in setting of acute illness.  She continues wellbutrin and celexa.       Type 2 diabetes mellitus with unspecified complications (HCC)    Continues amaryl and ozempic.  Metformin intolerance. Last visit we stopped actos.       Financial difficulties    Has moved in with mother in [place].      Pulmonary emboli (HCC) - Primary    Acute bilateral pulm embolism s/p thrombectomy.  Has seen heme, plan is for eliquis 5mg  BID x 6 months then transition to 2.5mg  BID preventative dosing indefinitely.  Undergoing hypercoagulability workup, so far revealing ATIII deficiency.  Appreciate hematology care.  Ambulatory pulse ox today - does not qualify for home oxygen.       Fatigue    Notes ongoing difficulty with this - will check labs for reversible causes  of fatigue.  Noted recent vit B12 and vit D reassuringly normal.       Relevant Orders   Comprehensive metabolic panel   TSH   CBC with Differential/Platelet   Ferritin   IBC panel   Antithrombin III deficiency (HCC)    Noted on recent labwork - has heme f/u 03/2023.  Now on [facility].       Drug-induced constipation with proper administration    Notes worsening constipation since ozempic started. Recommend increase water and fiber in diet.  Recommend bowel regimen in form of miralax 1/2-1 capful daily, hold for loose stools.       Caregiver stress    Cares for husband with early onset dementia.  He stayed with his son while pt was in the hospital. Now living with mother in [place].  No orders of the defined types were placed in this encounter.   Orders Placed This Encounter  Procedures   Comprehensive metabolic panel   TSH   CBC with Differential/Platelet   Ferritin   IBC panel    Patient Instructions  Labs today Walking oxygen test today  For constipation start miralax 1/2-1  capful daily, hold for diarrhea.  Return in 3 months for follow up visit  Follow up plan: Return in about 3 months (around 04/03/2023) for follow up visit.  Eustaquio Boyden, MD

## 2023-01-01 NOTE — Assessment & Plan Note (Signed)
Notes ongoing difficulty with this - will check labs for reversible causes  of fatigue.  Noted recent vit B12 and vit D reassuringly normal.

## 2023-01-01 NOTE — Assessment & Plan Note (Signed)
Notes worsening constipation since ozempic started. Recommend increase water and fiber in diet.  Recommend bowel regimen in form of miralax 1/2-1 capful daily, hold for loose stools.

## 2023-01-01 NOTE — Assessment & Plan Note (Addendum)
Acute bilateral pulm embolism s/p thrombectomy.  Has seen heme, plan is for eliquis 5mg  BID x 6 months then transition to 2.5mg  BID preventative dosing indefinitely.  Undergoing hypercoagulability workup, so far revealing ATIII deficiency.  Appreciate hematology care.  Ambulatory pulse ox today - does not qualify for home oxygen.

## 2023-01-01 NOTE — Assessment & Plan Note (Addendum)
Cares for husband with early onset dementia.  He stayed with his son while pt was in the hospital. Now living with mother in [place].

## 2023-01-01 NOTE — Assessment & Plan Note (Signed)
Continues amaryl and ozempic.  Metformin intolerance. Last visit we stopped actos.

## 2023-01-01 NOTE — Assessment & Plan Note (Signed)
Chronic, deteriorated in setting of acute illness.  She continues wellbutrin and celexa.

## 2023-01-01 NOTE — Assessment & Plan Note (Signed)
Has moved in with mother in [place].

## 2023-01-01 NOTE — Assessment & Plan Note (Addendum)
Noted on recent labwork - has heme f/u 03/2023.  Now on [facility].

## 2023-01-07 LAB — PROTHROMBIN GENE MUTATION

## 2023-01-08 ENCOUNTER — Other Ambulatory Visit: Payer: Self-pay | Admitting: Family Medicine

## 2023-01-08 MED ORDER — APIXABAN 5 MG PO TABS: 5.0000 mg | ORAL_TABLET | Freq: Two times a day (BID) | ORAL | 4 refills | Status: DC

## 2023-01-10 ENCOUNTER — Encounter: Payer: Self-pay | Admitting: Family Medicine

## 2023-01-11 MED ORDER — MUPIROCIN 2 % EX OINT: 1.0000 | TOPICAL_OINTMENT | Freq: Two times a day (BID) | CUTANEOUS | 0 refills | Status: DC

## 2023-01-14 LAB — FACTOR 5 LEIDEN

## 2023-01-18 ENCOUNTER — Other Ambulatory Visit: Payer: Self-pay | Admitting: Family Medicine

## 2023-01-18 DIAGNOSIS — M797 Fibromyalgia: Secondary | ICD-10-CM

## 2023-01-20 ENCOUNTER — Other Ambulatory Visit: Payer: Self-pay | Admitting: Family Medicine

## 2023-01-20 DIAGNOSIS — I10 Essential (primary) hypertension: Secondary | ICD-10-CM

## 2023-01-20 DIAGNOSIS — F331 Major depressive disorder, recurrent, moderate: Secondary | ICD-10-CM

## 2023-01-21 NOTE — Addendum Note (Signed)
Addended by: Rickard Patience on: 01/21/2023 10:42 PM   Modules accepted: Orders

## 2023-01-22 ENCOUNTER — Telehealth: Payer: Self-pay

## 2023-01-22 NOTE — Telephone Encounter (Signed)
Too soon. Rx for metoprolol ER sent on 11/21/22, #180/4 to [facility]. And rx for trazodone sent on 11/21/22, #30/11 to [facility].  Requests denied.

## 2023-01-22 NOTE — Telephone Encounter (Signed)
Please keep her lab appt on 10/7, add MD 1-4 days after Labs.

## 2023-01-22 NOTE — Telephone Encounter (Signed)
Baclofen Last filled:  10/16/22, #30 Last OV:  01/01/23, hosp f/u Next OV:  none

## 2023-01-22 NOTE — Telephone Encounter (Signed)
-----   Message from Rickard Patience sent at 01/21/2023 10:45 PM EDT ----- Please let patient know that her work up results showed low antithrombin III level, I recommend to repeat the labs a few days prior to her next MD visit.  Please keep her lab appt on 10/7, add MD 1-4 days after Labs. Thanks.   zy

## 2023-03-19 ENCOUNTER — Other Ambulatory Visit (HOSPITAL_COMMUNITY): Payer: Self-pay

## 2023-03-29 ENCOUNTER — Other Ambulatory Visit: Payer: Self-pay | Admitting: Family Medicine

## 2023-03-29 DIAGNOSIS — Z1211 Encounter for screening for malignant neoplasm of colon: Secondary | ICD-10-CM

## 2023-03-29 DIAGNOSIS — Z1212 Encounter for screening for malignant neoplasm of rectum: Secondary | ICD-10-CM

## 2023-04-01 ENCOUNTER — Inpatient Hospital Stay: Payer: 59 | Attending: Oncology

## 2023-04-01 DIAGNOSIS — Z803 Family history of malignant neoplasm of breast: Secondary | ICD-10-CM | POA: Insufficient documentation

## 2023-04-01 DIAGNOSIS — Z87891 Personal history of nicotine dependence: Secondary | ICD-10-CM | POA: Insufficient documentation

## 2023-04-01 DIAGNOSIS — Z8051 Family history of malignant neoplasm of kidney: Secondary | ICD-10-CM | POA: Insufficient documentation

## 2023-04-01 DIAGNOSIS — I700 Atherosclerosis of aorta: Secondary | ICD-10-CM | POA: Insufficient documentation

## 2023-04-01 DIAGNOSIS — Z7901 Long term (current) use of anticoagulants: Secondary | ICD-10-CM | POA: Insufficient documentation

## 2023-04-01 DIAGNOSIS — I2699 Other pulmonary embolism without acute cor pulmonale: Secondary | ICD-10-CM | POA: Diagnosis present

## 2023-04-01 DIAGNOSIS — J90 Pleural effusion, not elsewhere classified: Secondary | ICD-10-CM | POA: Insufficient documentation

## 2023-04-01 DIAGNOSIS — I2510 Atherosclerotic heart disease of native coronary artery without angina pectoris: Secondary | ICD-10-CM | POA: Diagnosis not present

## 2023-04-01 DIAGNOSIS — G4733 Obstructive sleep apnea (adult) (pediatric): Secondary | ICD-10-CM | POA: Insufficient documentation

## 2023-04-01 DIAGNOSIS — Z800 Family history of malignant neoplasm of digestive organs: Secondary | ICD-10-CM | POA: Insufficient documentation

## 2023-04-01 LAB — CBC WITH DIFFERENTIAL (CANCER CENTER ONLY)
Abs Immature Granulocytes: 0.03 10*3/uL (ref 0.00–0.07)
Basophils Absolute: 0.1 10*3/uL (ref 0.0–0.1)
Basophils Relative: 1 %
Eosinophils Absolute: 0.2 10*3/uL (ref 0.0–0.5)
Eosinophils Relative: 3 %
HCT: 39.8 % (ref 36.0–46.0)
Hemoglobin: 12.8 g/dL (ref 12.0–15.0)
Immature Granulocytes: 0 %
Lymphocytes Relative: 28 %
Lymphs Abs: 2.1 10*3/uL (ref 0.7–4.0)
MCH: 28.4 pg (ref 26.0–34.0)
MCHC: 32.2 g/dL (ref 30.0–36.0)
MCV: 88.2 fL (ref 80.0–100.0)
Monocytes Absolute: 0.8 10*3/uL (ref 0.1–1.0)
Monocytes Relative: 10 %
Neutro Abs: 4.3 10*3/uL (ref 1.7–7.7)
Neutrophils Relative %: 58 %
Platelet Count: 264 10*3/uL (ref 150–400)
RBC: 4.51 MIL/uL (ref 3.87–5.11)
RDW: 13.7 % (ref 11.5–15.5)
WBC Count: 7.5 10*3/uL (ref 4.0–10.5)
nRBC: 0 % (ref 0.0–0.2)

## 2023-04-01 LAB — CMP (CANCER CENTER ONLY)
ALT: 27 U/L (ref 0–44)
AST: 28 U/L (ref 15–41)
Albumin: 3.8 g/dL (ref 3.5–5.0)
Alkaline Phosphatase: 66 U/L (ref 38–126)
Anion gap: 8 (ref 5–15)
BUN: 11 mg/dL (ref 6–20)
CO2: 26 mmol/L (ref 22–32)
Calcium: 8.4 mg/dL — ABNORMAL LOW (ref 8.9–10.3)
Chloride: 105 mmol/L (ref 98–111)
Creatinine: 1.17 mg/dL — ABNORMAL HIGH (ref 0.44–1.00)
GFR, Estimated: 53 mL/min — ABNORMAL LOW (ref >60)
Glucose, Bld: 134 mg/dL — ABNORMAL HIGH (ref 70–99)
Potassium: 3.7 mmol/L (ref 3.5–5.1)
Sodium: 139 mmol/L (ref 135–145)
Total Bilirubin: 0.4 mg/dL (ref 0.3–1.2)
Total Protein: 7.6 g/dL (ref 6.5–8.1)

## 2023-04-01 LAB — ANTITHROMBIN III: AntiThromb III Func: 108 % (ref 75–120)

## 2023-04-05 ENCOUNTER — Inpatient Hospital Stay: Payer: 59 | Admitting: Oncology

## 2023-04-05 ENCOUNTER — Encounter: Payer: Self-pay | Admitting: Oncology

## 2023-04-05 VITALS — BP 125/88 | HR 80 | Temp 97.5°F | Resp 18 | Wt 292.6 lb

## 2023-04-05 DIAGNOSIS — I2692 Saddle embolus of pulmonary artery without acute cor pulmonale: Secondary | ICD-10-CM | POA: Diagnosis not present

## 2023-04-05 DIAGNOSIS — I2699 Other pulmonary embolism without acute cor pulmonale: Secondary | ICD-10-CM | POA: Diagnosis not present

## 2023-04-05 MED ORDER — APIXABAN 5 MG PO TABS: 5.0000 mg | ORAL_TABLET | Freq: Two times a day (BID) | ORAL | 2 refills | Status: DC

## 2023-04-05 NOTE — Assessment & Plan Note (Addendum)
Acute unprovoked bilateral pulmonary embolism status post thrombectomy Recommend patient to continue therapeutic Eliquis 10 mg twice daily for total 7 days and then switch to 5 mg twice daily. hypercoagulable workup showed negative cardiolipin antibody, negative beta glycoprotein antibodies, negative prothrombin gene mutation, negative factor V Leiden mutation 12/28/22 decreased Antithrombin III. Repeat 04/01/23 normal antithrombin level.  Discussed with patient that given the PE is unprovoked, recommend patient to continue Eliquis 5mg  BID for another 3 months to complete 6 months therapy.  Patient has chronic kidney disease, avoid contrast study.  I will repeat a D-dimer at the next visit. After that plan to decrease to 2.5mg  BID Recommend age-appropriate cancer screening.  She is overdue for colonoscopy.
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
## 2023-04-11 ENCOUNTER — Other Ambulatory Visit: Payer: Self-pay | Admitting: Family Medicine

## 2023-04-11 DIAGNOSIS — F331 Major depressive disorder, recurrent, moderate: Secondary | ICD-10-CM

## 2023-04-24 ENCOUNTER — Other Ambulatory Visit: Payer: Self-pay | Admitting: Family Medicine

## 2023-05-16 ENCOUNTER — Other Ambulatory Visit: Payer: Self-pay | Admitting: Family Medicine

## 2023-05-28 ENCOUNTER — Encounter: Payer: Self-pay | Admitting: Family Medicine

## 2023-05-28 NOTE — Telephone Encounter (Signed)
Attempted to contact pt. No answer, vm box full. Need to offer video visit with an available provider.   I will also send this info to pt via MyChart.

## 2023-05-29 ENCOUNTER — Telehealth: Payer: 59 | Admitting: Internal Medicine

## 2023-05-29 ENCOUNTER — Encounter: Payer: Self-pay | Admitting: Internal Medicine

## 2023-05-29 VITALS — Temp 99.4°F

## 2023-05-29 DIAGNOSIS — U071 COVID-19: Secondary | ICD-10-CM | POA: Diagnosis not present

## 2023-05-29 MED ORDER — NIRMATRELVIR/RITONAVIR (PAXLOVID) TABLET (RENAL DOSING): 2.0000 | ORAL_TABLET | Freq: Two times a day (BID) | ORAL | 0 refills | Status: AC
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
## 2023-05-29 NOTE — Assessment & Plan Note (Signed)
High risk with her medical conditions and even though she looks okay--has measured some low oximetries Will go ahead with paxlovid---renal dose. Hold rosuvastatin while on. Diltiazem every other day and cut the eliquis in half. Stop this if sig GI side effects Continue tylenol for headache Stays at home--so already isolated Discussed calling 911 if sig SOB

## 2023-06-12 ENCOUNTER — Other Ambulatory Visit: Payer: Self-pay | Admitting: Family Medicine

## 2023-06-12 DIAGNOSIS — E785 Hyperlipidemia, unspecified: Secondary | ICD-10-CM

## 2023-06-17 ENCOUNTER — Ambulatory Visit: Payer: Self-pay | Admitting: Family Medicine

## 2023-06-17 NOTE — Telephone Encounter (Signed)
Noted. Pleasant patient, difficult situation with husband's dementia. Appreciate Dr Para March seeing her tomorrow.
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
## 2023-06-18 ENCOUNTER — Ambulatory Visit: Payer: 59 | Admitting: Family Medicine

## 2023-06-18 ENCOUNTER — Encounter: Payer: Self-pay | Admitting: Family Medicine

## 2023-06-18 ENCOUNTER — Telehealth: Payer: Self-pay | Admitting: *Deleted

## 2023-06-18 VITALS — BP 124/78 | HR 80 | Temp 98.6°F | Ht 64.25 in | Wt 293.8 lb

## 2023-06-18 DIAGNOSIS — Z659 Problem related to unspecified psychosocial circumstances: Secondary | ICD-10-CM

## 2023-06-18 DIAGNOSIS — R319 Hematuria, unspecified: Secondary | ICD-10-CM

## 2023-06-18 DIAGNOSIS — F331 Major depressive disorder, recurrent, moderate: Secondary | ICD-10-CM

## 2023-06-18 DIAGNOSIS — Z636 Dependent relative needing care at home: Secondary | ICD-10-CM

## 2023-06-18 LAB — URINALYSIS, ROUTINE W REFLEX MICROSCOPIC
Bilirubin Urine: NEGATIVE
Ketones, ur: NEGATIVE
Leukocytes,Ua: NEGATIVE
Nitrite: NEGATIVE
RBC / HPF: NONE SEEN (ref 0–?)
Specific Gravity, Urine: 1.025 (ref 1.000–1.030)
Total Protein, Urine: 30 — AB
Urine Glucose: NEGATIVE
Urobilinogen, UA: 0.2 (ref 0.0–1.0)
pH: 6.5 (ref 5.0–8.0)

## 2023-06-18 MED ORDER — BUSPIRONE HCL 7.5 MG PO TABS
7.5000 mg | ORAL_TABLET | Freq: Two times a day (BID) | ORAL | 1 refills | Status: DC
Start: 2023-06-18 — End: 2023-06-24
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
## 2023-06-18 NOTE — Progress Notes (Signed)
Complex Care Management Note  Care Guide Note 06/18/2023 Name: Kathleen Good MRN: [id number] DOB: 1960-10-15  Kathleen Good is a 62 y.o. year old female who sees Eustaquio Boyden, MD for primary care. I reached out to Gaye Pollack by phone today to offer complex care management services.  Kathleen Good was given information about Complex Care Management services today including:   The Complex Care Management services include support from the care team which includes your Nurse Coordinator, Clinical Social Worker, or Pharmacist.  The Complex Care Management team is here to help remove barriers to the health concerns and goals most important to you. Complex Care Management services are voluntary, and the patient may decline or stop services at any time by request to their care team member.   Complex Care Management Consent Status: Patient agreed to services and verbal consent obtained.   Follow up plan:  Telephone appointment with complex care management team member scheduled for:  06/20/2023  Encounter Outcome:  Patient Scheduled  Burman Nieves, [facility] Care Coordination Care Guide Direct Dial: [phone]

## 2023-06-18 NOTE — Patient Instructions (Addendum)
Take buspar twice a day for 1 week then 3 times per day if needed after that. Take care.  Glad to see you.

## 2023-06-18 NOTE — Telephone Encounter (Signed)
Will see at OV.  Thanks.  

## 2023-06-20 ENCOUNTER — Ambulatory Visit: Payer: Self-pay | Admitting: *Deleted

## 2023-06-20 NOTE — Patient Outreach (Signed)
  Care Coordination   Initial Visit Note   06/20/2023 Name: Kathleen Good MRN: [id number] DOB: [birth year]  Kathleen Good is a 62 y.o. year old female who sees Eustaquio Boyden, MD for primary care. I spoke with  Gaye Pollack by phone today.  What matters to the patients health and wellness today?  Patient's spouse currently hospitalized and awaiting placement in a memory care unit. Patient's income will be affected if spouse is placed. Patient resides with her mother and has applied for disability X2 but has been denied. Per patient, she does not have enough work credits to qualify.     Goals Addressed             This Visit's Progress    community resources       Activities and task to complete in order to accomplish goals.   EMOTIONAL / MENTAL HEALTH SUPPORT Self Support options  (please consider applying for food stamps and Medicaid in the event that your financial situation changes, please also consider out patient counseling -resources emailed)         SDOH assessments and interventions completed:  Yes  SDOH Interventions Today    Flowsheet Row Most Recent Value  SDOH Interventions   Food Insecurity Interventions Intervention Not Indicated  Housing Interventions Intervention Not Indicated  Transportation Interventions Intervention Not Indicated  Utilities Interventions Intervention Not Indicated  Depression Interventions/Treatment  Medication, Counseling  [medications adjusted, resources for counselin provided]        Care Coordination Interventions:  Yes, provided  Interventions Today    Flowsheet Row Most Recent Value  Chronic Disease   Chronic disease during today's visit Other  [depression,caregiver stress,  financial difficuties]  General Interventions   General Interventions Discussed/Reviewed General Interventions Discussed, [employer], Annual Foot Exam  [confirmed that patient's spouse has been hospitalized with plan to transfer to a memory  care facility which will affect her income once placed-resources discussed for financial assistance.ie medicaid, applying for food stamps]  Mental Health Interventions   Mental Health Discussed/Reviewed Mental Health Discussed, Coping Strategies, Depression, Grief and Loss  [grief response normalized related to spouse and his medical condition, emotional support provided, MH counseling encouraged resources provided]        Follow up plan: Follow up call scheduled for 07/08/24    Encounter Outcome:  Patient Visit Completed

## 2023-06-20 NOTE — Patient Instructions (Signed)
Visit Information  Thank you for taking time to visit with me today. Please don't hesitate to contact me if I can be of assistance to you.   Following are the goals we discussed today:   Goals Addressed             This Visit's Progress    community resources       Activities and task to complete in order to accomplish goals.   EMOTIONAL / MENTAL HEALTH SUPPORT Self Support options  (please consider applying for food stamps and Medicaid in the event that your financial situation changes, please also consider out patient counseling -resources emailed)        Our next appointment is by telephone on 07/08/24 at 9am  Please call the care guide team at [phone] if you need to cancel or reschedule your appointment.   If you are experiencing a Mental Health or Behavioral Health Crisis or need someone to talk to, please call 911   Patient verbalizes understanding of instructions and care plan provided today and agrees to view in MyChart. Active MyChart status and patient understanding of how to access instructions and care plan via MyChart confirmed with patient.     Telephone follow up appointment with care management team member scheduled for:  07/08/24  Verna Czech, LCSW [place]  [facility], [facility] Licensed Clinical Social Worker Care Coordinator  Direct Dial: [phone]

## 2023-06-21 ENCOUNTER — Encounter: Payer: Self-pay | Admitting: Family Medicine

## 2023-06-21 DIAGNOSIS — R319 Hematuria, unspecified: Secondary | ICD-10-CM | POA: Insufficient documentation

## 2023-06-21 NOTE — Assessment & Plan Note (Signed)
Discussed options. Would try taking buspar twice a day for 1 week then 3 times per day if needed after that.  Reasonable to increase the dose gradually to see if that will help with her mood.  Support offered.  Still okay for outpatient follow-up.  Social work referral placed given the above.  She consented for that.

## 2023-06-21 NOTE — Assessment & Plan Note (Signed)
  Patient improved from covid earlier this month.  She had transient hematuria with paxlovid use that resolved after med cessation. No blood in urine in the meantime.  See notes on urinalysis

## 2023-06-24 ENCOUNTER — Encounter: Payer: Self-pay | Admitting: Family Medicine

## 2023-06-24 ENCOUNTER — Ambulatory Visit (INDEPENDENT_AMBULATORY_CARE_PROVIDER_SITE_OTHER): Payer: 59 | Admitting: Family Medicine

## 2023-06-24 VITALS — BP 110/70 | HR 78 | Temp 97.8°F | Ht 64.25 in | Wt 291.1 lb

## 2023-06-24 DIAGNOSIS — F331 Major depressive disorder, recurrent, moderate: Secondary | ICD-10-CM | POA: Diagnosis not present

## 2023-06-24 DIAGNOSIS — R319 Hematuria, unspecified: Secondary | ICD-10-CM | POA: Diagnosis not present

## 2023-06-24 DIAGNOSIS — Z636 Dependent relative needing care at home: Secondary | ICD-10-CM | POA: Diagnosis not present

## 2023-06-24 MED ORDER — BUSPIRONE HCL 15 MG PO TABS: 15.0000 mg | ORAL_TABLET | Freq: Every day | ORAL | 1 refills | Status: DC

## 2023-06-24 MED ORDER — ESCITALOPRAM OXALATE 10 MG PO TABS: 10.0000 mg | ORAL_TABLET | Freq: Every day | ORAL | 3 refills | Status: DC

## 2023-06-24 NOTE — Patient Instructions (Addendum)
Schedule lab visit in 3-4 weeks for repeat urinalysis.  Continue buspar 7.5mg  twice daily.  Change celexa (citalopram) to lexapro (escitalopram) 10mg  daily. New medicine sent to pharmacy.  Stop trazodone.  Good to see you today Return in 2 months for follow up visit

## 2023-06-24 NOTE — Assessment & Plan Note (Addendum)
Chronic, continued struggle as per HPI. Support provided. Recommend change celexa 10mg  to lexapro 10mg .  Continue wellbutrin SR 150mg  BID Change buspar to 15mg  nightly Stop trazodone as ineffective.  RTC 2 mo f/u visit.

## 2023-06-24 NOTE — Assessment & Plan Note (Addendum)
Reviewed stressors with patient, support provided.  Pending social worker consult.  May get medicaid once husband is placed at facility - currently doesn't meet income criteria.

## 2023-06-24 NOTE — Assessment & Plan Note (Signed)
Isolated gross hematuria while on paxlovid that has since resolved. I did ask her to return in 3-4 wks for rpt UA.

## 2023-06-24 NOTE — Progress Notes (Addendum)
Ph: [phone] Fax: [phone]   Patient ID: Kathleen Good, female    DOB: 07-May-1961, 62 y.o.   MRN: [id number]  This visit was conducted in person.  BP 110/70   Pulse 78   Temp 97.8 F (36.6 C) (Oral)   Ht 5' 4.25" (1.632 m)   Wt 291 lb 2 oz (132.1 kg)   LMP 04/25/2014 (Approximate)   SpO2 94%   BMI 49.58 kg/m    CC: mood f/u visit  Subjective:   HPI: Kathleen Good is a 62 y.o. female presenting on 06/24/2023 for Medical Management of Chronic Issues (Here for mood and insomnia f/u.)   See prior notes for details.  COVID earlier in the month - symptoms resolved. She had transient hematuria with Paxlovid use that did improve.   High stress - cares for husband with progressive FTD - he was recently hospitalized [facility], pending placement. Also cares for mother with recent COVID pneumonia. Financial difficulty.   Seen here last week with worsening anxiety - buspar 7.5mg  increased to BID with option to increase to TID. Referred to Child psychotherapist. She also continues wellbutrin SR 150mg  BID and celexa 10mg  daily as well as trazodone 50mg  nightly.   Acute unprovoked bilateral PE s/p thrombectomy 11/2022 - continues eliquis 5mg  BID x6 months then plan to drop to 2.5mg  BID indefinitely. Saw hematology Dr Cathie Hoops, hypercoagulation workup was overall reassuring.      Relevant past medical, surgical, family and social history reviewed and updated as indicated. Interim medical history since our last visit reviewed. Allergies and medications reviewed and updated. Outpatient Medications Prior to Visit  Medication Sig Dispense Refill   albuterol (PROVENTIL) (2.5 MG/3ML) 0.083% nebulizer solution Take 3 mLs (2.5 mg total) by nebulization every 6 (six) hours as needed for wheezing or shortness of breath. 150 mL 1   apixaban (ELIQUIS) 5 MG TABS tablet Take 1 tablet (5 mg total) by mouth 2 (two) times daily. 60 tablet 2   aspirin EC 81 MG tablet Take 81 mg by mouth daily with  supper.     baclofen (LIORESAL) 10 MG tablet TAKE ONE TABLET BY MOUTH THREE TIMES A DAY 30 tablet 01   buPROPion (WELLBUTRIN SR) 150 MG 12 hr tablet Take 1 tablet (150 mg total) by mouth 2 (two) times daily. 180 tablet 4   Cholecalciferol (VITAMIN D) 50 MCG (2000 UT) CAPS Take 1 capsule (2,000 Units total) by mouth daily. 30 capsule    Coenzyme Q10 ([employer] COQ-10) 200 MG capsule Take 1 capsule (200 mg total) by mouth daily.     cyanocobalamin (VITAMIN B12) 1000 MCG/ML injection INJECT 1 ML INTO MUSCLE ONCE MONTHLY 1 mL 12   diltiazem (TIAZAC) 180 MG 24 hr capsule Take 1 capsule (180 mg total) by mouth daily. 90 capsule 4   diphenhydramine-acetaminophen (TYLENOL PM) 25-500 MG TABS tablet Take 2 tablets by mouth at bedtime as needed (pain).     glimepiride (AMARYL) 4 MG tablet Take 1 tablet (4 mg total) by mouth daily with breakfast. 90 tablet 4   hydrochlorothiazide (HYDRODIURIL) 25 MG tablet TAKE 1 TABLET BY MOUTH DAILY FOR LEG SWELLING 90 tablet 4   hydrOXYzine (ATARAX) 25 MG tablet TAKE 1 TABLET BY MOUTH 3 TIMES A DAY AS NEEDED FOR ANXIETY 40 tablet 3   Magnesium 200 MG TABS Take 1 tablet (200 mg total) by mouth daily. 30 tablet    Melatonin 5 MG TABS Take 10 mg by mouth at bedtime  as needed.     metoprolol succinate (TOPROL-XL) 100 MG 24 hr tablet TAKE ONE TABLET BY MOUTH EVERY MORNING AND  EVERY NIGHT AT BEDTIME. TAKE WITH OR IMMEDIATELY FOLLOWING A MEAL 180 tablet 4   mupirocin ointment (BACTROBAN) 2 % Apply 1 Application topically 2 (two) times daily. To affected area R external ear 22 g 0   omeprazole (PRILOSEC) 40 MG capsule Take 1 capsule (40 mg total) by mouth 2 (two) times daily. 180 capsule 4   ONETOUCH ULTRA test strip USE ONE STRIP TO TEST TWICE A DAY 200 strip 3   rosuvastatin (CRESTOR) 40 MG tablet TAKE 1 TABLET BY MOUTH DAILY 90 tablet 1   Semaglutide, 1 MG/DOSE, 4 MG/3ML SOPN Inject 1 mg as directed once a week. 9 mL 4   busPIRone (BUSPAR) 7.5 MG tablet Take 1 tablet (7.5 mg total)  by mouth 2 (two) times daily. Then increase to 3 tabs per day. 180 tablet 1   citalopram (CELEXA) 10 MG tablet Take 1 tablet (10 mg total) by mouth daily. 90 tablet 4   traZODone (DESYREL) 50 MG tablet TAKE ONE TABLET BY MOUTH EVERY NIGHT AT BEDTIME AS NEEDED FOR SLEEP 30 tablet 11   busPIRone (BUSPAR) 7.5 MG tablet Take 1 tablet (7.5 mg total) by mouth 2 (two) times daily.     No facility-administered medications prior to visit.     Per HPI unless specifically indicated in ROS section below Review of Systems  Objective:  BP 110/70   Pulse 78   Temp 97.8 F (36.6 C) (Oral)   Ht 5' 4.25" (1.632 m)   Wt 291 lb 2 oz (132.1 kg)   LMP 04/25/2014 (Approximate)   SpO2 94%   BMI 49.58 kg/m   Wt Readings from Last 3 Encounters:  06/24/23 291 lb 2 oz (132.1 kg)  06/18/23 293 lb 12.8 oz (133.3 kg)  04/05/23 292 lb 9.6 oz (132.7 kg)      Physical Exam Vitals and nursing note reviewed.  Constitutional:      Appearance: Normal appearance. She is not ill-appearing.  HENT:     Mouth/Throat:     Mouth: Mucous membranes are moist.     Pharynx: Oropharynx is clear. No oropharyngeal exudate or posterior oropharyngeal erythema.     Comments: PNdrainage present Eyes:     Extraocular Movements: Extraocular movements intact.     Pupils: Pupils are equal, round, and reactive to light.  Neck:     Thyroid: No thyroid mass or thyromegaly.  Cardiovascular:     Rate and Rhythm: Normal rate and regular rhythm.     Pulses: Normal pulses.     Heart sounds: Normal heart sounds. No murmur heard. Pulmonary:     Effort: Pulmonary effort is normal. No respiratory distress.     Breath sounds: Normal breath sounds. No wheezing, rhonchi or rales.  Musculoskeletal:     Right lower leg: No edema.     Left lower leg: No edema.  Skin:    General: Skin is warm and dry.     Findings: No rash.  Neurological:     Mental Status: She is alert.  Psychiatric:        Mood and Affect: Mood normal.         Behavior: Behavior normal.       Results for orders placed or performed in visit on 06/18/23  Urinalysis, Routine w reflex microscopic   Collection Time: 06/18/23 11:48 AM  Result Value Ref Range  Color, Urine YELLOW Yellow;Lt. Yellow;Straw;Dark Yellow;Amber;Green;Red;Brown   APPearance Cloudy (A) Clear;Turbid;Slightly Cloudy;Cloudy   Specific Gravity, Urine 1.025 1.000 - 1.030   pH 6.5 5.0 - 8.0   Total Protein, Urine 30 (A) Negative   Urine Glucose NEGATIVE Negative   Ketones, ur NEGATIVE Negative   Bilirubin Urine NEGATIVE Negative   Hgb urine dipstick TRACE-LYSED (A) Negative   Urobilinogen, UA 0.2 0.0 - 1.0   Leukocytes,Ua NEGATIVE Negative   Nitrite NEGATIVE Negative   WBC, UA 0-2/hpf 0-2/hpf   RBC / HPF none seen 0-2/hpf   Mucus, UA Presence of (A) None   Squamous Epithelial / HPF Rare(0-4/hpf) Rare(0-4/hpf)   Ca Oxalate Crys, UA Presence of (A) None   Amorphous Present (A) None;Present      06/24/2023    8:22 AM 06/20/2023    2:47 PM 06/18/2023   11:15 AM 01/01/2023   12:35 PM 12/28/2022   10:00 AM  Depression screen PHQ 2/9  Decreased Interest 3 3 3 1 1   Down, Depressed, Hopeless 3 3 3 2 2   PHQ - 2 Score 6 6 6 3 3   Altered sleeping 3 3 3 3    Tired, decreased energy 3 3 3  0   Change in appetite 1 1 1 1    Feeling bad or failure about yourself  2 2 2 1    Trouble concentrating 3 1 1  0   Moving slowly or fidgety/restless 0 0  0   Suicidal thoughts 0 0 0 0   PHQ-9 Score 18 16 16 8    Difficult doing work/chores Extremely dIfficult  Extremely dIfficult Somewhat difficult        06/24/2023    8:23 AM 06/18/2023   11:15 AM 01/01/2023   12:36 PM 12/19/2022   11:46 AM  GAD 7 : Generalized Anxiety Score  Nervous, Anxious, on Edge 3 3 3 3   Control/stop worrying 3 3 3 2   Worry too much - different things 3 3 3 2   Trouble relaxing 3 3 1 2   Restless 0 1 0 1  Easily annoyed or irritable 2 2 3 3   Afraid - awful might happen 3 3 1  0  Total GAD 7 Score 17 18 14 13    Anxiety Difficulty Extremely difficult Extremely difficult Somewhat difficult Somewhat difficult   Assessment & Plan:   Problem List Items Addressed This Visit     MDD (major depressive disorder), recurrent episode, moderate (HCC) - Primary   Chronic, continued struggle as per HPI. Support provided. Recommend change celexa 10mg  to lexapro 10mg .  Continue wellbutrin SR 150mg  BID Change buspar to 15mg  nightly Stop trazodone as ineffective.  RTC 2 mo f/u visit.       Relevant Medications   escitalopram (LEXAPRO) 10 MG tablet   busPIRone (BUSPAR) 15 MG tablet   Caregiver stress   Reviewed stressors with patient, support provided.  Pending social worker consult.  May get medicaid once husband is placed at facility - currently doesn't meet income criteria.       Hematuria   Isolated gross hematuria while on paxlovid that has since resolved. I did ask her to return in 3-4 wks for rpt UA.      Relevant Orders   Urinalysis, Routine w reflex microscopic     Meds ordered this encounter  Medications   escitalopram (LEXAPRO) 10 MG tablet    Sig: Take 1 tablet (10 mg total) by mouth daily.    Dispense:  90 tablet    Refill:  3  To replace celexa   busPIRone (BUSPAR) 15 MG tablet    Sig: Take 1 tablet (15 mg total) by mouth at bedtime.    Dispense:  90 tablet    Refill:  1    Orders Placed This Encounter  Procedures   Urinalysis, Routine w reflex microscopic    Standing Status:   Future    Expiration Date:   06/23/2024    Patient Instructions  Schedule lab visit in 3-4 weeks for repeat urinalysis.  Continue buspar 7.5mg  twice daily.  Change celexa (citalopram) to lexapro (escitalopram) 10mg  daily. New medicine sent to pharmacy.  Stop trazodone.  Good to see you today Return in 2 months for follow up visit   Follow up plan: Return in about 2 months (around 08/23/2023).  Eustaquio Boyden, MD

## 2023-07-09 ENCOUNTER — Ambulatory Visit: Payer: Self-pay | Admitting: *Deleted

## 2023-07-09 ENCOUNTER — Inpatient Hospital Stay: Payer: Self-pay | Attending: Oncology

## 2023-07-09 NOTE — Patient Instructions (Signed)
 Visit Information  Thank you for taking time to visit with me today. Please don't hesitate to contact me if I can be of assistance to you.   Following are the goals we discussed today:   Goals Addressed             This Visit's Progress    community resources       Activities and task to complete in order to accomplish goals.   EMOTIONAL / MENTAL HEALTH SUPPORT Self Support options  (continue to reach out to family and friends for emotional support during this time, CSW to provide resources for grief counseling, as well available community resources to assist with financial needs..          Our next appointment is by telephone on 07/23/23 at 11am  Please call the care guide team at [phone] if you need to cancel or reschedule your appointment.   If you are experiencing a Mental Health or Behavioral Health Crisis or need someone to talk to, please call the Suicide and Crisis Lifeline: 988   Patient verbalizes understanding of instructions and care plan provided today and agrees to view in MyChart. Active MyChart status and patient understanding of how to access instructions and care plan via MyChart confirmed with patient.     Telephone follow up appointment with care management team member scheduled for:  Erandy Mceachern, LCSW [place]  [facility], [facility] Licensed Clinical Social Worker Care Coordinator  Direct Dial: [phone]

## 2023-07-09 NOTE — Patient Outreach (Signed)
  Care Coordination   Follow Up Visit Note   07/09/2023 Name: AICHA CLINGENPEEL MRN: [id number] DOB: 04-20-1961  MAELEE HOOT is a 63 y.o. year old female who sees Rilla Baller, MD for primary care. I spoke with  Donzell KATHEE Lunger by phone today.  What matters to the patients health and wellness today?  Patient's spouse has been transferred to hospice care. CSW will continue to follow for community resource needs and ongoing mental health support.    Goals Addressed             This Visit's Progress    community resources       Activities and task to complete in order to accomplish goals.   EMOTIONAL / MENTAL HEALTH SUPPORT Self Support options  (continue to reach out to family and friends for emotional support during this time, CSW to provide resources for grief counseling, as well available community resources to assist with financial needs.SABRA          SDOH assessments and interventions completed:  No     Care Coordination Interventions:  Yes, provided  Interventions Today    Flowsheet Row Most Recent Value  Chronic Disease   Chronic disease during today's visit Other  [depression, caregiver stress, financial diffiulties]  General Interventions   General Interventions Discussed/Reviewed General Interventions Reviewed, [employer]  [Conitnued assessment of community resources-confirmed  spouse has been transferred to  hospice home-will focus on emotional support at this time will continue to follow for community resource needs-pt will continue with plan to resided with her mother]  Mental Health Interventions   Mental Health Discussed/Reviewed Mental Health Reviewed  [emotional support provided related to spouse's prognosis-verbalization of feelings encouraged-CSW to continue to assess for community resource needs and ongong mental health follow up]       Follow up plan: Follow up call scheduled for 07/23/23    Encounter Outcome:  Patient Visit Completed

## 2023-07-11 ENCOUNTER — Inpatient Hospital Stay: Payer: Self-pay | Admitting: Oncology

## 2023-07-15 ENCOUNTER — Other Ambulatory Visit: Payer: 59

## 2023-07-16 ENCOUNTER — Ambulatory Visit: Payer: Self-pay | Admitting: Family Medicine

## 2023-07-16 NOTE — Telephone Encounter (Signed)
Chief Complaint: Shortness of breath Symptoms: intermittent shortness of breath and tightness in chest with history of blood clots Frequency: symptoms have been going on since Saturday Pertinent Negatives: Patient denies fever Disposition: [] ED /[] Urgent Care (no appt availability in office) / [] Appointment(In office/virtual)/ []  [facility]/ [] Home Care/ [x] Refused Recommended Disposition /[] [facility]/ []  Follow-up with PCP Additional Notes: patient who recently lost her husband c/o SOB and tightness in chest intermittently since Saturday. Patient states symptoms are similar to when she had a blood clot but patient feels like the symptoms could be stress. Per protocol, patient recommended to the ED. Patient refused ED and requested an appointment for tomorrow when there is availability. This RN states that a message will be sent to PCP and should receive a call back about appointment. Patient verbalized understanding of plan and all questions answered.    Copied from CRM [id number]. Topic: Clinical - Red Word Triage >> Jul 16, 2023 11:35 AM Corin V wrote: [facility] that prompted transfer to Nurse Triage: Patient is having shortness of breath and tightness in her chest. She says she is having symptoms of the last time she had blood clots. Her husband passed away 08-06-2023 and she is unsure if it is just that stress. Reason for Disposition  [1] MODERATE difficulty breathing (e.g., speaks in phrases, SOB even at rest, pulse 100-120) AND [2] NEW-onset or WORSE than normal  Answer Assessment - Initial Assessment Questions 1. RESPIRATORY STATUS: "Describe your breathing?" (e.g., wheezing, shortness of breath, unable to speak, severe coughing)      Shortness of breath 2. ONSET: "When did this breathing problem begin?"      Saturday 3. PATTERN "Does the difficult breathing come and go, or has it been constant since it started?"      intermittent 4. SEVERITY: "How bad is your  breathing?" (e.g., mild, moderate, severe)    - MILD: No SOB at rest, mild SOB with walking, speaks normally in sentences, can lie down, no retractions, pulse < 100.    - MODERATE: SOB at rest, SOB with minimal exertion and prefers to sit, cannot lie down flat, speaks in phrases, mild retractions, audible wheezing, pulse 100-120.    - SEVERE: Very SOB at rest, speaks in single words, struggling to breathe, sitting hunched forward, retractions, pulse > 120      Mild 5. RECURRENT SYMPTOM: "Have you had difficulty breathing before?" If Yes, ask: "When was the last time?" and "What happened that time?"      Patient had similar symptoms when she had clots but patient is thinking this could be stress due to her husband passing away last friday 6. CARDIAC HISTORY: "Do you have any history of heart disease?" (e.g., heart attack, angina, bypass surgery, angioplasty)      High blood pressure, heart palpitations 7. LUNG HISTORY: "Do you have any history of lung disease?"  (e.g., pulmonary embolus, asthma, emphysema)     Pulmonary embolus 8. CAUSE: "What do you think is causing the breathing problem?"      Possible stress 9. OTHER SYMPTOMS: "Do you have any other symptoms? (e.g., dizziness, runny nose, cough, chest pain, fever)     Tighness in chest 10. O2 SATURATION MONITOR:  "Do you use an oxygen saturation monitor (pulse oximeter) at home?" If Yes, ask: "What is your reading (oxygen level) today?" "What is your usual oxygen saturation reading?" (e.g., 95%)       98% 12. TRAVEL: "Have you traveled out of the  country in the last month?" (e.g., travel history, exposures)       No  Protocols used: Breathing Difficulty-A-AH

## 2023-07-16 NOTE — Telephone Encounter (Signed)
I spoke with pt; pt's husband funeral service as 07/15/23; pt has been under a lot of stress. On 07/13/23 pt started with intermittent SOB upon exertion and mid chest tightness and heaviness on and off. No CP. Pt has hx pulmonary embolism but pt is taking eliquis 5 mg bid and has not missed taking med.  Pt cannot find BP cuff at this time. Pt said she is not going to UC or ED and wants appt in office. Dr Reece Agar was going to work pt in today at 1 pm but pt said she could not come due to being in [place]. Dr Reece Agar opened 1 pm appt on 07/17/23 and pt to be at [facility] at 12:45. With UC & ED precautions and pt voiced understanding. Sending FYI to Dr Reece Agar.

## 2023-07-17 ENCOUNTER — Ambulatory Visit: Payer: Self-pay | Admitting: Family Medicine

## 2023-07-19 ENCOUNTER — Ambulatory Visit (INDEPENDENT_AMBULATORY_CARE_PROVIDER_SITE_OTHER)
Admission: RE | Admit: 2023-07-19 | Discharge: 2023-07-19 | Disposition: A | Discharge status: Home / Self Care | Payer: No Typology Code available for payment source | Source: Ambulatory Visit | Attending: Family Medicine | Admitting: Family Medicine

## 2023-07-19 ENCOUNTER — Encounter: Payer: Self-pay | Admitting: Family Medicine

## 2023-07-19 ENCOUNTER — Ambulatory Visit: Payer: No Typology Code available for payment source | Admitting: Family Medicine

## 2023-07-19 VITALS — BP 122/84 | HR 77 | Temp 97.8°F | Ht 64.25 in | Wt 292.0 lb

## 2023-07-19 DIAGNOSIS — I2692 Saddle embolus of pulmonary artery without acute cor pulmonale: Secondary | ICD-10-CM

## 2023-07-19 DIAGNOSIS — Z87891 Personal history of nicotine dependence: Secondary | ICD-10-CM

## 2023-07-19 DIAGNOSIS — R0789 Other chest pain: Secondary | ICD-10-CM | POA: Diagnosis not present

## 2023-07-19 DIAGNOSIS — E785 Hyperlipidemia, unspecified: Secondary | ICD-10-CM

## 2023-07-19 DIAGNOSIS — I10 Essential (primary) hypertension: Secondary | ICD-10-CM

## 2023-07-19 DIAGNOSIS — E118 Type 2 diabetes mellitus with unspecified complications: Secondary | ICD-10-CM

## 2023-07-19 DIAGNOSIS — G4733 Obstructive sleep apnea (adult) (pediatric): Secondary | ICD-10-CM

## 2023-07-19 DIAGNOSIS — Z599 Problem related to housing and economic circumstances, unspecified: Secondary | ICD-10-CM

## 2023-07-19 DIAGNOSIS — E1169 Type 2 diabetes mellitus with other specified complication: Secondary | ICD-10-CM | POA: Diagnosis not present

## 2023-07-19 DIAGNOSIS — F4321 Adjustment disorder with depressed mood: Secondary | ICD-10-CM

## 2023-07-19 DIAGNOSIS — Z6841 Body Mass Index (BMI) 40.0 and over, adult: Secondary | ICD-10-CM

## 2023-07-19 DIAGNOSIS — I2510 Atherosclerotic heart disease of native coronary artery without angina pectoris: Secondary | ICD-10-CM

## 2023-07-19 LAB — TROPONIN I: Troponin I: <3 ng/L (ref <=47)

## 2023-07-19 LAB — D-DIMER, QUANTITATIVE: D-Dimer, Quant: 0.37 ug{FEU}/mL (ref <0.50)

## 2023-07-19 MED ORDER — LORAZEPAM 0.5 MG PO TABS: 0.5000 mg | ORAL_TABLET | Freq: Two times a day (BID) | ORAL | 0 refills | Status: DC | PRN

## 2023-07-19 NOTE — Assessment & Plan Note (Signed)
Continues to remain off cigarettes, last smoked 2022.

## 2023-07-19 NOTE — Assessment & Plan Note (Addendum)
Chest heaviness with associated exertional dyspnea in setting of high recent stress, known prior PE however continues full dose Eliquis 5mg  BID.  Check EKG - no acute changes. Check CXR and STAT labs including D dimer and Troponin.  If abnormal, discussed ER evaluation.  Not reproducible pointing against musculoskeletal condition.  Denies significant GERD symptoms - and she continues omeprazole 40mg  BID.  She states hydroxyzine was not helpful - could try short term benzo for possible anxiety contribution after husband's recent death. She continues buspar, wellbutrin, lexapro.  Risk factors include obesity, hypertension, hyperlipidemia and type 2 diabetes.   ADDENDUM ==> Troponin I and D-dimer returned reassuringly normal. See mychart message - will trial lorazepam 0.5mg  tabs BID PRN anxiety and if no improvement, will recommend cardiology evaluation.

## 2023-07-19 NOTE — Progress Notes (Signed)
Ph: [phone] Fax: [phone]   Patient ID: Kathleen Good, female    DOB: 1961-01-25, 63 y.o.   MRN: [id number]  This visit was conducted in person.  BP 122/84   Pulse 77   Temp 97.8 F (36.6 C) (Oral)   Ht 5' 4.25" (1.632 m)   Wt 292 lb (132.5 kg)   LMP 04/25/2014 (Approximate)   SpO2 96%   BMI 49.73 kg/m    CC: dyspnea, chest tightness Subjective:   HPI: Kathleen Good is a 63 y.o. female presenting on 07/19/2023 for Shortness of Breath (C/o chest tightness and dyspnea. Sxs started 07/13/23. Pt accompanied by mom, Darel Hong.  )   Intermittent exertional dyspnea and mid chest "heaviness" tightness  without pain that started 6 days ago. Ongoing symptoms since husband's funeral service 07/15/2023. Significant stress due to this. No fevers/chills, cough or wheeze, unilateral leg swelling. She hasn't tried anything for these symptoms, notes tylenol and hydroxyzine didn't seem to help. Had GERD once this week.   She's been more immobile recently over the past month - sitting with her husband in hospice.  No recent long car rides, plane rides.   She is currently living in [place] with her mother.   H/o acute unprovoked bilateral PE s/p thrombectomy 11/2022 - started on eliquis 5mg  BID plan was x6 months then plan to drop to 2.5mg  BID indefinitely. She continues Eliquis 5mg  bid dose. Saw hematology Dr Cathie Hoops, hypercoagulation workup was overall reassuring. Missed hematology appt last week due to husband's passing.      Relevant past medical, surgical, family and social history reviewed and updated as indicated. Interim medical history since our last visit reviewed. Allergies and medications reviewed and updated. Outpatient Medications Prior to Visit  Medication Sig Dispense Refill   albuterol (PROVENTIL) (2.5 MG/3ML) 0.083% nebulizer solution Take 3 mLs (2.5 mg total) by nebulization every 6 (six) hours as needed for wheezing or shortness of breath. 150 mL 1   apixaban (ELIQUIS) 5 MG  TABS tablet Take 1 tablet (5 mg total) by mouth 2 (two) times daily. 60 tablet 2   aspirin EC 81 MG tablet Take 81 mg by mouth daily with supper.     baclofen (LIORESAL) 10 MG tablet TAKE ONE TABLET BY MOUTH THREE TIMES A DAY 30 tablet 01   buPROPion (WELLBUTRIN SR) 150 MG 12 hr tablet Take 1 tablet (150 mg total) by mouth 2 (two) times daily. 180 tablet 4   busPIRone (BUSPAR) 15 MG tablet Take 1 tablet (15 mg total) by mouth at bedtime. 90 tablet 1   Cholecalciferol (VITAMIN D) 50 MCG (2000 UT) CAPS Take 1 capsule (2,000 Units total) by mouth daily. 30 capsule    Coenzyme Q10 ([employer] COQ-10) 200 MG capsule Take 1 capsule (200 mg total) by mouth daily.     cyanocobalamin (VITAMIN B12) 1000 MCG/ML injection INJECT 1 ML INTO MUSCLE ONCE MONTHLY 1 mL 12   diltiazem (TIAZAC) 180 MG 24 hr capsule Take 1 capsule (180 mg total) by mouth daily. 90 capsule 4   diphenhydramine-acetaminophen (TYLENOL PM) 25-500 MG TABS tablet Take 2 tablets by mouth at bedtime as needed (pain).     escitalopram (LEXAPRO) 10 MG tablet Take 1 tablet (10 mg total) by mouth daily. 90 tablet 3   glimepiride (AMARYL) 4 MG tablet Take 1 tablet (4 mg total) by mouth daily with breakfast. 90 tablet 4   hydrochlorothiazide (HYDRODIURIL) 25 MG tablet TAKE 1 TABLET BY MOUTH DAILY FOR LEG SWELLING  90 tablet 4   hydrOXYzine (ATARAX) 25 MG tablet TAKE 1 TABLET BY MOUTH 3 TIMES A DAY AS NEEDED FOR ANXIETY 40 tablet 3   Magnesium 200 MG TABS Take 1 tablet (200 mg total) by mouth daily. 30 tablet    Melatonin 5 MG TABS Take 10 mg by mouth at bedtime as needed.     metoprolol succinate (TOPROL-XL) 100 MG 24 hr tablet TAKE ONE TABLET BY MOUTH EVERY MORNING AND  EVERY NIGHT AT BEDTIME. TAKE WITH OR IMMEDIATELY FOLLOWING A MEAL 180 tablet 4   mupirocin ointment (BACTROBAN) 2 % Apply 1 Application topically 2 (two) times daily. To affected area R external ear 22 g 0   omeprazole (PRILOSEC) 40 MG capsule Take 1 capsule (40 mg total) by mouth 2 (two)  times daily. 180 capsule 4   ONETOUCH ULTRA test strip USE ONE STRIP TO TEST TWICE A DAY 200 strip 3   rosuvastatin (CRESTOR) 40 MG tablet TAKE 1 TABLET BY MOUTH DAILY 90 tablet 1   Semaglutide, 1 MG/DOSE, 4 MG/3ML SOPN Inject 1 mg as directed once a week. 9 mL 4   No facility-administered medications prior to visit.     Per HPI unless specifically indicated in ROS section below Review of Systems  Objective:  BP 122/84   Pulse 77   Temp 97.8 F (36.6 C) (Oral)   Ht 5' 4.25" (1.632 m)   Wt 292 lb (132.5 kg)   LMP 04/25/2014 (Approximate)   SpO2 96%   BMI 49.73 kg/m   Wt Readings from Last 3 Encounters:  07/19/23 292 lb (132.5 kg)  06/24/23 291 lb 2 oz (132.1 kg)  06/18/23 293 lb 12.8 oz (133.3 kg)      Physical Exam Vitals and nursing note reviewed.  Constitutional:      Appearance: Normal appearance. She is obese. She is not ill-appearing.  HENT:     Mouth/Throat:     Mouth: Mucous membranes are moist.     Pharynx: Oropharynx is clear. No oropharyngeal exudate or posterior oropharyngeal erythema.  Eyes:     Extraocular Movements: Extraocular movements intact.     Pupils: Pupils are equal, round, and reactive to light.  Neck:     Thyroid: No thyroid mass or thyromegaly.  Cardiovascular:     Rate and Rhythm: Normal rate and regular rhythm.     Pulses: Normal pulses.     Heart sounds: Normal heart sounds. No murmur heard. Pulmonary:     Effort: Pulmonary effort is normal. No respiratory distress.     Breath sounds: Normal breath sounds. No wheezing, rhonchi or rales.     Comments: No reproducible sternal or costochondral chest wall pain to palpation  Chest:     Chest wall: No tenderness.  Musculoskeletal:     Cervical back: Normal range of motion and neck supple.     Right lower leg: No edema.     Left lower leg: No edema.  Skin:    General: Skin is warm and dry.     Findings: No rash.  Neurological:     Mental Status: She is alert.  Psychiatric:        Mood  and Affect: Mood normal.        Behavior: Behavior normal.       Results for orders placed or performed in visit on 07/19/23  D-dimer, quantitative   Collection Time: 07/19/23  2:32 PM  Result Value Ref Range   D-Dimer, Quant 0.37 <0.50 mcg/mL FEU  Troponin I   Collection Time: 07/19/23  2:40 PM  Result Value Ref Range   Troponin I <3 < OR = 47 ng/L   Lab Results  Component Value Date   HGBA1C 7.3 (H) 11/14/2022   EKG - NSR rate 76, LAD, intervals, no hypertrophy or acute ST/T changes. Unchanged from prior.   Assessment & Plan:   Problem List Items Addressed This Visit     Ex-smoker   Continues to remain off cigarettes, last smoked 2022.      Type 2 diabetes mellitus with unspecified complications (HCC)   Dyslipidemia associated with type 2 diabetes mellitus (HCC)   HYPERTENSION, BENIGN   BP well controlled on Toprol XL, hydrochlorothiazide.       Obesity, Class III, BMI 40-49.9 (morbid obesity) (HCC)   OSA (obstructive sleep apnea)   H/o untreated OSA      Financial difficulties   Grieving   Expressed my condolences for husband's recent passing      Chest heaviness - Primary   Chest heaviness with associated exertional dyspnea in setting of high recent stress, known prior PE however continues full dose Eliquis 5mg  BID.  Check EKG - no acute changes. Check CXR and STAT labs including D dimer and Troponin.  If abnormal, discussed ER evaluation.  Not reproducible pointing against musculoskeletal condition.  Denies significant GERD symptoms - and she continues omeprazole 40mg  BID.  She states hydroxyzine was not helpful - could try short term benzo for possible anxiety contribution after husband's recent death. She continues buspar, wellbutrin, lexapro.  Risk factors include obesity, hypertension, hyperlipidemia and type 2 diabetes.   ADDENDUM ==> Troponin I and D-dimer returned reassuringly normal. See mychart message - will trial lorazepam 0.5mg  tabs BID PRN  anxiety and if no improvement, will recommend cardiology evaluation.       Relevant Orders   EKG 12-Lead (Completed)   D-dimer, quantitative (Completed)   DG Chest 2 View   Troponin I (Completed)   CBC with Differential/Platelet   Comprehensive metabolic panel     Meds ordered this encounter  Medications   LORazepam (ATIVAN) 0.5 MG tablet    Sig: Take 1 tablet (0.5 mg total) by mouth 2 (two) times daily as needed for anxiety.    Dispense:  20 tablet    Refill:  0    Orders Placed This Encounter  Procedures   DG Chest 2 View    Standing Status:   Future    Number of Occurrences:   1    Expiration Date:   07/18/2024    Reason for Exam (SYMPTOM  OR DIAGNOSIS REQUIRED):   chest heaviness x 1 week    Is patient pregnant?:   No    Preferred imaging location?:   [facility]   D-dimer, quantitative   Troponin I   CBC with Differential/Platelet   Comprehensive metabolic panel   EKG 12-Lead    Patient Instructions  Chest xray today EKG today  Labs today  If worsening symptoms, seek ER care.   Follow up plan: No follow-ups on file.  Eustaquio Boyden, MD

## 2023-07-19 NOTE — Assessment & Plan Note (Signed)
Expressed my condolences for husband's recent passing

## 2023-07-19 NOTE — Assessment & Plan Note (Addendum)
BP well controlled on Toprol XL, hydrochlorothiazide.

## 2023-07-19 NOTE — Patient Instructions (Addendum)
Chest xray today EKG today  Labs today  If worsening symptoms, seek ER care.

## 2023-07-19 NOTE — Assessment & Plan Note (Signed)
H/o untreated OSA

## 2023-07-20 LAB — CBC WITH DIFFERENTIAL/PLATELET
Absolute Lymphocytes: 2371 {cells}/uL (ref 850–3900)
Absolute Monocytes: 689 {cells}/uL (ref 200–950)
Basophils Absolute: 50 {cells}/uL (ref 0–200)
Basophils Relative: 0.7 %
Eosinophils Absolute: 220 {cells}/uL (ref 15–500)
Eosinophils Relative: 3.1 %
HCT: 39.1 % (ref 35.0–45.0)
Hemoglobin: 12.6 g/dL (ref 11.7–15.5)
MCH: 27.9 pg (ref 27.0–33.0)
MCHC: 32.2 g/dL (ref 32.0–36.0)
MCV: 86.5 fL (ref 80.0–100.0)
MPV: 11.1 fL (ref 7.5–12.5)
Monocytes Relative: 9.7 %
Neutro Abs: 3770 {cells}/uL (ref 1500–7800)
Neutrophils Relative %: 53.1 %
Platelets: 264 10*3/uL (ref 140–400)
RBC: 4.52 10*6/uL (ref 3.80–5.10)
RDW: 13.5 % (ref 11.0–15.0)
Total Lymphocyte: 33.4 %
WBC: 7.1 10*3/uL (ref 3.8–10.8)

## 2023-07-20 LAB — COMPREHENSIVE METABOLIC PANEL
AG Ratio: 1.2 (calc) (ref 1.0–2.5)
ALT: 21 U/L (ref 6–29)
AST: 20 U/L (ref 10–35)
Albumin: 3.9 g/dL (ref 3.6–5.1)
Alkaline phosphatase (APISO): 81 U/L (ref 37–153)
BUN/Creatinine Ratio: 8 (calc) (ref 6–22)
BUN: 11 mg/dL (ref 7–25)
CO2: 27 mmol/L (ref 20–32)
Calcium: 8.6 mg/dL (ref 8.6–10.4)
Chloride: 105 mmol/L (ref 98–110)
Creat: 1.37 mg/dL — ABNORMAL HIGH (ref 0.50–1.05)
Globulin: 3.2 g/dL (ref 1.9–3.7)
Glucose, Bld: 191 mg/dL — ABNORMAL HIGH (ref 65–99)
Potassium: 3.8 mmol/L (ref 3.5–5.3)
Sodium: 139 mmol/L (ref 135–146)
Total Bilirubin: 0.3 mg/dL (ref 0.2–1.2)
Total Protein: 7.1 g/dL (ref 6.1–8.1)

## 2023-07-23 ENCOUNTER — Ambulatory Visit: Payer: Self-pay | Admitting: *Deleted

## 2023-07-23 ENCOUNTER — Encounter: Payer: Self-pay | Admitting: Family Medicine

## 2023-07-23 NOTE — Patient Outreach (Signed)
  Care Coordination   07/23/2023 Name: ASAKO SALIBA MRN: [id number] DOB: 1960-07-22   Care Coordination Outreach Attempts:  An unsuccessful telephone outreach was attempted today to offer the patient information about available complex care management services.  Follow Up Plan:  Additional outreach attempts will be made to offer the patient complex care management information and services.   Encounter Outcome:  No Answer   Care Coordination Interventions:  No, not indicated    Masiah Woody, LCSW [facility], [facility] Licensed Clinical Social Worker Care Coordinator  Direct Dial: [phone]

## 2023-07-30 ENCOUNTER — Other Ambulatory Visit (HOSPITAL_COMMUNITY): Payer: Self-pay

## 2023-07-30 ENCOUNTER — Telehealth: Payer: Self-pay

## 2023-07-30 NOTE — Telephone Encounter (Signed)
PA request has been Submitted. New Encounter created for follow up. For additional info see Pharmacy Prior Auth telephone encounter from 02/04.

## 2023-07-30 NOTE — Telephone Encounter (Signed)
*  Primary  Pharmacy Patient Advocate Encounter   Received notification from Patient Advice Request messages that prior authorization for Ozempic  (1 MG/DOSE) 4MG /3ML pen-injectors  is required/requested.   Insurance verification completed.   The patient is insured through [facility] .   Per test claim: PA required; PA submitted to above mentioned insurance via CoverMyMeds Key/confirmation #/EOC [id number] Status is pending

## 2023-08-02 ENCOUNTER — Other Ambulatory Visit (HOSPITAL_COMMUNITY): Payer: Self-pay

## 2023-08-02 ENCOUNTER — Other Ambulatory Visit: Payer: Self-pay | Admitting: Family Medicine

## 2023-08-02 DIAGNOSIS — M797 Fibromyalgia: Secondary | ICD-10-CM

## 2023-08-02 NOTE — Telephone Encounter (Signed)
 Baclofen  Last filled:  06/22/23, #30 Last OV:  07/19/23, SOB Next OV:  08/23/23, 2 mo f/u

## 2023-08-02 NOTE — Telephone Encounter (Signed)
 Pharmacy Patient Advocate Encounter  Received notification from  [employer]  that Prior Authorization for Ozempic  (1 MG/DOSE) 4MG /3ML pen-injectors has been APPROVED from 07/30/23 to 07/29/24. Ran test claim, Copay is $24.99. This test claim was processed through [facility]- copay amounts may vary at other pharmacies due to pharmacy/plan contracts, or as the patient moves through the different stages of their insurance plan.   PA #/Case ID/Reference #: [id number]

## 2023-08-23 ENCOUNTER — Ambulatory Visit: Payer: 59 | Admitting: Family Medicine

## 2023-08-26 ENCOUNTER — Encounter: Payer: Self-pay | Admitting: Family Medicine

## 2023-08-26 ENCOUNTER — Ambulatory Visit: Payer: No Typology Code available for payment source | Admitting: Family Medicine

## 2023-08-26 VITALS — BP 126/80 | HR 82 | Temp 98.5°F | Ht 64.25 in | Wt 295.5 lb

## 2023-08-26 DIAGNOSIS — E118 Type 2 diabetes mellitus with unspecified complications: Secondary | ICD-10-CM | POA: Diagnosis not present

## 2023-08-26 DIAGNOSIS — I2782 Chronic pulmonary embolism: Secondary | ICD-10-CM

## 2023-08-26 DIAGNOSIS — I2692 Saddle embolus of pulmonary artery without acute cor pulmonale: Secondary | ICD-10-CM

## 2023-08-26 DIAGNOSIS — R0789 Other chest pain: Secondary | ICD-10-CM

## 2023-08-26 DIAGNOSIS — R0609 Other forms of dyspnea: Secondary | ICD-10-CM | POA: Diagnosis not present

## 2023-08-26 DIAGNOSIS — F331 Major depressive disorder, recurrent, moderate: Secondary | ICD-10-CM

## 2023-08-26 LAB — POCT GLYCOSYLATED HEMOGLOBIN (HGB A1C): Hemoglobin A1C: 7.2 % — AB (ref 4.0–5.6)

## 2023-08-26 NOTE — Assessment & Plan Note (Addendum)
 This is resolved - anticipate component of stress/anxiety surrounding ill husband's death.  Given comorbidities still do recommend re-establishing with cardiology - encouraged she call for appt. Referral placed last visit.

## 2023-08-26 NOTE — Patient Instructions (Addendum)
 Call Dr Cathie Hoops and Dr Mariah Milling to schedule follow up appointments.  Continue current medicines.  Good to see you today. Return in 3 months for physical

## 2023-08-26 NOTE — Assessment & Plan Note (Signed)
 Notes intermittent trouble with this.  Continues full dose eliquis.  Pending cards f/u.

## 2023-08-26 NOTE — Assessment & Plan Note (Addendum)
 Continue ozempic 1mg  weekly with amaryl 4mg  daily.  Update A1c .  Encouraged she call and schedule diabetic eye exam which should be covered medically by her insurance

## 2023-08-26 NOTE — Assessment & Plan Note (Signed)
 Rec reschedule hematology f/u for guidance on Eliquis dosing

## 2023-08-26 NOTE — Assessment & Plan Note (Addendum)
 Chronic, overall stable period on current regimen of lexapro, buspar, wellbutrin SR - continue this. Lexapro started 05/2023

## 2023-08-26 NOTE — Progress Notes (Signed)
 Ph: [phone] Fax: [phone]   Patient ID: Kathleen Good, female    DOB: August 31, 1960, 63 y.o.   MRN: [id number]  This visit was conducted in person.  BP 126/80   Pulse 82   Temp 98.5 F (36.9 C) (Oral)   Ht 5' 4.25" (1.632 m)   Wt 295 lb 8 oz (134 kg)   LMP 04/25/2014 (Approximate)   SpO2 98%   BMI 50.33 kg/m    CC: 2 mo f/u visit  Subjective:   HPI: Kathleen Good is a 63 y.o. female presenting on 08/26/2023 for Medical Management of Chronic Issues (Here for 2 mo mood f/u.)   See prior note for details.  She is currently staying with friend in [place].   Seen here 07/19/2023 with intermittent exertional dyspnea and chest "heaviness" that started after husband's funeral service 07/15/2023. At that time, referred to cardiology - trouble getting through to schedule appt, left message and hasn't heard back. Also for presumed component of anxiety, lorazepam started 0.5mg  BID PRN - this has helped - she still has a few from #20 Rx 06/2023. She also continues her regular lexapro 10mg  daily, buspar 15mg  nightly, wellbutrin SR 150mg  BID.   Workup included reassuring EKG, CXR, D dimer, Troponin I.   She notes ongoing dyspnea with exertion but chest tightness has resolved.  She previously saw Dr Mariah Milling.  Denies cough, dizziness, or leg swelling.  Chronic headaches are unchanged.   H/o acute unprovoked bilateral PE s/p thrombectomy 11/2022 - started on eliquis 5mg  BID plan was x6 months then plan to drop to 2.5mg  BID indefinitely. She continues Eliquis 5mg  bid dose. Saw hematology Dr Cathie Hoops, hypercoagulation workup was overall reassuring. Missed hematology appt 06/2023 due to husband's passing.   DM - sugars running overall ok - 160s postprandial, 90-110s fasting. Occ hypoglycemic symptoms. Occ paresthesias to hands and feet, occ blurry vision. Last eye exam - DUE.  Lab Results  Component Value Date   HGBA1C 7.2 (A) 08/26/2023    Diabetic Foot Exam - Simple   Simple Foot  Form Diabetic Foot exam was performed with the following findings: Yes 08/26/2023 12:47 PM  Visual Inspection No deformities, no ulcerations, no other skin breakdown bilaterally: Yes Sensation Testing Intact to touch and monofilament testing bilaterally: Yes Pulse Check Posterior Tibialis and Dorsalis pulse intact bilaterally: Yes Comments No claudication         Relevant past medical, surgical, family and social history reviewed and updated as indicated. Interim medical history since our last visit reviewed. Allergies and medications reviewed and updated. Outpatient Medications Prior to Visit  Medication Sig Dispense Refill   albuterol (PROVENTIL) (2.5 MG/3ML) 0.083% nebulizer solution Take 3 mLs (2.5 mg total) by nebulization every 6 (six) hours as needed for wheezing or shortness of breath. 150 mL 1   apixaban (ELIQUIS) 5 MG TABS tablet Take 1 tablet (5 mg total) by mouth 2 (two) times daily. 60 tablet 2   aspirin EC 81 MG tablet Take 81 mg by mouth daily with supper.     baclofen (LIORESAL) 10 MG tablet TAKE ONE TABLET BY MOUTH THREE TIMES A DAY 30 tablet 01   buPROPion (WELLBUTRIN SR) 150 MG 12 hr tablet Take 1 tablet (150 mg total) by mouth 2 (two) times daily. 180 tablet 4   busPIRone (BUSPAR) 15 MG tablet Take 1 tablet (15 mg total) by mouth at bedtime. 90 tablet 1   Cholecalciferol (VITAMIN D) 50 MCG (2000 UT) CAPS Take 1 capsule (  2,000 Units total) by mouth daily. 30 capsule    Coenzyme Q10 ([employer]S COQ-10) 200 MG capsule Take 1 capsule (200 mg total) by mouth daily.     cyanocobalamin (VITAMIN B12) 1000 MCG/ML injection INJECT 1 ML INTO MUSCLE ONCE MONTHLY 1 mL 12   diltiazem (TIAZAC) 180 MG 24 hr capsule Take 1 capsule (180 mg total) by mouth daily. 90 capsule 4   diphenhydramine-acetaminophen (TYLENOL PM) 25-500 MG TABS tablet Take 2 tablets by mouth at bedtime as needed (pain).     escitalopram (LEXAPRO) 10 MG tablet Take 1 tablet (10 mg total) by mouth daily. 90 tablet 3    glimepiride (AMARYL) 4 MG tablet Take 1 tablet (4 mg total) by mouth daily with breakfast. 90 tablet 4   hydrochlorothiazide (HYDRODIURIL) 25 MG tablet TAKE 1 TABLET BY MOUTH DAILY FOR LEG SWELLING 90 tablet 4   hydrOXYzine (ATARAX) 25 MG tablet TAKE 1 TABLET BY MOUTH 3 TIMES A DAY AS NEEDED FOR ANXIETY 40 tablet 3   LORazepam (ATIVAN) 0.5 MG tablet Take 1 tablet (0.5 mg total) by mouth 2 (two) times daily as needed for anxiety. 20 tablet 0   Magnesium 200 MG TABS Take 1 tablet (200 mg total) by mouth daily. 30 tablet    Melatonin 5 MG TABS Take 10 mg by mouth at bedtime as needed.     metoprolol succinate (TOPROL-XL) 100 MG 24 hr tablet TAKE ONE TABLET BY MOUTH EVERY MORNING AND  EVERY NIGHT AT BEDTIME. TAKE WITH OR IMMEDIATELY FOLLOWING A MEAL 180 tablet 4   mupirocin ointment (BACTROBAN) 2 % Apply 1 Application topically 2 (two) times daily. To affected area R external ear 22 g 0   omeprazole (PRILOSEC) 40 MG capsule Take 1 capsule (40 mg total) by mouth 2 (two) times daily. 180 capsule 4   ONETOUCH ULTRA test strip USE ONE STRIP TO TEST TWICE A DAY 200 strip 3   rosuvastatin (CRESTOR) 40 MG tablet TAKE 1 TABLET BY MOUTH DAILY 90 tablet 1   Semaglutide, 1 MG/DOSE, 4 MG/3ML SOPN Inject 1 mg as directed once a week. 9 mL 4   No facility-administered medications prior to visit.     Per HPI unless specifically indicated in ROS section below Review of Systems  Objective:  BP 126/80   Pulse 82   Temp 98.5 F (36.9 C) (Oral)   Ht 5' 4.25" (1.632 m)   Wt 295 lb 8 oz (134 kg)   LMP 04/25/2014 (Approximate)   SpO2 98%   BMI 50.33 kg/m   Wt Readings from Last 3 Encounters:  08/26/23 295 lb 8 oz (134 kg)  07/19/23 292 lb (132.5 kg)  06/24/23 291 lb 2 oz (132.1 kg)      Physical Exam Vitals and nursing note reviewed.  Constitutional:      Appearance: Normal appearance. She is not ill-appearing.  HENT:     Head: Normocephalic and atraumatic.     Mouth/Throat:     Mouth: Mucous  membranes are moist.     Pharynx: Oropharynx is clear. No oropharyngeal exudate or posterior oropharyngeal erythema.  Eyes:     Extraocular Movements: Extraocular movements intact.     Conjunctiva/sclera: Conjunctivae normal.     Pupils: Pupils are equal, round, and reactive to light.  Cardiovascular:     Rate and Rhythm: Normal rate and regular rhythm.     Pulses: Normal pulses.     Heart sounds: Normal heart sounds. No murmur heard. Pulmonary:  Effort: Pulmonary effort is normal. No respiratory distress.     Breath sounds: Normal breath sounds. No wheezing, rhonchi or rales.  Musculoskeletal:     Right lower leg: No edema.     Left lower leg: No edema.     Comments: See HPI for foot exam if done  Skin:    General: Skin is warm and dry.     Findings: No rash.  Neurological:     Mental Status: She is alert.  Psychiatric:        Mood and Affect: Mood normal.        Behavior: Behavior normal.       Results for orders placed or performed in visit on 08/26/23  POCT glycosylated hemoglobin (Hb A1C)   Collection Time: 08/26/23 12:50 PM  Result Value Ref Range   Hemoglobin A1C 7.2 (A) 4.0 - 5.6 %   HbA1c POC (<> result, manual entry)     HbA1c, POC (prediabetic range)     HbA1c, POC (controlled diabetic range)         08/26/2023   12:49 PM 06/24/2023    8:22 AM 06/20/2023    2:47 PM 06/18/2023   11:15 AM 01/01/2023   12:35 PM  Depression screen PHQ 2/9  Decreased Interest 2 3 3 3 1   Down, Depressed, Hopeless 1 3 3 3 2   PHQ - 2 Score 3 6 6 6 3   Altered sleeping 3 3 3 3 3   Tired, decreased energy 3 3 3 3  0  Change in appetite 1 1 1 1 1   Feeling bad or failure about yourself  1 2 2 2 1   Trouble concentrating 1 3 1 1  0  Moving slowly or fidgety/restless 0 0 0  0  Suicidal thoughts 0 0 0 0 0  PHQ-9 Score 12 18 16 16 8   Difficult doing work/chores Very difficult Extremely dIfficult  Extremely dIfficult Somewhat difficult       08/26/2023   12:49 PM 06/24/2023    8:23 AM  06/18/2023   11:15 AM 01/01/2023   12:36 PM  GAD 7 : Generalized Anxiety Score  Nervous, Anxious, on Edge 2 3 3 3   Control/stop worrying 3 3 3 3   Worry too much - different things 3 3 3 3   Trouble relaxing 2 3 3 1   Restless 1 0 1 0  Easily annoyed or irritable 2 2 2 3   Afraid - awful might happen 1 3 3 1   Total GAD 7 Score 14 17 18 14   Anxiety Difficulty Very difficult Extremely difficult Extremely difficult Somewhat difficult   Assessment & Plan:   Problem List Items Addressed This Visit     Pulmonary emboli (HCC) (Chronic)   Rec reschedule hematology f/u for guidance on Eliquis dosing      MDD (major depressive disorder), recurrent episode, moderate (HCC)   Chronic, overall stable period on current regimen of lexapro, buspar, wellbutrin SR - continue this. Lexapro started 05/2023      Type 2 diabetes mellitus with unspecified complications (HCC) - Primary   Continue ozempic 1mg  weekly with amaryl 4mg  daily.  Update A1c .  Encouraged she call and schedule diabetic eye exam which should be covered medically by her insurance      Relevant Orders   POCT glycosylated hemoglobin (Hb A1C) (Completed)   Exertional dyspnea   Notes intermittent trouble with this.  Continues full dose eliquis.  Pending cards f/u.       Chest heaviness   This  is resolved - anticipate component of stress/anxiety surrounding ill husband's death.  Given comorbidities still do recommend re-establishing with cardiology - encouraged she call for appt. Referral placed last visit.         No orders of the defined types were placed in this encounter.   Orders Placed This Encounter  Procedures   POCT glycosylated hemoglobin (Hb A1C)    Patient Instructions  Call Dr Cathie Hoops and Dr Mariah Milling to schedule follow up appointments.  Continue current medicines.  Good to see you today. Return in 3 months for physical  Follow up plan: Return in about 3 months (around 11/26/2023), or if symptoms worsen or fail to  improve, for annual exam, prior fasting for blood work.  Eustaquio Boyden, MD

## 2023-09-03 ENCOUNTER — Telehealth: Payer: Self-pay | Admitting: Oncology

## 2023-09-03 NOTE — Telephone Encounter (Signed)
 Patient called to reschedule cancelled appointments from Jan. Appointments rescheduled as requested.

## 2023-09-07 ENCOUNTER — Other Ambulatory Visit: Payer: Self-pay | Admitting: Family Medicine

## 2023-09-07 DIAGNOSIS — F331 Major depressive disorder, recurrent, moderate: Secondary | ICD-10-CM

## 2023-09-09 ENCOUNTER — Encounter: Payer: Self-pay | Admitting: Family Medicine

## 2023-09-09 ENCOUNTER — Ambulatory Visit: Payer: Self-pay | Admitting: Family Medicine

## 2023-09-09 NOTE — Telephone Encounter (Signed)
 3rd attempt to reach patient, no answer, message left to return call. Routing for no contact follow up.   Message from Dodge F sent at 09/09/2023  2:55 PM EDT  Summary: Cyst on chin   Copied From CRM [id number]. Reason for Triage: Patient has cyst on chin, she didn't want to wait until Wednesday for an appointment and wants to see if something can be sent to her pharmacy. She had cyst in the past and has been prescribed antibiotics for this before.

## 2023-09-09 NOTE — Telephone Encounter (Signed)
 Summary: Cyst on chin   Copied From CRM [id number]. Reason for Triage: Patient has cyst on chin, she didn't want to wait until Wednesday for an appointment and wants to see if something can be sent to her pharmacy. She had cyst in the past and has been prescribed antibiotics for this before.        Left message for patient to call back.

## 2023-09-09 NOTE — Telephone Encounter (Signed)
 Left message for patient to call back, 2nd attempt.

## 2023-09-10 NOTE — Telephone Encounter (Signed)
 Hydroxyzine Last filled:  08/02/23, #40 Last OV:  08/26/23, 2 mo mood f/u Next OV:  11/27/23, CPE

## 2023-09-10 NOTE — Telephone Encounter (Signed)
 Copied from CRM [id number]. Topic: General - Other >> Sep 09, 2023  5:24 PM Sonny Dandy B wrote: Reason for CRM: pt returned a call from [place]. Pt is requesting a call back

## 2023-09-10 NOTE — Telephone Encounter (Signed)
 Please schedule OV for evaluation with next available provider.

## 2023-09-10 NOTE — Telephone Encounter (Signed)
 MyChart message fwd to Dr Reece Agar (see 09/09/23, pt msg).

## 2023-09-11 ENCOUNTER — Other Ambulatory Visit: Payer: Self-pay | Admitting: Family Medicine

## 2023-09-11 DIAGNOSIS — F331 Major depressive disorder, recurrent, moderate: Secondary | ICD-10-CM

## 2023-09-11 DIAGNOSIS — I2692 Saddle embolus of pulmonary artery without acute cor pulmonale: Secondary | ICD-10-CM

## 2023-09-11 MED ORDER — HYDROXYZINE HCL 25 MG PO TABS: 25.0000 mg | ORAL_TABLET | Freq: Two times a day (BID) | ORAL | 3 refills | Status: DC | PRN

## 2023-09-11 NOTE — Telephone Encounter (Signed)
 lvm for pt to call office to schedule appt.

## 2023-09-11 NOTE — Telephone Encounter (Signed)
 Eliquis last filled:  08/11/23, #60 Hydroxyzine last rx:  04/12/23, #40 Last OV:  08/26/23, 2 mo mood f/u Next OV:  11/27/23, CPE

## 2023-09-13 MED ORDER — DOXYCYCLINE HYCLATE 100 MG PO TABS: 100.0000 mg | ORAL_TABLET | Freq: Two times a day (BID) | ORAL | 0 refills | Status: DC

## 2023-09-22 ENCOUNTER — Encounter: Payer: Self-pay | Admitting: Cardiology

## 2023-09-22 DIAGNOSIS — R931 Abnormal findings on diagnostic imaging of heart and coronary circulation: Secondary | ICD-10-CM | POA: Insufficient documentation

## 2023-09-22 DIAGNOSIS — R072 Precordial pain: Secondary | ICD-10-CM | POA: Insufficient documentation
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
## 2023-09-23 ENCOUNTER — Inpatient Hospital Stay: Attending: Oncology

## 2023-09-23 DIAGNOSIS — Z86711 Personal history of pulmonary embolism: Secondary | ICD-10-CM | POA: Insufficient documentation

## 2023-09-23 DIAGNOSIS — Z7901 Long term (current) use of anticoagulants: Secondary | ICD-10-CM | POA: Insufficient documentation

## 2023-09-23 DIAGNOSIS — I2692 Saddle embolus of pulmonary artery without acute cor pulmonale: Secondary | ICD-10-CM

## 2023-09-23 LAB — CBC WITH DIFFERENTIAL (CANCER CENTER ONLY)
Abs Immature Granulocytes: 0.02 10*3/uL (ref 0.00–0.07)
Basophils Absolute: 0.1 10*3/uL (ref 0.0–0.1)
Basophils Relative: 1 %
Eosinophils Absolute: 0.2 10*3/uL (ref 0.0–0.5)
Eosinophils Relative: 2 %
HCT: 38.1 % (ref 36.0–46.0)
Hemoglobin: 12.1 g/dL (ref 12.0–15.0)
Immature Granulocytes: 0 %
Lymphocytes Relative: 27 %
Lymphs Abs: 2.2 10*3/uL (ref 0.7–4.0)
MCH: 27.7 pg (ref 26.0–34.0)
MCHC: 31.8 g/dL (ref 30.0–36.0)
MCV: 87.2 fL (ref 80.0–100.0)
Monocytes Absolute: 0.7 10*3/uL (ref 0.1–1.0)
Monocytes Relative: 9 %
Neutro Abs: 4.9 10*3/uL (ref 1.7–7.7)
Neutrophils Relative %: 61 %
Platelet Count: 259 10*3/uL (ref 150–400)
RBC: 4.37 MIL/uL (ref 3.87–5.11)
RDW: 14.7 % (ref 11.5–15.5)
WBC Count: 8.1 10*3/uL (ref 4.0–10.5)
nRBC: 0 % (ref 0.0–0.2)

## 2023-09-23 LAB — CMP (CANCER CENTER ONLY)
ALT: 18 U/L (ref 0–44)
AST: 23 U/L (ref 15–41)
Albumin: 3.5 g/dL (ref 3.5–5.0)
Alkaline Phosphatase: 72 U/L (ref 38–126)
Anion gap: 8 (ref 5–15)
BUN: 13 mg/dL (ref 6–20)
CO2: 23 mmol/L (ref 22–32)
Calcium: 8.7 mg/dL — ABNORMAL LOW (ref 8.9–10.3)
Chloride: 108 mmol/L (ref 98–111)
Creatinine: 1.05 mg/dL — ABNORMAL HIGH (ref 0.44–1.00)
GFR, Estimated: >60 mL/min (ref >60)
Glucose, Bld: 142 mg/dL — ABNORMAL HIGH (ref 70–99)
Potassium: 3.6 mmol/L (ref 3.5–5.1)
Sodium: 139 mmol/L (ref 135–145)
Total Bilirubin: 0.4 mg/dL (ref 0.0–1.2)
Total Protein: 7.1 g/dL (ref 6.5–8.1)

## 2023-09-23 LAB — D-DIMER, QUANTITATIVE: D-Dimer, Quant: 0.39 ug{FEU}/mL (ref 0.00–0.50)

## 2023-09-24 ENCOUNTER — Encounter: Payer: Self-pay | Admitting: Cardiology

## 2023-09-24 ENCOUNTER — Ambulatory Visit: Attending: Cardiology | Admitting: Cardiology

## 2023-09-24 VITALS — BP 122/82 | HR 76 | Ht 64.5 in | Wt 294.8 lb

## 2023-09-24 DIAGNOSIS — R931 Abnormal findings on diagnostic imaging of heart and coronary circulation: Secondary | ICD-10-CM

## 2023-09-24 DIAGNOSIS — R0600 Dyspnea, unspecified: Secondary | ICD-10-CM | POA: Diagnosis not present

## 2023-09-24 DIAGNOSIS — R072 Precordial pain: Secondary | ICD-10-CM

## 2023-09-24 NOTE — Patient Instructions (Signed)
 Medication Instructions:  No changes.  *If you need a refill on your cardiac medications before your next appointment, please call your pharmacy*  Lab Work: BNP today.  If you have labs (blood work) drawn today and your tests are completely normal, you will receive your results only by: MyChart Message (if you have MyChart) OR A paper copy in the mail If you have any lab test that is abnormal or we need to change your treatment, we will call you to review the results.  Testing/Procedures: Your physician has requested that you have an echocardiogram. Echocardiography is a painless test that uses sound waves to create images of your heart. It provides your doctor with information about the size and shape of your heart and how well your heart's chambers and valves are working. This procedure takes approximately one hour. There are no restrictions for this procedure. Please do NOT wear cologne, perfume, aftershave, or lotions (deodorant is allowed). Please arrive 15 minutes prior to your appointment time.  Please note: We ask at that you not bring children with you during ultrasound (echo/ vascular) testing. Due to room size and safety concerns, children are not allowed in the ultrasound rooms during exams. Our front office staff cannot provide observation of children in our lobby area while testing is being conducted. An adult accompanying a patient to their appointment will only be allowed in the ultrasound room at the discretion of the ultrasound technician under special circumstances. We apologize for any inconvenience.   Follow-Up: At [facility], you and your health needs are our priority.  As part of our continuing mission to provide you with exceptional heart care, our providers are all part of one team.  This team includes your primary Cardiologist (physician) and Advanced Practice Providers or APPs (Physician Assistants and Nurse Practitioners) who all work together to provide you  with the care you need, when you need it.  Your next appointment:    As needed.   Provider:   Rollene Rotunda, MD     We recommend signing up for the patient portal called "MyChart".  Sign up information is provided on this After Visit Summary.  MyChart is used to connect with patients for Virtual Visits (Telemedicine).  Patients are able to view lab/test results, encounter notes, upcoming appointments, etc.  Non-urgent messages can be sent to your provider as well.   To learn more about what you can do with MyChart, go to [email or web address].   Other Instructions       1st Floor: - Lobby - Registration  - Pharmacy  - Lab - Cafe  2nd Floor: - PV Lab - Diagnostic Testing (echo, CT, nuclear med)  3rd Floor: - Vacant  4th Floor: - TCTS (cardiothoracic surgery) - AFib Clinic - Structural Heart Clinic - Vascular Surgery  - Vascular Ultrasound  5th Floor: - HeartCare Cardiology (general and EP) - Clinical Pharmacy for coumadin, hypertension, lipid, weight-loss medications, and med management appointments    Valet parking services will be available as well.

## 2023-09-25 ENCOUNTER — Inpatient Hospital Stay: Admitting: Oncology

## 2023-09-25 LAB — BRAIN NATRIURETIC PEPTIDE: BNP: 44.7 pg/mL (ref 0.0–100.0)

## 2023-09-26 ENCOUNTER — Encounter: Payer: Self-pay | Admitting: Family Medicine

## 2023-09-26 NOTE — Telephone Encounter (Signed)
 Name of Medication:  Lorazepam Name of Pharmacy:  [facility] Last [place] or Written Date and Quantity:  07/19/23, #20 Last Office Visit and Type:  08/26/23, 2 mo mood f/u Next Office Visit and Type:  11/27/23, CPE Last Controlled Substance Agreement Date:  08/30/14 Last UDS:  08/30/14

## 2023-09-30 MED ORDER — LORAZEPAM 0.5 MG PO TABS: 0.5000 mg | ORAL_TABLET | Freq: Two times a day (BID) | ORAL | 0 refills | Status: DC | PRN

## 2023-09-30 NOTE — Telephone Encounter (Signed)
 ERx

## 2023-10-01 ENCOUNTER — Encounter: Payer: Self-pay | Admitting: Oncology

## 2023-10-01 ENCOUNTER — Inpatient Hospital Stay: Attending: Oncology | Admitting: Oncology

## 2023-10-01 VITALS — BP 126/83 | HR 73 | Temp 96.6°F | Resp 18 | Wt 290.8 lb

## 2023-10-01 DIAGNOSIS — I2782 Chronic pulmonary embolism: Secondary | ICD-10-CM | POA: Diagnosis not present

## 2023-10-01 DIAGNOSIS — Z7901 Long term (current) use of anticoagulants: Secondary | ICD-10-CM | POA: Insufficient documentation

## 2023-10-01 DIAGNOSIS — I2692 Saddle embolus of pulmonary artery without acute cor pulmonale: Secondary | ICD-10-CM

## 2023-10-01 DIAGNOSIS — Z8051 Family history of malignant neoplasm of kidney: Secondary | ICD-10-CM | POA: Insufficient documentation

## 2023-10-01 DIAGNOSIS — Z87891 Personal history of nicotine dependence: Secondary | ICD-10-CM | POA: Diagnosis not present

## 2023-10-01 DIAGNOSIS — Z86718 Personal history of other venous thrombosis and embolism: Secondary | ICD-10-CM | POA: Insufficient documentation

## 2023-10-01 DIAGNOSIS — Z803 Family history of malignant neoplasm of breast: Secondary | ICD-10-CM | POA: Diagnosis not present

## 2023-10-01 DIAGNOSIS — Z800 Family history of malignant neoplasm of digestive organs: Secondary | ICD-10-CM | POA: Insufficient documentation

## 2023-10-01 DIAGNOSIS — Z86711 Personal history of pulmonary embolism: Secondary | ICD-10-CM | POA: Diagnosis present

## 2023-10-01 MED ORDER — APIXABAN 2.5 MG PO TABS: 2.5000 mg | ORAL_TABLET | Freq: Two times a day (BID) | ORAL | 6 refills | Status: DC

## 2023-10-01 NOTE — Assessment & Plan Note (Addendum)
 unprovoked bilateral pulmonary embolism status post thrombectomy hypercoagulable workup showed negative cardiolipin antibody, negative beta glycoprotein antibodies, negative prothrombin gene mutation, negative factor V Leiden mutation 12/28/22 decreased Antithrombin III. Repeat 04/01/23 normal antithrombin level.  Labs are reviewed and discussed with patient. D-dimer wnl Recommend patient to decrease to 2.5mg  BID Recommend age-appropriate cancer screening.  She is overdue for colonoscopy.recommend patient to re-establish care with GI
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
## 2023-11-05 ENCOUNTER — Ambulatory Visit (HOSPITAL_COMMUNITY): Attending: Cardiovascular Disease

## 2023-11-05 DIAGNOSIS — R072 Precordial pain: Secondary | ICD-10-CM | POA: Diagnosis present

## 2023-11-05 DIAGNOSIS — R0600 Dyspnea, unspecified: Secondary | ICD-10-CM | POA: Insufficient documentation

## 2023-11-05 LAB — ECHOCARDIOGRAM COMPLETE
Area-P 1/2: 2.93 cm2
S' Lateral: 2.7 cm

## 2023-11-05 MED ORDER — PERFLUTREN LIPID MICROSPHERE
1.0000 mL | INTRAVENOUS | Status: AC | PRN
Start: 2023-11-05 — End: 2023-11-05
  Administered 2023-11-05: 2 mL via INTRAVENOUS

## 2023-11-07 ENCOUNTER — Ambulatory Visit: Payer: Self-pay | Admitting: Cardiology

## 2023-11-07 DIAGNOSIS — R0602 Shortness of breath: Secondary | ICD-10-CM

## 2023-11-11 ENCOUNTER — Other Ambulatory Visit: Payer: Self-pay | Admitting: Family Medicine

## 2023-11-11 DIAGNOSIS — E1169 Type 2 diabetes mellitus with other specified complication: Secondary | ICD-10-CM

## 2023-11-11 DIAGNOSIS — F419 Anxiety disorder, unspecified: Secondary | ICD-10-CM

## 2023-11-12 NOTE — Telephone Encounter (Signed)
 Name of Medication:  Lorazepam  Name of Pharmacy:  [facility] Last [place] or Written Date and Quantity:  09/30/23, #20 Last Office Visit and Type:  08/26/23, 2 mo mood f/u Next Office Visit and Type:  03/02/24, CPE Last Controlled Substance Agreement Date:  08/30/14 Last UDS:  08/30/14

## 2023-11-12 NOTE — Telephone Encounter (Signed)
 ERx

## 2023-11-16 ENCOUNTER — Other Ambulatory Visit: Payer: Self-pay | Admitting: Family Medicine

## 2023-11-16 DIAGNOSIS — E611 Iron deficiency: Secondary | ICD-10-CM | POA: Insufficient documentation

## 2023-11-16 DIAGNOSIS — E1169 Type 2 diabetes mellitus with other specified complication: Secondary | ICD-10-CM

## 2023-11-16 DIAGNOSIS — R931 Abnormal findings on diagnostic imaging of heart and coronary circulation: Secondary | ICD-10-CM

## 2023-11-16 DIAGNOSIS — E118 Type 2 diabetes mellitus with unspecified complications: Secondary | ICD-10-CM

## 2023-11-16 DIAGNOSIS — E538 Deficiency of other specified B group vitamins: Secondary | ICD-10-CM

## 2023-11-16 DIAGNOSIS — E559 Vitamin D deficiency, unspecified: Secondary | ICD-10-CM

## 2023-11-19 ENCOUNTER — Other Ambulatory Visit

## 2023-11-21 ENCOUNTER — Other Ambulatory Visit: Payer: Self-pay | Admitting: Family Medicine

## 2023-11-21 DIAGNOSIS — I10 Essential (primary) hypertension: Secondary | ICD-10-CM

## 2023-11-25 ENCOUNTER — Other Ambulatory Visit: Payer: Self-pay | Admitting: Family Medicine

## 2023-11-25 DIAGNOSIS — K219 Gastro-esophageal reflux disease without esophagitis: Secondary | ICD-10-CM

## 2023-11-25 DIAGNOSIS — E538 Deficiency of other specified B group vitamins: Secondary | ICD-10-CM

## 2023-11-25 DIAGNOSIS — E118 Type 2 diabetes mellitus with unspecified complications: Secondary | ICD-10-CM

## 2023-11-27 ENCOUNTER — Encounter: Admitting: Family Medicine

## 2023-12-17 ENCOUNTER — Other Ambulatory Visit: Payer: Self-pay | Admitting: Family Medicine

## 2023-12-17 DIAGNOSIS — E118 Type 2 diabetes mellitus with unspecified complications: Secondary | ICD-10-CM

## 2023-12-18 NOTE — Telephone Encounter (Signed)
 Ozempic  Last filled:  11/11/23, #9 mL Last OV:  08/26/23, 2 mo mood f/u Next OV:  03/02/24, CPE

## 2023-12-19 ENCOUNTER — Other Ambulatory Visit: Payer: Self-pay | Admitting: Family Medicine

## 2023-12-19 DIAGNOSIS — F331 Major depressive disorder, recurrent, moderate: Secondary | ICD-10-CM

## 2023-12-19 DIAGNOSIS — F419 Anxiety disorder, unspecified: Secondary | ICD-10-CM

## 2023-12-20 NOTE — Telephone Encounter (Signed)
 Name of Medication:  Lorazepam  Name of Pharmacy:  [facility] Last [place] or Written Date and Quantity:  11/12/23, #20 Last Office Visit and Type:  08/26/23, 2 mo mood f/u Next Office Visit and Type:  03/02/24, CPE Last Controlled Substance Agreement Date:  08/30/14 Last UDS:  08/30/14  Trazodone  last rx:  09/24/23

## 2023-12-23 NOTE — Telephone Encounter (Signed)
 ERx

## 2023-12-30 ENCOUNTER — Ambulatory Visit: Payer: Self-pay | Admitting: *Deleted

## 2023-12-30 ENCOUNTER — Ambulatory Visit (INDEPENDENT_AMBULATORY_CARE_PROVIDER_SITE_OTHER): Admitting: Family

## 2023-12-30 ENCOUNTER — Encounter: Payer: Self-pay | Admitting: Family

## 2023-12-30 VITALS — BP 130/82 | HR 82 | Temp 96.7°F | Ht 64.5 in | Wt 285.0 lb

## 2023-12-30 DIAGNOSIS — R590 Localized enlarged lymph nodes: Secondary | ICD-10-CM | POA: Insufficient documentation

## 2023-12-30 DIAGNOSIS — L0201 Cutaneous abscess of face: Secondary | ICD-10-CM | POA: Diagnosis not present

## 2023-12-30 LAB — CBC WITH DIFFERENTIAL/PLATELET
Basophils Absolute: 0.1 K/uL (ref 0.0–0.1)
Basophils Relative: 0.8 % (ref 0.0–3.0)
Eosinophils Absolute: 0.1 K/uL (ref 0.0–0.7)
Eosinophils Relative: 0.9 % (ref 0.0–5.0)
HCT: 39.2 % (ref 36.0–46.0)
Hemoglobin: 12.6 g/dL (ref 12.0–15.0)
Lymphocytes Relative: 18.3 % (ref 12.0–46.0)
Lymphs Abs: 2.1 K/uL (ref 0.7–4.0)
MCHC: 32.2 g/dL (ref 30.0–36.0)
MCV: 85.1 fl (ref 78.0–100.0)
Monocytes Absolute: 1.2 K/uL — ABNORMAL HIGH (ref 0.1–1.0)
Monocytes Relative: 10.6 % (ref 3.0–12.0)
Neutro Abs: 8 K/uL — ABNORMAL HIGH (ref 1.4–7.7)
Neutrophils Relative %: 69.4 % (ref 43.0–77.0)
Platelets: 265 K/uL (ref 150.0–400.0)
RBC: 4.61 Mil/uL (ref 3.87–5.11)
RDW: 15.7 % — ABNORMAL HIGH (ref 11.5–15.5)
WBC: 11.5 K/uL — ABNORMAL HIGH (ref 4.0–10.5)

## 2023-12-30 MED ORDER — DOXYCYCLINE HYCLATE 100 MG PO TABS: 100.0000 mg | ORAL_TABLET | Freq: Two times a day (BID) | ORAL | 0 refills | Status: DC

## 2023-12-30 MED ORDER — SULFAMETHOXAZOLE-TRIMETHOPRIM 800-160 MG PO TABS: 1.0000 | ORAL_TABLET | Freq: Two times a day (BID) | ORAL | 0 refills | Status: DC

## 2023-12-30 NOTE — Telephone Encounter (Signed)
 FYI Only or Action Required?: FYI only for provider.  Patient was last seen in primary care on 08/26/2023 by Rilla Baller, MD. Called Nurse Triage reporting Cyst. Symptoms began several days ago. Interventions attempted: Ice/heat application. Symptoms are: rapidly worsening.  Triage Disposition: See Physician Within 24 Hours  Patient/caregiver understands and will follow disposition?: Yes   Reason for Disposition  Boil > 2 inches across (> 5 cm; larger than a golf ball or ping pong ball)  Answer Assessment - Initial Assessment Questions 1. APPEARANCE of BOIL: What does the boil look like?      Swollen, red 2. LOCATION: Where is the boil located?      Swelling under chin into the neck- started on R 3. NUMBER: How many boils are there?      One area- some drainage 4. SIZE: How big is the boil? (e.g., inches, cm; compare to size of a coin or other object)     2 inches 5. ONSET: When did the boil start?     Friday night 6. PAIN: Is there any pain? If Yes, ask: How bad is the pain?   (Scale 1-10; or mild, moderate, severe)     5-6/10 7. FEVER: Do you have a fever? If Yes, ask: What is it, how was it measured, and when did it start?      Last night 101.5- ear 8. SOURCE: Have you been around anyone with boils or other Staph infections? Have you ever had boils before?     Hx of cysts 9. OTHER SYMPTOMS: Do you have any other symptoms? (e.g., shaking chills, weakness, rash elsewhere on body)     Chills lasy night, headache  Protocols used: Boil (Skin Abscess)-A-AH   Copied from CRM [id number]. Topic: Clinical - Red Word Triage >> Dec 30, 2023  8:10 AM Fonda T wrote: [facility] that prompted transfer to Nurse Triage: Patient calling states has a cyst on her chin that is extremely painful, with increased swelling, making it difficult to talk.

## 2023-12-30 NOTE — Progress Notes (Signed)
 l  Established Patient Office Visit  Subjective:   Patient ID: Kathleen Good, female    DOB: Nov 18, 1960  Age: 63 y.o. MRN: [id number]  CC:  Chief Complaint  Patient presents with   Cyst    Cyst on chin started Friday and has worsened since then. Had fever of 101.5 last night.  She had leftover doxycycline  she took 2 pills sat and 2 pills Sun. Has had issues with recurring cysts for months     HPI: Kathleen Good is a 63 y.o. female presenting on 12/30/2023 for Cyst (Cyst on chin started Friday and has worsened since then. Had fever of 101.5 last night. [name] had leftover doxycycline  she took 2 pills sat and 2 pills Sun. Has had issues with recurring cysts for months )  Three days ago started with a red pimpled on the left side of her chin, and has since progressed with swelling, tenderness, and erythema from left side of chin extending to below chin on the right side. No tooth pain, has had all of her teeth removed on the bottom row. No trouble swallowing.   She did have a fever last night around 101.5 took tylenol  and now without a fever.       ROS: Negative unless specifically indicated above in HPI.   Relevant past medical history reviewed and updated as indicated.   Allergies and medications reviewed and updated.   Current Outpatient Medications:    albuterol  (PROVENTIL ) (2.5 MG/3ML) 0.083% nebulizer solution, Take 3 mLs (2.5 mg total) by nebulization every 6 (six) hours as needed for wheezing or shortness of breath., Disp: 150 mL, Rfl: 1   apixaban  (ELIQUIS ) 2.5 MG TABS tablet, Take 1 tablet (2.5 mg total) by mouth 2 (two) times daily., Disp: 60 tablet, Rfl: 6   aspirin  EC 81 MG tablet, Take 81 mg by mouth daily with supper., Disp: , Rfl:    baclofen  (LIORESAL ) 10 MG tablet, TAKE ONE TABLET BY MOUTH THREE TIMES A DAY, Disp: 30 tablet, Rfl: 01   buPROPion  (WELLBUTRIN  SR) 150 MG 12 hr tablet, TAKE 1 TABLET BY MOUTH 2 TIMES A DAY, Disp: 180 tablet, Rfl: 0   busPIRone  (BUSPAR ) 15  MG tablet, Take 1 tablet (15 mg total) by mouth at bedtime., Disp: 90 tablet, Rfl: 1   Cholecalciferol  (VITAMIN D ) 50 MCG (2000 UT) CAPS, Take 1 capsule (2,000 Units total) by mouth daily., Disp: 30 capsule, Rfl:    cyanocobalamin  (VITAMIN B12) 1000 MCG/ML injection, INJECT 1 ML INTO THE MUSCLE ONCE MONTHLY, Disp: 1 mL, Rfl: 2   diltiazem  (TIAZAC ) 180 MG 24 hr capsule, Take 1 capsule (180 mg total) by mouth daily., Disp: 90 capsule, Rfl: 4   diphenhydramine -acetaminophen  (TYLENOL  PM) 25-500 MG TABS tablet, Take 2 tablets by mouth at bedtime as needed (pain)., Disp: , Rfl:    glimepiride  (AMARYL ) 4 MG tablet, TAKE 1 TABLET BY MOUTH DAILY WITH BREAKFAST, Disp: 90 tablet, Rfl: 0   hydrochlorothiazide  (HYDRODIURIL ) 25 MG tablet, TAKE 1 TABLET BY MOUTH DAILY FOR LEG SWELLING, Disp: 90 tablet, Rfl: 1   hydrOXYzine  (ATARAX ) 25 MG tablet, Take 1 tablet (25 mg total) by mouth 2 (two) times daily as needed for anxiety (sedation precautions)., Disp: 40 tablet, Rfl: 3   LORazepam  (ATIVAN ) 0.5 MG tablet, TAKE 1 TABLET BY MOUTH 2 TIMES A DAY AS NEEDED FOR ANXIETY, Disp: 20 tablet, Rfl: 1   Magnesium  200 MG TABS, Take 1 tablet (200 mg total) by mouth daily., Disp: 30 tablet, Rfl:  Melatonin 5 MG TABS, Take 10 mg by mouth at bedtime as needed., Disp: , Rfl:    metoprolol  succinate (TOPROL -XL) 100 MG 24 hr tablet, TAKE ONE TABLET BY MOUTH EVERY MORNING AND  EVERY NIGHT AT BEDTIME. TAKE WITH OR IMMEDIATELY FOLLOWING A MEAL, Disp: 180 tablet, Rfl: 4   omeprazole  (PRILOSEC) 40 MG capsule, TAKE 1 CAPSULE BY MOUTH 2 TIMES A DAY, Disp: 180 capsule, Rfl: 0   ONETOUCH ULTRA test strip, USE ONE STRIP TO TEST TWICE A DAY, Disp: 200 strip, Rfl: 3   rosuvastatin  (CRESTOR ) 40 MG tablet, TAKE 1 TABLET BY MOUTH DAILY, Disp: 90 tablet, Rfl: 1   Semaglutide , 1 MG/DOSE, (OZEMPIC , 1 MG/DOSE,) 4 MG/3ML SOPN, DIAL AND INJECT UNDER THE SKIN 1 MG WEEKLY, Disp: 3 mL, Rfl: 3   sulfamethoxazole -trimethoprim  (BACTRIM  DS) 800-160 MG tablet,  Take 1 tablet by mouth 2 (two) times daily for 7 days., Disp: 14 tablet, Rfl: 0   traZODone  (DESYREL ) 50 MG tablet, TAKE 1 TABLET BY MOUTH EVERY NIGHT AT BEDTIME AS NEEDED FOR SLEEP, Disp: 30 tablet, Rfl: 3  Allergies  Allergen Reactions   Penicillins Rash    PATIENT HAS HAD A PCN REACTION WITH IMMEDIATE RASH, FACIAL/TONGUE/THROAT SWELLING, SOB, OR LIGHTHEADEDNESS WITH HYPOTENSION:  #  #  #  YES  #  #  #   Has patient had a PCN reaction causing severe rash involving mucus membranes or skin necrosis: No Has patient had a PCN reaction that required hospitalization No Has patient had a PCN reaction occurring within the last 10 years: No If all of the above answers are NO, then may proceed with Cephalosporin use.   Codeine Sulfate     REACTION: causes something like heart palpitations   Cymbalta  [Duloxetine  Hcl] Other (See Comments)    pedal edema   Gabapentin Other (See Comments)    Visual hallucinations   Hydrocodone-Guaifenesin     REACTION: heart palpitations   Lyrica  [Pregabalin ] Other (See Comments)    oversedation   Metformin  And Related Nausea Only    GI upset   Paxlovid  [Nirmatrelvir -Ritonavir ]     Hematuria with use   Percocet [Oxycodone -Acetaminophen ]     Heart pounding   Phenergan [Promethazine Hcl] Itching    Objective:   BP 130/82   Pulse 82   Temp (!) 96.7 F (35.9 C) (Temporal)   Ht 5' 4.5 (1.638 m)   Wt 285 lb (129.3 kg)   LMP 04/25/2014 (Approximate)   SpO2 98%   BMI 48.16 kg/m    Physical Exam Constitutional:      General: She is not in acute distress.    Appearance: Normal appearance. She is normal weight. She is not ill-appearing, toxic-appearing or diaphoretic.  HENT:     Head: Normocephalic.  Cardiovascular:     Rate and Rhythm: Normal rate.  Pulmonary:     Effort: Pulmonary effort is normal.  Musculoskeletal:        General: Normal range of motion.  Skin:    Findings: Abscess (right lower chin with erythema with yellow head not  expressable no drainage with induration approximately 3.5 cm diameter with right submandibular lymphadenopathy) present.  Neurological:     General: No focal deficit present.     Mental Status: She is alert and oriented to person, place, and time. Mental status is at baseline.  Psychiatric:        Mood and Affect: Mood normal.        Behavior: Behavior normal.  Thought Content: Thought content normal.        Judgment: Judgment normal.      Assessment & Plan:  Abscess of chin Assessment & Plan: Rx bactrim  800/160 mg po bid x 7 days Doxycycline  has not been effective x 2 days.  Pt advised to:  Please monitor site for worsening signs/symptoms of infection to include: increasing redness, increasing tenderness, increase in size, and or pustulant drainage from site. If this is to occur please let me know immediately.   U/s head and neck as with fever and submandibular lymphadenopathy Close f/u x 4 days in office. Could consider referral to general surgery if progressive pt to let me know.  Warm compresses several times a day  Cbc today   Orders: -     Sulfamethoxazole -Trimethoprim ; Take 1 tablet by mouth 2 (two) times daily for 7 days.  Dispense: 14 tablet; Refill: 0 -     US  SOFT TISSUE HEAD & NECK (NON-THYROID ); Future -     CBC with Differential/Platelet  Submandibular lymphadenopathy -     US  SOFT TISSUE HEAD & NECK (NON-THYROID ); Future -     CBC with Differential/Platelet     Follow up plan: Return in about 4 days (around 01/03/2024) for wth pcp Dr. KANDICE if he doesn't have any openings schedule with me.  Ginger Patrick, FNP

## 2023-12-30 NOTE — Assessment & Plan Note (Addendum)
 Rx bactrim  800/160 mg po bid x 7 days Doxycycline  has not been effective x 2 days.  Pt advised to:  Please monitor site for worsening signs/symptoms of infection to include: increasing redness, increasing tenderness, increase in size, and or pustulant drainage from site. If this is to occur please let me know immediately.   U/s head and neck as with fever and submandibular lymphadenopathy Close f/u x 4 days in office. Could consider referral to general surgery if progressive pt to let me know.  Warm compresses several times a day  Cbc today

## 2023-12-30 NOTE — Patient Instructions (Signed)
 I have sent in your order electronically for the following: ultrasound head neck  at this location below. Please call to schedule the appointment at your convenience  [facility] outpatient imaging center off [address], [place] [address] Phone [phone]-  8-5 pm

## 2023-12-30 NOTE — Telephone Encounter (Signed)
 Saw patient already, thank you for speaking with the patient.  Please see note for further information.

## 2023-12-31 ENCOUNTER — Ambulatory Visit

## 2023-12-31 ENCOUNTER — Ambulatory Visit: Payer: Self-pay | Admitting: Family

## 2023-12-31 DIAGNOSIS — L0201 Cutaneous abscess of face: Secondary | ICD-10-CM

## 2024-01-01 ENCOUNTER — Telehealth: Payer: Self-pay | Admitting: Family Medicine

## 2024-01-01 MED ORDER — SULFAMETHOXAZOLE-TRIMETHOPRIM 800-160 MG PO TABS: 2.0000 | ORAL_TABLET | Freq: Every day | ORAL | 0 refills | Status: DC

## 2024-01-01 NOTE — Telephone Encounter (Signed)
 Copied from CRM [id number]. Topic: General - Phone/Fax/Address >> Jan 01, 2024  4:03 PM Gennette ORN wrote: Patient/patient representative is calling for clinic's phone, fax, or address information.Also to let us  know they do not accept patient insurance as well.

## 2024-01-01 NOTE — Telephone Encounter (Signed)
 Needs close monitoring of fever and healing stages. Will check with pt daily. If one more day with fever and only slight improvement in abscess will refer to general surgery.

## 2024-01-01 NOTE — Telephone Encounter (Signed)
 Spoke with pt to get clarification of message. States she did not call our office and as far as she knows, Dr KANDICE is in her network.

## 2024-01-03 ENCOUNTER — Other Ambulatory Visit: Payer: Self-pay | Admitting: Family Medicine

## 2024-01-03 ENCOUNTER — Ambulatory Visit: Payer: Self-pay

## 2024-01-03 DIAGNOSIS — L0201 Cutaneous abscess of face: Secondary | ICD-10-CM

## 2024-01-03 DIAGNOSIS — M797 Fibromyalgia: Secondary | ICD-10-CM

## 2024-01-03 DIAGNOSIS — I10 Essential (primary) hypertension: Secondary | ICD-10-CM

## 2024-01-03 MED ORDER — SULFAMETHOXAZOLE-TRIMETHOPRIM 800-160 MG PO TABS: 2.0000 | ORAL_TABLET | Freq: Two times a day (BID) | ORAL | 0 refills | Status: AC

## 2024-01-03 NOTE — Addendum Note (Signed)
 Addended by: RILLA BALLER on: 01/03/2024 04:43 PM   Modules accepted: Orders

## 2024-01-03 NOTE — Telephone Encounter (Signed)
 Please notify I have sent in higher dose 2 DS BID to pharmacy.

## 2024-01-03 NOTE — Telephone Encounter (Signed)
 FYI Only or Action Required?: Action required by provider: reporting incorrect dose was sent to pharmacy.  Patient was last seen in primary care on 12/30/2023 by Corwin Antu, FNP.  Called Nurse Triage reporting reporting wrong dose of medication.  Symptoms began today.  Interventions attempted: Nothing.  Symptoms are: na.  Triage Disposition: Call PCP Now  Patient/caregiver understands and will follow disposition?: No, wishes to speak with PCP  Copied from CRM [id number]. Topic: Clinical - Prescription Issue >> Jan 03, 2024  4:29 PM Avram MATSU wrote: Reason for CRM: patient stated her rx was sent in wrong it should be take 2 tablets twice a day and was sent in as 1 tablet once a day. sulfamethoxazole -trimethoprim  (BACTRIM  DS) 800-160 MG tablet [[id number]] Reason for Disposition  [1] Caller has URGENT medicine question about med that primary care doctor (or NP/PA) or specialist prescribed AND [2] triager unable to answer question  Answer Assessment - Initial Assessment Questions patient stated her rx was sent in wrong it should be take 2 tablets twice a day and was sent in as 1 tablet once a day. sulfamethoxazole -trimethoprim  (BACTRIM  DS) 800-160 MG tablet  Protocols used: Medication Question Call-A-AH

## 2024-01-06 NOTE — Telephone Encounter (Signed)
 Left message on voicemail, per dpr, notifying pt Dr KANDICE sent a higher dose Bactrim  rx to [facility]. Pt is to take 2 tabs twice a day for 7 days.

## 2024-01-06 NOTE — Telephone Encounter (Signed)
 ERx

## 2024-01-24 ENCOUNTER — Other Ambulatory Visit: Payer: Self-pay | Admitting: Family Medicine

## 2024-01-24 DIAGNOSIS — F331 Major depressive disorder, recurrent, moderate: Secondary | ICD-10-CM

## 2024-01-27 NOTE — Telephone Encounter (Signed)
 Per 06/24/23 OV notes, strength increased to 15 mg daily h.s. New rx sent 06/24/23, #90/1 refill to [facility].   Request denied.

## 2024-02-11 ENCOUNTER — Ambulatory Visit (HOSPITAL_COMMUNITY): Admission: RE | Admit: 2024-02-11 | Source: Ambulatory Visit

## 2024-02-13 ENCOUNTER — Encounter: Payer: Self-pay | Admitting: *Deleted

## 2024-02-13 ENCOUNTER — Ambulatory Visit: Payer: Self-pay | Admitting: Family

## 2024-02-13 ENCOUNTER — Ambulatory Visit (INDEPENDENT_AMBULATORY_CARE_PROVIDER_SITE_OTHER): Admitting: Family

## 2024-02-13 ENCOUNTER — Encounter: Payer: Self-pay | Admitting: Family

## 2024-02-13 ENCOUNTER — Ambulatory Visit
Admission: RE | Admit: 2024-02-13 | Discharge: 2024-02-13 | Disposition: A | Discharge status: Home / Self Care | Source: Ambulatory Visit | Attending: Family | Admitting: Family

## 2024-02-13 ENCOUNTER — Telehealth: Payer: Self-pay

## 2024-02-13 VITALS — BP 112/84 | HR 83 | Temp 98.2°F | Ht 64.5 in | Wt 280.2 lb

## 2024-02-13 DIAGNOSIS — R1084 Generalized abdominal pain: Secondary | ICD-10-CM | POA: Insufficient documentation

## 2024-02-13 DIAGNOSIS — E1169 Type 2 diabetes mellitus with other specified complication: Secondary | ICD-10-CM

## 2024-02-13 DIAGNOSIS — R1011 Right upper quadrant pain: Secondary | ICD-10-CM

## 2024-02-13 DIAGNOSIS — R3989 Other symptoms and signs involving the genitourinary system: Secondary | ICD-10-CM

## 2024-02-13 DIAGNOSIS — E118 Type 2 diabetes mellitus with unspecified complications: Secondary | ICD-10-CM

## 2024-02-13 DIAGNOSIS — R1114 Bilious vomiting: Secondary | ICD-10-CM | POA: Insufficient documentation

## 2024-02-13 DIAGNOSIS — Z7985 Long-term (current) use of injectable non-insulin antidiabetic drugs: Secondary | ICD-10-CM

## 2024-02-13 DIAGNOSIS — E559 Vitamin D deficiency, unspecified: Secondary | ICD-10-CM | POA: Diagnosis not present

## 2024-02-13 DIAGNOSIS — E611 Iron deficiency: Secondary | ICD-10-CM

## 2024-02-13 DIAGNOSIS — R3915 Urgency of urination: Secondary | ICD-10-CM | POA: Diagnosis not present

## 2024-02-13 DIAGNOSIS — K5901 Slow transit constipation: Secondary | ICD-10-CM | POA: Insufficient documentation

## 2024-02-13 DIAGNOSIS — E538 Deficiency of other specified B group vitamins: Secondary | ICD-10-CM

## 2024-02-13 LAB — CBC
HCT: 42.6 % (ref 36.0–46.0)
Hemoglobin: 14 g/dL (ref 12.0–15.0)
MCHC: 32.8 g/dL (ref 30.0–36.0)
MCV: 84.3 fl (ref 78.0–100.0)
Platelets: 247 K/uL (ref 150.0–400.0)
RBC: 5.06 Mil/uL (ref 3.87–5.11)
RDW: 15.9 % — ABNORMAL HIGH (ref 11.5–15.5)
WBC: 9.1 K/uL (ref 4.0–10.5)

## 2024-02-13 LAB — URINALYSIS, ROUTINE W REFLEX MICROSCOPIC
Leukocytes,Ua: NEGATIVE
Nitrite: NEGATIVE
Specific Gravity, Urine: 1.025 (ref 1.000–1.030)
Total Protein, Urine: >=300 — AB
Urine Glucose: NEGATIVE
Urobilinogen, UA: 1 (ref 0.0–1.0)
pH: 6 (ref 5.0–8.0)

## 2024-02-13 LAB — IBC + FERRITIN
Ferritin: 18.2 ng/mL (ref 10.0–291.0)
Iron: 102 ug/dL (ref 42–145)
Saturation Ratios: 21.7 % (ref 20.0–50.0)
TIBC: 470.4 ug/dL — ABNORMAL HIGH (ref 250.0–450.0)
Transferrin: 336 mg/dL (ref 212.0–360.0)

## 2024-02-13 LAB — COMPREHENSIVE METABOLIC PANEL WITH GFR
ALT: 19 U/L (ref 0–35)
AST: 24 U/L (ref 0–37)
Albumin: 4.3 g/dL (ref 3.5–5.2)
Alkaline Phosphatase: 97 U/L (ref 39–117)
BUN: 11 mg/dL (ref 6–23)
CO2: 23 meq/L (ref 19–32)
Calcium: 9.1 mg/dL (ref 8.4–10.5)
Chloride: 101 meq/L (ref 96–112)
Creatinine, Ser: 1.07 mg/dL (ref 0.40–1.20)
GFR: 56.22 mL/min — ABNORMAL LOW (ref >60.00)
Glucose, Bld: 152 mg/dL — ABNORMAL HIGH (ref 70–99)
Potassium: 3.6 meq/L (ref 3.5–5.1)
Sodium: 136 meq/L (ref 135–145)
Total Bilirubin: 0.6 mg/dL (ref 0.2–1.2)
Total Protein: 8 g/dL (ref 6.0–8.3)

## 2024-02-13 LAB — VITAMIN D 25 HYDROXY (VIT D DEFICIENCY, FRACTURES): VITD: 45.16 ng/mL (ref 30.00–100.00)

## 2024-02-13 LAB — LIPASE: Lipase: 51 U/L (ref 11.0–59.0)

## 2024-02-13 LAB — AMYLASE: Amylase: 46 U/L (ref 27–131)

## 2024-02-13 MED ORDER — ONDANSETRON HCL 4 MG PO TABS: 4.0000 mg | ORAL_TABLET | Freq: Three times a day (TID) | ORAL | 0 refills | Status: AC | PRN

## 2024-02-13 NOTE — Addendum Note (Signed)
 Addended by: HOPE VEVA PARAS on: 02/13/2024 01:25 PM   Modules accepted: Orders

## 2024-02-13 NOTE — Telephone Encounter (Signed)
 Anna with [facility] called report on Stat US  of abd limited RUQ; Impression Unremarkable Rt upper quadrant US . Report is I Epic. Sending note to ONEIDA Patrick FNP and [place] pool.

## 2024-02-13 NOTE — Telephone Encounter (Signed)
 Thanks rena. Have assessed.

## 2024-02-13 NOTE — Addendum Note (Signed)
 Addended by: CORWIN ANTU on: 02/13/2024 01:04 PM   Modules accepted: Orders

## 2024-02-13 NOTE — Progress Notes (Addendum)
 Established Patient Office Visit  Subjective:      CC:  Chief Complaint  Patient presents with   Acute Visit    N/V x4 days    HPI: Kathleen Good is a 63 y.o. female presenting on 02/13/2024 for Acute Visit (N/V x4 days) .  Discussed the use of AI scribe software for clinical note transcription with the patient, who gave verbal consent to proceed.  History of Present Illness Kathleen Good is a 63 year old female who presents with persistent weakness and inability to eat.  She began experiencing nausea on Sunday, which progressed to vomiting by Sunday evening. The vomiting persisted through Monday, and although she felt nauseous on Tuesday, she did not vomit that day. However, she began vomiting again last night, primarily experiencing dry heaves. She describes feeling extremely weak and lacking energy, stating 'I have no energy now.'  Her dietary intake has been minimal over the past week, consisting of a pack of crackers, a small cup of rice, and an attempt to eat two chicken nuggets, which resulted in vomiting. She has been unable to consume more substantial meals due to her symptoms.  She denies any abdominal pain.   She reports constipation, with her last bowel movement occurring earlier today, which was the first in five or six days. She describes it as a 'pretty good one.' No diarrhea, but she has been belching without significant gas.denies heartburn.   She has been attempting to stay hydrated by sipping water and watered-down Gatorade, estimating her intake at about three bottles of water per day, but less Gatorade. She mentions doing this to maintain her blood sugar levels.  No urinary symptoms such as frequency, urgency, or burning, although urgency is normal for her. She has noticed a decrease in urine production recently.         Social history:  Relevant past medical, surgical, family and social history reviewed and updated as indicated. Interim medical  history since our last visit reviewed.  Allergies and medications reviewed and updated.  DATA REVIEWED: CHART IN EPIC     ROS: Negative unless specifically indicated above in HPI.    Current Outpatient Medications:    albuterol  (PROVENTIL ) (2.5 MG/3ML) 0.083% nebulizer solution, Take 3 mLs (2.5 mg total) by nebulization every 6 (six) hours as needed for wheezing or shortness of breath., Disp: 150 mL, Rfl: 1   apixaban  (ELIQUIS ) 2.5 MG TABS tablet, Take 1 tablet (2.5 mg total) by mouth 2 (two) times daily., Disp: 60 tablet, Rfl: 6   aspirin  EC 81 MG tablet, Take 81 mg by mouth daily with supper., Disp: , Rfl:    baclofen  (LIORESAL ) 10 MG tablet, TAKE ONE TABLET BY MOUTH THREE TIMES A DAY, Disp: 30 tablet, Rfl: 01   buPROPion  (WELLBUTRIN  SR) 150 MG 12 hr tablet, TAKE 1 TABLET BY MOUTH 2 TIMES A DAY, Disp: 180 tablet, Rfl: 0   busPIRone  (BUSPAR ) 15 MG tablet, Take 1 tablet (15 mg total) by mouth at bedtime., Disp: 90 tablet, Rfl: 1   Cholecalciferol  (VITAMIN D ) 50 MCG (2000 UT) CAPS, Take 1 capsule (2,000 Units total) by mouth daily., Disp: 30 capsule, Rfl:    cyanocobalamin  (VITAMIN B12) 1000 MCG/ML injection, INJECT 1 ML INTO THE MUSCLE ONCE MONTHLY, Disp: 1 mL, Rfl: 2   diltiazem  (TIAZAC ) 180 MG 24 hr capsule, TAKE 1 CAPSULE BY MOUTH DAILY, Disp: 90 capsule, Rfl: 0   diphenhydramine -acetaminophen  (TYLENOL  PM) 25-500 MG TABS tablet, Take 2 tablets by mouth  at bedtime as needed (pain)., Disp: , Rfl:    glimepiride  (AMARYL ) 4 MG tablet, TAKE 1 TABLET BY MOUTH DAILY WITH BREAKFAST, Disp: 90 tablet, Rfl: 0   hydrochlorothiazide  (HYDRODIURIL ) 25 MG tablet, TAKE 1 TABLET BY MOUTH DAILY FOR LEG SWELLING, Disp: 90 tablet, Rfl: 1   hydrOXYzine  (ATARAX ) 25 MG tablet, Take 1 tablet (25 mg total) by mouth 2 (two) times daily as needed for anxiety (sedation precautions)., Disp: 40 tablet, Rfl: 3   LORazepam  (ATIVAN ) 0.5 MG tablet, TAKE 1 TABLET BY MOUTH 2 TIMES A DAY AS NEEDED FOR ANXIETY, Disp: 20  tablet, Rfl: 1   Magnesium  200 MG TABS, Take 1 tablet (200 mg total) by mouth daily., Disp: 30 tablet, Rfl:    Melatonin 5 MG TABS, Take 10 mg by mouth at bedtime as needed., Disp: , Rfl:    metoprolol  succinate (TOPROL -XL) 100 MG 24 hr tablet, TAKE 1 TABLET BY MOUTH EVERY MORNING AND TAKE 1 TABLET BY MOUTH EVERY NIGHT AT BEDTIME **TAKE WITH OR IMMEDIATELY FOLLOWING A MEAL**, Disp: 180 tablet, Rfl: 0   omeprazole  (PRILOSEC) 40 MG capsule, TAKE 1 CAPSULE BY MOUTH 2 TIMES A DAY, Disp: 180 capsule, Rfl: 0   ONETOUCH ULTRA test strip, USE ONE STRIP TO TEST TWICE A DAY, Disp: 200 strip, Rfl: 3   rosuvastatin  (CRESTOR ) 40 MG tablet, TAKE 1 TABLET BY MOUTH DAILY, Disp: 90 tablet, Rfl: 1   Semaglutide , 1 MG/DOSE, (OZEMPIC , 1 MG/DOSE,) 4 MG/3ML SOPN, DIAL AND INJECT UNDER THE SKIN 1 MG WEEKLY, Disp: 3 mL, Rfl: 3   traZODone  (DESYREL ) 50 MG tablet, TAKE 1 TABLET BY MOUTH EVERY NIGHT AT BEDTIME AS NEEDED FOR SLEEP, Disp: 30 tablet, Rfl: 3        Objective:        BP 112/84 (BP Location: Left Arm, Patient Position: Sitting, Cuff Size: Large)   Pulse 83   Temp 98.2 F (36.8 C) (Temporal)   Ht 5' 4.5 (1.638 m)   Wt 280 lb 3.2 oz (127.1 kg)   LMP 04/25/2014 (Approximate)   SpO2 98%   BMI 47.35 kg/m   Physical Exam VITALS: P- 83, BP- 112/84 ABDOMEN: Tender to palpation with 8/10 pain on deep palpation.  Wt Readings from Last 3 Encounters:  02/13/24 280 lb 3.2 oz (127.1 kg)  12/30/23 285 lb (129.3 kg)  10/01/23 290 lb 12.8 oz (131.9 kg)    Physical Exam Constitutional:      General: She is not in acute distress.    Appearance: Normal appearance. She is normal weight. She is not ill-appearing, toxic-appearing or diaphoretic.  HENT:     Head: Normocephalic.     Right Ear: Tympanic membrane normal.     Left Ear: Tympanic membrane normal.  Eyes:     Conjunctiva/sclera: Conjunctivae normal.     Pupils: Pupils are equal, round, and reactive to light.  Cardiovascular:     Rate and  Rhythm: Normal rate and regular rhythm.  Pulmonary:     Effort: Pulmonary effort is normal.     Breath sounds: Normal breath sounds.  Abdominal:     General: Bowel sounds are decreased.     Tenderness: There is abdominal tenderness in the right upper quadrant, left upper quadrant and left lower quadrant. Positive signs include Murphy's sign (mildly).  Musculoskeletal:        General: Normal range of motion.  Skin:    Coloration: Skin is jaundiced (mildly potentially).  Neurological:     General: No focal deficit  present.     Mental Status: She is alert and oriented to person, place, and time. Mental status is at baseline.  Psychiatric:        Mood and Affect: Mood normal.        Behavior: Behavior normal.        Thought Content: Thought content normal.        Judgment: Judgment normal.          Results   Assessment & Plan:   Assessment and Plan Assessment & Plan Nausea and vomiting with abdominal tenderness and constipation, rule out pancreatitis Intermittent nausea and vomiting since Sunday evening, with episodes of dry heaves. Associated with significant weakness and inability to maintain adequate oral intake. Abdominal tenderness, particularly in the upper abdomen, with pain score up to 8/10 upon deep palpation. Constipation present, with a recent bowel movement after five to six days. Differential diagnosis includes constipation, gallbladder issues, diverticulitis, liver or pancreatic pathology, and potential pancreatitis, especially considering semaglutide  (Ozempic ) use. No recent dose increase of Ozempic . No diarrhea, abdominal pain, or urinary symptoms. Slightly decreased urine output. No jaundice, though a slight yellow tone was considered. No heartburn. - Order CT scan of the abdomen to evaluate for potential pancreatitis, gallbladder issues, or other abdominal pathology. Ddx cholecystitis, biliary duct obstruction, intestinal obstruction, pancreatitis, diverticulitis  -  Obtain laboratory tests to assess white blood cell count, liver enzymes, and pancreatic enzymes. - Encourage hydration with water and diluted Gatorade to maintain fluid and electrolyte balance. - Advise to provide a urine sample if possible to rule out infection. - Coordinate with referral coordinator for CT scan approval and scheduling, preferably in [place] if covered by insurance. -ER precautions advised  -consider ozempic  as causative   -authorization pending for CT, may not be able to have approved for today. Will send for stat u/s ruq to r/o liver vs gallbladder concerns. Pending labs.   Recording duration: 11 minutes      Return in about 1 week (around 02/20/2024) for with pcp for abdominal concerns.     Ginger Patrick, MSN, APRN, FNP-C [facility]

## 2024-02-13 NOTE — Addendum Note (Signed)
 Addended by: HOPE VEVA PARAS on: 02/13/2024 01:24 PM   Modules accepted: Orders

## 2024-02-14 ENCOUNTER — Ambulatory Visit
Admission: RE | Admit: 2024-02-14 | Discharge: 2024-02-14 | Disposition: A | Discharge status: Home / Self Care | Source: Ambulatory Visit | Attending: Family | Admitting: Family

## 2024-02-14 DIAGNOSIS — R3915 Urgency of urination: Secondary | ICD-10-CM | POA: Insufficient documentation

## 2024-02-14 DIAGNOSIS — R1084 Generalized abdominal pain: Secondary | ICD-10-CM | POA: Insufficient documentation

## 2024-02-14 DIAGNOSIS — R1114 Bilious vomiting: Secondary | ICD-10-CM | POA: Insufficient documentation

## 2024-02-14 DIAGNOSIS — K5901 Slow transit constipation: Secondary | ICD-10-CM | POA: Diagnosis present

## 2024-02-14 LAB — URINE CULTURE
MICRO NUMBER:: 16864738
SPECIMEN QUALITY:: ADEQUATE

## 2024-02-14 LAB — HEMOGLOBIN A1C
Hgb A1c MFr Bld: 10.2 % — ABNORMAL HIGH (ref <5.7)
Mean Plasma Glucose: 246 mg/dL
eAG (mmol/L): 13.6 mmol/L

## 2024-02-14 MED ORDER — IOHEXOL 300 MG/ML  SOLN
100.0000 mL | Freq: Once | INTRAMUSCULAR | Status: AC | PRN
  Administered 2024-02-14: 100 mL via INTRAVENOUS

## 2024-02-14 NOTE — Progress Notes (Signed)
 noted

## 2024-02-15 ENCOUNTER — Encounter: Payer: Self-pay | Admitting: Family Medicine

## 2024-02-20 ENCOUNTER — Other Ambulatory Visit

## 2024-02-21 ENCOUNTER — Other Ambulatory Visit: Payer: Self-pay | Admitting: Family Medicine

## 2024-02-21 DIAGNOSIS — F331 Major depressive disorder, recurrent, moderate: Secondary | ICD-10-CM

## 2024-02-25 NOTE — Telephone Encounter (Signed)
 Change in therapy. Per 1/230/24 OV notes, dose was increased to 15 mg at bedtime. New rx sent 06/24/23, #90/1 refill to [facility].   Request denied.

## 2024-02-27 ENCOUNTER — Encounter (HOSPITAL_COMMUNITY): Payer: Self-pay

## 2024-03-02 ENCOUNTER — Ambulatory Visit: Admitting: Family Medicine

## 2024-03-02 ENCOUNTER — Encounter: Payer: Self-pay | Admitting: Family Medicine

## 2024-03-02 ENCOUNTER — Encounter: Payer: Self-pay | Admitting: Emergency Medicine

## 2024-03-02 ENCOUNTER — Telehealth (HOSPITAL_COMMUNITY): Payer: Self-pay | Admitting: *Deleted

## 2024-03-02 VITALS — BP 118/70 | HR 80 | Temp 98.5°F | Ht 64.5 in | Wt 285.2 lb

## 2024-03-02 DIAGNOSIS — E611 Iron deficiency: Secondary | ICD-10-CM

## 2024-03-02 DIAGNOSIS — D6859 Other primary thrombophilia: Secondary | ICD-10-CM

## 2024-03-02 DIAGNOSIS — N760 Acute vaginitis: Secondary | ICD-10-CM

## 2024-03-02 DIAGNOSIS — Z0000 Encounter for general adult medical examination without abnormal findings: Secondary | ICD-10-CM

## 2024-03-02 DIAGNOSIS — F419 Anxiety disorder, unspecified: Secondary | ICD-10-CM

## 2024-03-02 DIAGNOSIS — G4733 Obstructive sleep apnea (adult) (pediatric): Secondary | ICD-10-CM

## 2024-03-02 DIAGNOSIS — Z9889 Other specified postprocedural states: Secondary | ICD-10-CM

## 2024-03-02 DIAGNOSIS — E538 Deficiency of other specified B group vitamins: Secondary | ICD-10-CM

## 2024-03-02 DIAGNOSIS — M797 Fibromyalgia: Secondary | ICD-10-CM | POA: Diagnosis not present

## 2024-03-02 DIAGNOSIS — K219 Gastro-esophageal reflux disease without esophagitis: Secondary | ICD-10-CM

## 2024-03-02 DIAGNOSIS — E559 Vitamin D deficiency, unspecified: Secondary | ICD-10-CM

## 2024-03-02 DIAGNOSIS — Z86711 Personal history of pulmonary embolism: Secondary | ICD-10-CM

## 2024-03-02 DIAGNOSIS — E1169 Type 2 diabetes mellitus with other specified complication: Secondary | ICD-10-CM

## 2024-03-02 DIAGNOSIS — Z1211 Encounter for screening for malignant neoplasm of colon: Secondary | ICD-10-CM

## 2024-03-02 DIAGNOSIS — K760 Fatty (change of) liver, not elsewhere classified: Secondary | ICD-10-CM

## 2024-03-02 DIAGNOSIS — I10 Essential (primary) hypertension: Secondary | ICD-10-CM

## 2024-03-02 DIAGNOSIS — F331 Major depressive disorder, recurrent, moderate: Secondary | ICD-10-CM

## 2024-03-02 DIAGNOSIS — R600 Localized edema: Secondary | ICD-10-CM

## 2024-03-02 DIAGNOSIS — R87610 Atypical squamous cells of undetermined significance on cytologic smear of cervix (ASC-US): Secondary | ICD-10-CM

## 2024-03-02 DIAGNOSIS — Z1231 Encounter for screening mammogram for malignant neoplasm of breast: Secondary | ICD-10-CM

## 2024-03-02 DIAGNOSIS — E785 Hyperlipidemia, unspecified: Secondary | ICD-10-CM

## 2024-03-02 DIAGNOSIS — K5901 Slow transit constipation: Secondary | ICD-10-CM

## 2024-03-02 DIAGNOSIS — Z7189 Other specified counseling: Secondary | ICD-10-CM

## 2024-03-02 DIAGNOSIS — R519 Headache, unspecified: Secondary | ICD-10-CM

## 2024-03-02 DIAGNOSIS — E118 Type 2 diabetes mellitus with unspecified complications: Secondary | ICD-10-CM

## 2024-03-02 DIAGNOSIS — Z860100 Personal history of colon polyps, unspecified: Secondary | ICD-10-CM

## 2024-03-02 DIAGNOSIS — Z87891 Personal history of nicotine dependence: Secondary | ICD-10-CM

## 2024-03-02 MED ORDER — DILTIAZEM HCL ER BEADS 180 MG PO CP24: 180.0000 mg | ORAL_CAPSULE | Freq: Every day | ORAL | 3 refills | Status: AC

## 2024-03-02 MED ORDER — ROSUVASTATIN CALCIUM 40 MG PO TABS: 40.0000 mg | ORAL_TABLET | Freq: Every day | ORAL | 3 refills | Status: AC

## 2024-03-02 MED ORDER — GLIMEPIRIDE 4 MG PO TABS: 4.0000 mg | ORAL_TABLET | Freq: Every day | ORAL | 3 refills | Status: AC

## 2024-03-02 MED ORDER — BUSPIRONE HCL 15 MG PO TABS: 15.0000 mg | ORAL_TABLET | Freq: Every day | ORAL | 3 refills | Status: AC

## 2024-03-02 MED ORDER — OZEMPIC (0.25 OR 0.5 MG/DOSE) 2 MG/3ML ~~LOC~~ SOPN: 0.5000 mg | PEN_INJECTOR | SUBCUTANEOUS | 11 refills | Status: AC

## 2024-03-02 MED ORDER — TRAZODONE HCL 50 MG PO TABS: 50.0000 mg | ORAL_TABLET | Freq: Every day | ORAL | 3 refills | Status: AC

## 2024-03-02 MED ORDER — BUPROPION HCL ER (SR) 150 MG PO TB12: 150.0000 mg | ORAL_TABLET | Freq: Two times a day (BID) | ORAL | 3 refills | Status: AC

## 2024-03-02 MED ORDER — BACLOFEN 10 MG PO TABS: 10.0000 mg | ORAL_TABLET | Freq: Three times a day (TID) | ORAL | 3 refills | Status: AC

## 2024-03-02 MED ORDER — HYDROXYZINE HCL 25 MG PO TABS: 25.0000 mg | ORAL_TABLET | Freq: Two times a day (BID) | ORAL | 3 refills | Status: DC | PRN

## 2024-03-02 MED ORDER — HYDROCHLOROTHIAZIDE 25 MG PO TABS: 25.0000 mg | ORAL_TABLET | Freq: Every day | ORAL | 1 refills | Status: AC | PRN

## 2024-03-02 MED ORDER — LORAZEPAM 0.5 MG PO TABS: 0.5000 mg | ORAL_TABLET | Freq: Two times a day (BID) | ORAL | 0 refills | Status: DC | PRN

## 2024-03-02 MED ORDER — OMEPRAZOLE 40 MG PO CPDR: 40.0000 mg | DELAYED_RELEASE_CAPSULE | Freq: Two times a day (BID) | ORAL | 1 refills | Status: AC

## 2024-03-02 MED ORDER — FLUCONAZOLE 150 MG PO TABS: 150.0000 mg | ORAL_TABLET | Freq: Once | ORAL | 0 refills | Status: AC

## 2024-03-02 MED ORDER — METOPROLOL SUCCINATE ER 100 MG PO TB24: ORAL_TABLET | ORAL | 3 refills | Status: AC

## 2024-03-02 NOTE — Assessment & Plan Note (Signed)
 Stable period on prc hydrochlorothiazide , rare use.

## 2024-03-02 NOTE — Assessment & Plan Note (Signed)
 Continue lifelong low dose eliquis . H/o recurrent PE/DVT

## 2024-03-02 NOTE — Assessment & Plan Note (Signed)
 Declines rpt colonoscopy. Agrees to start with Cologard.

## 2024-03-02 NOTE — Telephone Encounter (Signed)
 Reaching out to patient to offer assistance regarding upcoming cardiac imaging study; pt verbalizes understanding of appt date/time, parking situation and where to check in, pre-test NPO and verified current allergies; name and call back number provided for further questions should they arise  Chantal Requena RN Navigator Cardiac Imaging [facility] [phone] office [phone] cell  Patient aware to avoid caffeine 12 hours prior to her cardiac PET study.

## 2024-03-02 NOTE — Assessment & Plan Note (Signed)
 Preventative protocols reviewed and updated unless pt declined. Discussed healthy diet and lifestyle.

## 2024-03-02 NOTE — Assessment & Plan Note (Signed)
 Doing better off ozempic , will retrial lower dose.  Consider mounjaro.

## 2024-03-02 NOTE — Assessment & Plan Note (Addendum)
 Continues monthly B12 shots at home.

## 2024-03-02 NOTE — Assessment & Plan Note (Signed)
 May be eligible for lung cancer screening program - will refer.  Last quit smoking 2024.

## 2024-03-02 NOTE — Patient Instructions (Addendum)
 Restart lower ozempic  dose 0.5mg  weekly sent to pharmacy.  Call to schedule mammogram at your convenience: [facility] [phone] Sugars were too high - restart lower dose ozempic  0.5mg  weekly, let us  know if any trouble tolerating this lower dose.  Take diflucan  for possible yeast infection - return for further evaluation if not improved.  Good to see you today  Return in 3 months for diabetes follow up visit

## 2024-03-02 NOTE — Assessment & Plan Note (Signed)
 Previously discussed.

## 2024-03-02 NOTE — Assessment & Plan Note (Signed)
 Chronic, deteriorated control on ozempic  1mg  and glimepiride  4mg  daily.  Metformin  intolerance. Ozempic  currently on hold after recent GI symptoms - will restart 0.5mg  weekly.  She will also renew efforts towards diabetic diet.  Consider additional medication pending above effect.

## 2024-03-02 NOTE — Assessment & Plan Note (Signed)
 Continues omeprazole  bid, longstanding. No recent EGD.

## 2024-03-02 NOTE — Assessment & Plan Note (Signed)
 Iron levels ok, iron stores low, intolerance to oral iron (constipation).

## 2024-03-02 NOTE — Assessment & Plan Note (Signed)
 H/o untreated OSA

## 2024-03-02 NOTE — Assessment & Plan Note (Addendum)
 Fatty liver noted on previous imaging, not commented on latest imaging.

## 2024-03-02 NOTE — Assessment & Plan Note (Signed)
 Discussed need to repeat pap. Declines repeat pap today.

## 2024-03-02 NOTE — Assessment & Plan Note (Signed)
Chronic, great control on current regimen - continue.

## 2024-03-02 NOTE — Assessment & Plan Note (Signed)
 Continue to encourage healthy diet and lifestyle choices to affect sustainable weight loss.  She states she plans on regularly going to gym if heart study returns reassuring tomorrow.
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
## 2024-03-02 NOTE — Assessment & Plan Note (Signed)
 Continue vit D 2000 units daily
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
## 2024-03-02 NOTE — Assessment & Plan Note (Signed)
 Describes vulvovaginal irritation, declines exam today, declines pap smear for this reason.  Rx diflucan  150mg  x1, rpt 4d if needed. Rec OV for further eval if above doesn't improve symptoms.

## 2024-03-02 NOTE — Assessment & Plan Note (Addendum)
 Chronic, stable period in setting of grieving husband's passing earlier this year.  Continue current regimen including buspar , wellbutrin , trazodone .

## 2024-03-02 NOTE — Assessment & Plan Note (Signed)
 Chronic, continue rosuvastatin  40mg  daily. The ASCVD Risk score (Arnett DK, et al., 2019) failed to calculate for the following reasons:   The valid total cholesterol range is 130 to 320 mg/dL

## 2024-03-03 ENCOUNTER — Ambulatory Visit (HOSPITAL_COMMUNITY)
Admission: RE | Admit: 2024-03-03 | Discharge: 2024-03-03 | Disposition: A | Discharge status: Home / Self Care | Source: Ambulatory Visit | Attending: Cardiology | Admitting: Cardiology

## 2024-03-03 DIAGNOSIS — R0602 Shortness of breath: Secondary | ICD-10-CM | POA: Insufficient documentation

## 2024-03-03 MED ORDER — REGADENOSON 0.4 MG/5ML IV SOLN
INTRAVENOUS | Status: AC
Start: 2024-03-03 — End: 2024-03-03
  Filled 2024-03-03: qty 5

## 2024-03-03 MED ORDER — REGADENOSON 0.4 MG/5ML IV SOLN
0.4000 mg | Freq: Once | INTRAVENOUS | Status: DC
Start: 2024-03-03 — End: 2024-03-09

## 2024-03-04 ENCOUNTER — Ambulatory Visit (HOSPITAL_COMMUNITY)

## 2024-03-18 ENCOUNTER — Encounter (HOSPITAL_COMMUNITY): Payer: Self-pay

## 2024-03-18 ENCOUNTER — Other Ambulatory Visit (HOSPITAL_COMMUNITY): Payer: Self-pay | Admitting: *Deleted

## 2024-03-18 ENCOUNTER — Other Ambulatory Visit: Payer: Self-pay

## 2024-03-18 DIAGNOSIS — R0602 Shortness of breath: Secondary | ICD-10-CM

## 2024-03-18 MED ORDER — APIXABAN 2.5 MG PO TABS: 2.5000 mg | ORAL_TABLET | Freq: Two times a day (BID) | ORAL | 6 refills | Status: AC

## 2024-03-19 ENCOUNTER — Telehealth: Payer: Self-pay

## 2024-03-19 ENCOUNTER — Ambulatory Visit: Admission: RE | Admit: 2024-03-19 | Source: Ambulatory Visit

## 2024-03-19 ENCOUNTER — Other Ambulatory Visit (HOSPITAL_COMMUNITY): Payer: Self-pay

## 2024-03-19 ENCOUNTER — Other Ambulatory Visit: Payer: Self-pay

## 2024-03-19 DIAGNOSIS — E538 Deficiency of other specified B group vitamins: Secondary | ICD-10-CM

## 2024-03-19 DIAGNOSIS — F419 Anxiety disorder, unspecified: Secondary | ICD-10-CM

## 2024-03-19 MED ORDER — CYANOCOBALAMIN 1000 MCG/ML IJ SOLN: INTRAMUSCULAR | 2 refills | Status: DC

## 2024-03-19 NOTE — Telephone Encounter (Signed)
 Name of Medication: Lorazepam  0.5 Name of Pharmacy: [facility]  Last Fill or Written Date and Quantity: 03/02/24 #20 no rf  Last Office Visit and Type: 03/02/24 CPE  Next Office Visit and Type: n/a

## 2024-03-20 ENCOUNTER — Other Ambulatory Visit: Payer: Self-pay | Admitting: Family Medicine

## 2024-03-20 DIAGNOSIS — K219 Gastro-esophageal reflux disease without esophagitis: Secondary | ICD-10-CM

## 2024-03-20 MED ORDER — LORAZEPAM 0.5 MG PO TABS: 0.5000 mg | ORAL_TABLET | Freq: Two times a day (BID) | ORAL | 0 refills | Status: DC | PRN

## 2024-03-20 NOTE — Telephone Encounter (Signed)
 ERx

## 2024-04-01 ENCOUNTER — Ambulatory Visit: Admitting: Oncology

## 2024-04-01 ENCOUNTER — Other Ambulatory Visit

## 2024-04-07 ENCOUNTER — Encounter (HOSPITAL_COMMUNITY): Payer: Self-pay

## 2024-04-09 ENCOUNTER — Ambulatory Visit
Admission: RE | Admit: 2024-04-09 | Discharge: 2024-04-09 | Disposition: A | Discharge status: Home / Self Care | Source: Ambulatory Visit | Attending: Cardiology | Admitting: Cardiology

## 2024-04-09 DIAGNOSIS — I2510 Atherosclerotic heart disease of native coronary artery without angina pectoris: Secondary | ICD-10-CM | POA: Diagnosis not present

## 2024-04-09 DIAGNOSIS — D3502 Benign neoplasm of left adrenal gland: Secondary | ICD-10-CM | POA: Insufficient documentation

## 2024-04-09 DIAGNOSIS — D3501 Benign neoplasm of right adrenal gland: Secondary | ICD-10-CM | POA: Insufficient documentation

## 2024-04-09 DIAGNOSIS — I517 Cardiomegaly: Secondary | ICD-10-CM | POA: Insufficient documentation

## 2024-04-09 DIAGNOSIS — R0602 Shortness of breath: Secondary | ICD-10-CM | POA: Insufficient documentation

## 2024-04-09 LAB — NM PET CT CARDIAC PERFUSION MULTI W/ABSOLUTE BLOODFLOW
LV dias vol: 92 mL (ref 46–106)
MBFR: 2.67
Nuc Rest EF: 57 %
Nuc Stress EF: 68 %
Peak HR: 75 {beats}/min
Rest HR: 63 {beats}/min
Rest MBF: 0.82 ml/g/min
Rest Nuclear Isotope Dose: 25.3 mCi
SRS: 0
SSS: 0
ST Depression (mm): 0 mm
Stress MBF: 2.19 ml/g/min
Stress Nuclear Isotope Dose: 24.8 mCi
TID: 0.95

## 2024-04-09 MED ORDER — RUBIDIUM RB82 GENERATOR (RUBYFILL)
25.0000 | PACK | Freq: Once | INTRAVENOUS | Status: AC
  Administered 2024-04-09: 24.81 via INTRAVENOUS

## 2024-04-09 MED ORDER — RUBIDIUM RB82 GENERATOR (RUBYFILL)
25.0000 | PACK | Freq: Once | INTRAVENOUS | Status: AC
  Administered 2024-04-09: 25.32 via INTRAVENOUS

## 2024-04-09 MED ORDER — REGADENOSON 0.4 MG/5ML IV SOLN
INTRAVENOUS | Status: AC
  Filled 2024-04-09: qty 5

## 2024-04-09 MED ORDER — REGADENOSON 0.4 MG/5ML IV SOLN
0.4000 mg | Freq: Once | INTRAVENOUS | Status: AC
  Administered 2024-04-09: 0.4 mg via INTRAVENOUS
  Filled 2024-04-09: qty 5

## 2024-04-12 ENCOUNTER — Ambulatory Visit: Payer: Self-pay | Admitting: Cardiology

## 2024-04-14 ENCOUNTER — Ambulatory Visit: Payer: Self-pay

## 2024-04-14 NOTE — Telephone Encounter (Signed)
 FYI Only or Action Required?: FYI only for provider.  Patient was last seen in primary care on 03/02/2024 by Rilla Baller, MD.  Called Nurse Triage reporting Abscess.  Symptoms began ongoing, worse for the last week.  Symptoms are: gradually worsening.  Triage Disposition: See Physician Within 24 Hours  Patient/caregiver understands and will follow disposition?: Yes      Copied from CRM [id number]. Topic: Clinical - Red Word Triage >> Apr 14, 2024  4:49 PM Kathleen Good wrote: Bump on inner thigh that is extremely painful, red and swelling       Reason for Disposition  Boil > 2 inches across (> 5 cm; larger than a golf ball or ping pong ball)  Answer Assessment - Initial Assessment Questions 1. APPEARANCE of BOIL: What does the boil look like?      Red and swollen 2. LOCATION: Where is the boil located?      Left inner thigh  3. NUMBER: How many boils are there?      1 4. SIZE: How big is the boil? (e.g., inches, cm; compare to size of a coin or other object)     About the size of a 50 cent piece  5. ONSET: When did the boil start?     Has been there for a while, worse over the last week  6. PAIN: Is there any pain? If Yes, ask: How bad is the pain?   (Scale 1-10; or mild, moderate, severe)     8/10 7. FEVER: Do you have a fever? If Yes, ask: What is it, how was it measured, and when did it start?      No 8. SOURCE: Have you been around anyone with boils or other Staph infections? Have you ever had boils before?     Unsure, ongoing problem  9. OTHER SYMPTOMS: Do you have any other symptoms? (e.g., shaking chills, weakness, rash elsewhere on body)     No  Protocols used: Boil (Skin Abscess)-A-AH

## 2024-04-15 ENCOUNTER — Ambulatory Visit: Admitting: General Practice

## 2024-04-15 NOTE — Telephone Encounter (Addendum)
 Appreciate Baleen seeing this pleasant patient tomorrow. No noted systemic symptoms. H/o diabetes, h/o recurrent boils.

## 2024-04-15 NOTE — Telephone Encounter (Signed)
 Noted

## 2024-04-16 ENCOUNTER — Encounter: Payer: Self-pay | Admitting: General Practice

## 2024-04-16 ENCOUNTER — Ambulatory Visit (INDEPENDENT_AMBULATORY_CARE_PROVIDER_SITE_OTHER): Admitting: General Practice

## 2024-04-16 ENCOUNTER — Other Ambulatory Visit: Payer: Self-pay | Admitting: Family Medicine

## 2024-04-16 VITALS — BP 126/68 | HR 81 | Temp 98.5°F | Ht 64.5 in | Wt 281.0 lb

## 2024-04-16 DIAGNOSIS — F419 Anxiety disorder, unspecified: Secondary | ICD-10-CM

## 2024-04-16 DIAGNOSIS — L0291 Cutaneous abscess, unspecified: Secondary | ICD-10-CM | POA: Diagnosis not present

## 2024-04-16 MED ORDER — DOXYCYCLINE HYCLATE 100 MG PO TABS: 100.0000 mg | ORAL_TABLET | Freq: Two times a day (BID) | ORAL | 0 refills | Status: AC

## 2024-04-16 NOTE — Patient Instructions (Signed)
 Warm compresses.  Tylenol  as needed for pain.   Start Doxycycline  antibiotic for the infection. Take 1 tablet by mouth twice daily for 10 days.  Follow up if symptoms worsen or do not improve.   It was a pleasure meeting you!
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
## 2024-04-17 NOTE — Telephone Encounter (Signed)
 Name of Medication: Lorazepam   Name of Pharmacy: [facility] Last [place] or Written Date and Quantity: 03/20/24 Last Office Visit and Type: 03/02/24 Next Office Visit and Type: no appts made

## 2024-04-20 ENCOUNTER — Ambulatory Visit: Payer: Self-pay

## 2024-04-20 NOTE — Telephone Encounter (Addendum)
 Called patient she states that she has not been able to eat or drink much at all. States that she has sipped on water but not able to keep down. She is not able to tell me if urine output is normal.  She tried to sip on some chicken broth today but only made her more nauseous. This started yesterday morning. She started on abx on 10/23. She is not able to see the abscess due to location but she states it is warm to the touch and shape has changed to more flat and wide. Denies any discharge from area. Her fever has been off and on x 1 day. Had her check while on the phone and temporal reading was 99.3. she is complaining of body aches and headache. She states she does not even fell like driving anywhere. She would like to wait until office visit later in the week. I advised patient that it is our recommendation that she be seen today in ED or UC. She declined recommendation. Advised that with with new increased symptoms it is very important that she be seen. Patient agreed if any new symptoms or symptoms increase over nigh she will call 911. If she is able to get ride she will go to urgent care in the morning.

## 2024-04-20 NOTE — Telephone Encounter (Signed)
 FYI Only or Action Required?: Action required by provider: update on patient condition.  Patient was last seen in primary care on 04/16/2024 by Kathleen Shivers, NP.  Called Nurse Triage reporting Cyst.  Symptoms began several weeks ago.  Interventions attempted: Prescription medications: doxycycline , Rest, hydration, or home remedies, and Ice/heat application.  Symptoms are: gradually worsening.  Triage Disposition: See Today or Tomorrow in Office (overriding Call PCP Within 24 Hours)  Patient/caregiver understands and will follow disposition?: Yes, but will wait    Copied from CRM [id number]. Topic: Clinical - Red Word Triage >> Apr 20, 2024  2:40 PM [name] wrote: Red Word that prompted transfer to Nurse Triage: Patient has cyst on inner thigh- was seen on 10/23, she is not doing well- has been throwing up- fever of 103 Reason for Disposition  [1] Taking antibiotic AND [2] new-onset of fever  Answer Assessment - Initial Assessment Questions Additional info: Patient requesting a follow up appointment with provider who can potentially lance her cyst. She has been on antibiotics since 04/16/24 and cyst is increasing in size, fever and vomited overnight but has since resolved.   Blood sugar 147     1. INFECTION: What infection is the antibiotic being given for?     cyst 2. ANTIBIOTIC: What antibiotic are you taking How many times per day?     doxycycline  3. DURATION: When was the antibiotic started?     04/16/24 4. MAIN CONCERN OR SYMPTOM:  What is your main concern right now?     New fever, cyst now open and draining intermittently but then sealing again, feels she may need it lanced.  5. BETTER-SAME-WORSE: Are you getting better, staying the same, or getting worse compared to when you first started the antibiotics? If getting worse, ask: In what way?      worse 6. FEVER: Do you have a fever? If Yes, ask: What is your temperature, how was it measured, and when  did it start?     99.0 without medication, last night before bed was 103.0  7. SYMPTOMS: Are there any other symptoms you're concerned about? If Yes, ask: When did it start?     Vomited overnight X1.  8. FOLLOW-UP APPOINTMENT: Do you have a follow-up appointment with your doctor?     No  Protocols used: Infection on Antibiotic Follow-up Call-A-[facility]

## 2024-04-20 NOTE — Telephone Encounter (Signed)
 ERx

## 2024-04-21 ENCOUNTER — Encounter: Payer: Self-pay | Admitting: Emergency Medicine

## 2024-04-21 ENCOUNTER — Other Ambulatory Visit: Payer: Self-pay

## 2024-04-21 ENCOUNTER — Emergency Department
Admission: EM | Admit: 2024-04-21 | Discharge: 2024-04-21 | Disposition: A | Discharge status: Home / Self Care | Attending: Emergency Medicine | Admitting: Emergency Medicine

## 2024-04-21 ENCOUNTER — Emergency Department

## 2024-04-21 DIAGNOSIS — I10 Essential (primary) hypertension: Secondary | ICD-10-CM | POA: Insufficient documentation

## 2024-04-21 DIAGNOSIS — L0291 Cutaneous abscess, unspecified: Secondary | ICD-10-CM

## 2024-04-21 DIAGNOSIS — E119 Type 2 diabetes mellitus without complications: Secondary | ICD-10-CM | POA: Insufficient documentation

## 2024-04-21 DIAGNOSIS — L02416 Cutaneous abscess of left lower limb: Secondary | ICD-10-CM | POA: Diagnosis not present

## 2024-04-21 DIAGNOSIS — R2242 Localized swelling, mass and lump, left lower limb: Secondary | ICD-10-CM | POA: Diagnosis present

## 2024-04-21 LAB — CBC WITH DIFFERENTIAL/PLATELET
Abs Immature Granulocytes: 0.05 K/uL (ref 0.00–0.07)
Basophils Absolute: 0.1 K/uL (ref 0.0–0.1)
Basophils Relative: 1 %
Eosinophils Absolute: 0.1 K/uL (ref 0.0–0.5)
Eosinophils Relative: 1 %
HCT: 41 % (ref 36.0–46.0)
Hemoglobin: 13.2 g/dL (ref 12.0–15.0)
Immature Granulocytes: 0 %
Lymphocytes Relative: 17 %
Lymphs Abs: 2.2 K/uL (ref 0.7–4.0)
MCH: 28.1 pg (ref 26.0–34.0)
MCHC: 32.2 g/dL (ref 30.0–36.0)
MCV: 87.2 fL (ref 80.0–100.0)
Monocytes Absolute: 1.2 K/uL — ABNORMAL HIGH (ref 0.1–1.0)
Monocytes Relative: 9 %
Neutro Abs: 9.6 K/uL — ABNORMAL HIGH (ref 1.7–7.7)
Neutrophils Relative %: 72 %
Platelets: 264 K/uL (ref 150–400)
RBC: 4.7 MIL/uL (ref 3.87–5.11)
RDW: 15.2 % (ref 11.5–15.5)
WBC: 13.2 K/uL — ABNORMAL HIGH (ref 4.0–10.5)
nRBC: 0 % (ref 0.0–0.2)

## 2024-04-21 LAB — BASIC METABOLIC PANEL WITH GFR
Anion gap: 12 (ref 5–15)
BUN: 11 mg/dL (ref 8–23)
CO2: 23 mmol/L (ref 22–32)
Calcium: 8.7 mg/dL — ABNORMAL LOW (ref 8.9–10.3)
Chloride: 102 mmol/L (ref 98–111)
Creatinine, Ser: 1.14 mg/dL — ABNORMAL HIGH (ref 0.44–1.00)
GFR, Estimated: 55 mL/min — ABNORMAL LOW (ref >60)
Glucose, Bld: 165 mg/dL — ABNORMAL HIGH (ref 70–99)
Potassium: 3.6 mmol/L (ref 3.5–5.1)
Sodium: 137 mmol/L (ref 135–145)

## 2024-04-21 LAB — RESP PANEL BY RT-PCR (RSV, FLU A&B, COVID)  RVPGX2
Influenza A by PCR: NEGATIVE
Influenza B by PCR: NEGATIVE
Resp Syncytial Virus by PCR: NEGATIVE
SARS Coronavirus 2 by RT PCR: NEGATIVE

## 2024-04-21 LAB — LACTIC ACID, PLASMA: Lactic Acid, Venous: 1.7 mmol/L (ref 0.5–1.9)

## 2024-04-21 MED ORDER — LIDOCAINE HCL (PF) 1 % IJ SOLN
10.0000 mL | Freq: Once | INTRAMUSCULAR | Status: AC
  Administered 2024-04-21: 10 mL
  Filled 2024-04-21: qty 10

## 2024-04-21 MED ORDER — HYDROMORPHONE HCL 1 MG/ML IJ SOLN
0.5000 mg | Freq: Once | INTRAMUSCULAR | Status: AC
  Administered 2024-04-21: 0.5 mg via INTRAVENOUS
  Filled 2024-04-21: qty 0.5

## 2024-04-21 NOTE — ED Provider Notes (Signed)
 [facility] Provider Note    Event Date/Time   First MD Initiated Contact with Patient 04/21/24 1352     (approximate)   History   Abscess   HPI  Kathleen Good is a 63 y.o. female with a history of PE, chronic back pain, hypertension, dyslipidemia, and type 2 diabetes who presents with an area of swelling and redness to her left inner thigh.  She states that there has been a small cyst there for a few months, but it acutely worsened a week ago.  Several days ago she was started on doxycycline  but states that over the last couple of days, the area of redness has increased in size and become flatter but wider.  It is more painful especially when she moves.  Over the last day she has developed fever to as high as 103 yesterday and reports some chills and nausea as well as a cough.  She denies difficulty breathing.  She has no chest pain.  She denies any urinary symptoms or diarrhea.  There is no purulent drainage from the cyst.  I reviewed the past medical records.  The patient was seen in [place] primary care on 10/23 reporting the left inner thigh mass which was diagnosed as a chronic abscess.  She was started on doxycycline  at that time.   Physical Exam   Triage Vital Signs: ED Triage Vitals  Encounter Vitals Group     BP 04/21/24 1329 (!) 137/96     Girls Systolic BP Percentile --      Girls Diastolic BP Percentile --      Boys Systolic BP Percentile --      Boys Diastolic BP Percentile --      Pulse Rate 04/21/24 1329 97     Resp 04/21/24 1329 18     Temp 04/21/24 1329 99.5 F (37.5 C)     Temp Source 04/21/24 1329 Oral     SpO2 04/21/24 1329 99 %     Weight 04/21/24 1330 280 lb (127 kg)     Height 04/21/24 1330 5' 4.5 (1.638 m)     Head Circumference --      Peak Flow --      Pain Score 04/21/24 1330 8     Pain Loc --      Pain Education --      Exclude from Growth Chart --     Most recent vital signs: Vitals:   04/21/24 1329  BP: (!)  137/96  Pulse: 97  Resp: 18  Temp: 99.5 F (37.5 C)  SpO2: 99%     General: Alert, relatively well-appearing, no distress.  CV:  Good peripheral perfusion.  Resp:  Normal effort.  Lungs CTAB. Abd:  No distention.  Other:  Approximately 10 to 15 cm area of erythema, induration, with central fluctuance to the left inner thigh.   ED Results / Procedures / Treatments   Labs (all labs ordered are listed, but only abnormal results are displayed) Labs Reviewed  BASIC METABOLIC PANEL WITH GFR - Abnormal; Notable for the following components:      Result Value   Glucose, Bld 165 (*)    Creatinine, Ser 1.14 (*)    Calcium  8.7 (*)    GFR, Estimated 55 (*)    All other components within normal limits  CBC WITH DIFFERENTIAL/PLATELET - Abnormal; Notable for the following components:   WBC 13.2 (*)    Neutro Abs 9.6 (*)    Monocytes Absolute  1.2 (*)    All other components within normal limits  RESP PANEL BY RT-PCR (RSV, FLU A&B, COVID)  RVPGX2  LACTIC ACID, PLASMA  URINALYSIS, ROUTINE W REFLEX MICROSCOPIC     EKG    RADIOLOGY  Chest x-ray: I independently viewed and interpreted the images; there is no focal consolidation or edema  PROCEDURES:  Critical Care performed: No  .Incision and Drainage  Date/Time: 04/21/2024 3:32 PM  Performed by: Jacolyn Pae, MD Authorized by: Jacolyn Pae, MD   Consent:    Consent obtained:  Verbal   Consent given by:  Patient   Risks discussed:  Bleeding, infection, incomplete drainage and pain   Alternatives discussed:  Alternative treatment, delayed treatment and observation Universal protocol:    Patient identity confirmed:  Verbally with patient Location:    Type:  Abscess   Size:  10cm   Location:  Lower extremity   Lower extremity location:  Leg   Leg location:  L upper leg Pre-procedure details:    Skin preparation:  Povidone-iodine  Anesthesia:    Anesthesia method:  Local infiltration   Local anesthetic:   Lidocaine  1% w/o epi Procedure type:    Complexity:  Complex Procedure details:    Incision types:  Single straight   Wound management:  Probed and deloculated   Drainage:  Purulent   Drainage amount:  Copious   Wound treatment:  Wound left open   Packing materials:  None Post-procedure details:    Procedure completion:  Tolerated well, no immediate complications    MEDICATIONS ORDERED IN ED: Medications  HYDROmorphone  (DILAUDID ) injection 0.5 mg (0.5 mg Intravenous Given 04/21/24 1438)  lidocaine  (PF) (XYLOCAINE ) 1 % injection 10 mL (10 mLs Other Given by Other 04/21/24 1441)     IMPRESSION / MDM / ASSESSMENT AND PLAN / ED COURSE  I reviewed the triage vital signs and the nursing notes.  63 year old female with PMH as noted above presents with an area of swelling and redness to her left inner thigh over the last week, diagnosed with an abscess last week and started on doxycycline , along with fever, nausea, and cough since yesterday.  Differential diagnosis includes, but is not limited to, abscess, cellulitis, acute bronchitis, pneumonia, COVID or other viral syndrome.  We will perform an I&D of the left inner thigh abscess, obtain chest x-ray, respiratory panel, lab workup, and reassess.  Patient's presentation is most consistent with acute presentation with potential threat to life or bodily function.  The patient is on the cardiac monitor to evaluate for evidence of arrhythmia and/or significant heart rate changes.  ----------------------------------------- 3:31 PM on 04/21/2024 -----------------------------------------  Abscess was I&D'ed successfully with return of a large amount of pus.  CBC shows mild leukocytosis but the lactate is normal.  BMP is unremarkable.  Chest x-ray is clear.  The patient is not having any acute urinary symptoms.  We send off a respiratory panel, however this will not change the management.  The patient is stable for discharge.  She feels  comfortable going home.  I gave strict return precautions, and the patient expressed understanding.   FINAL CLINICAL IMPRESSION(S) / ED DIAGNOSES   Final diagnoses:  Abscess     Rx / DC Orders   ED Discharge Orders     None        Note:  This document was prepared using Dragon voice recognition software and may include unintentional dictation errors.    Jacolyn Pae, MD 04/21/24 [phone]

## 2024-04-21 NOTE — Telephone Encounter (Signed)
 Pt notified as instructed and pt voiced understanding; pt said if Dr KANDICE said she needed to be seen today pt would take a shower and then go to [facility] ED.pt has someone who can drive her to ED now. Pt said to cancel appt with Dr Randeen for 04/22/24. Sending note as FYI to Dr KANDICE.

## 2024-04-21 NOTE — Telephone Encounter (Signed)
 Office visit is not until tomorrow with Dr Randeen. I do recommend evaluation today for possible worsening abscess with systemic symptoms - as if that's the case, treatment is I&D as soon as possible. Don't recommend waiting until tomorrow.

## 2024-04-21 NOTE — Discharge Instructions (Signed)
 Finish the antibiotic course that you are already on.  Take ibuprofen  or Tylenol  as needed for pain.  Follow-up with your regular doctor.  Return to the ER for new, worsening, or persistent severe pus drainage, bleeding, swelling, pain, fever or chills, vomiting, or any other new or worsening symptoms that concern you.

## 2024-04-21 NOTE — ED Triage Notes (Signed)
 Pt to ER with c/o large abscess to inside of left thigh.  States has been in Doxycycline  since Thursday and it is not getting any better.  States she has developed a cough and a fever.

## 2024-04-22 ENCOUNTER — Ambulatory Visit: Admitting: Family Medicine

## 2024-04-28 ENCOUNTER — Inpatient Hospital Stay: Attending: Oncology

## 2024-04-28 ENCOUNTER — Encounter: Payer: Self-pay | Admitting: Oncology

## 2024-04-28 ENCOUNTER — Inpatient Hospital Stay: Admitting: Oncology

## 2024-04-28 VITALS — HR 79 | Temp 97.3°F | Resp 18 | Wt 279.9 lb

## 2024-04-28 DIAGNOSIS — Z86711 Personal history of pulmonary embolism: Secondary | ICD-10-CM

## 2024-04-28 DIAGNOSIS — D72829 Elevated white blood cell count, unspecified: Secondary | ICD-10-CM | POA: Diagnosis not present

## 2024-04-28 DIAGNOSIS — Z7901 Long term (current) use of anticoagulants: Secondary | ICD-10-CM | POA: Diagnosis not present

## 2024-04-28 DIAGNOSIS — Z87891 Personal history of nicotine dependence: Secondary | ICD-10-CM | POA: Diagnosis not present

## 2024-04-28 DIAGNOSIS — Z800 Family history of malignant neoplasm of digestive organs: Secondary | ICD-10-CM | POA: Insufficient documentation

## 2024-04-28 DIAGNOSIS — Z803 Family history of malignant neoplasm of breast: Secondary | ICD-10-CM | POA: Diagnosis not present

## 2024-04-28 DIAGNOSIS — Z8051 Family history of malignant neoplasm of kidney: Secondary | ICD-10-CM | POA: Diagnosis not present

## 2024-04-28 DIAGNOSIS — I2692 Saddle embolus of pulmonary artery without acute cor pulmonale: Secondary | ICD-10-CM

## 2024-04-28 LAB — CBC WITH DIFFERENTIAL (CANCER CENTER ONLY)
Abs Immature Granulocytes: 0.05 K/uL (ref 0.00–0.07)
Basophils Absolute: 0.1 K/uL (ref 0.0–0.1)
Basophils Relative: 1 %
Eosinophils Absolute: 0.2 K/uL (ref 0.0–0.5)
Eosinophils Relative: 1 %
HCT: 38.3 % (ref 36.0–46.0)
Hemoglobin: 12.3 g/dL (ref 12.0–15.0)
Immature Granulocytes: 1 %
Lymphocytes Relative: 25 %
Lymphs Abs: 2.8 K/uL (ref 0.7–4.0)
MCH: 28.1 pg (ref 26.0–34.0)
MCHC: 32.1 g/dL (ref 30.0–36.0)
MCV: 87.4 fL (ref 80.0–100.0)
Monocytes Absolute: 1 K/uL (ref 0.1–1.0)
Monocytes Relative: 9 %
Neutro Abs: 7 K/uL (ref 1.7–7.7)
Neutrophils Relative %: 63 %
Platelet Count: 335 K/uL (ref 150–400)
RBC: 4.38 MIL/uL (ref 3.87–5.11)
RDW: 14.8 % (ref 11.5–15.5)
WBC Count: 11.1 K/uL — ABNORMAL HIGH (ref 4.0–10.5)
nRBC: 0 % (ref 0.0–0.2)

## 2024-04-28 LAB — CMP (CANCER CENTER ONLY)
ALT: 13 U/L (ref 0–44)
AST: 20 U/L (ref 15–41)
Albumin: 3.5 g/dL (ref 3.5–5.0)
Alkaline Phosphatase: 69 U/L (ref 38–126)
Anion gap: 12 (ref 5–15)
BUN: 9 mg/dL (ref 8–23)
CO2: 24 mmol/L (ref 22–32)
Calcium: 8.5 mg/dL — ABNORMAL LOW (ref 8.9–10.3)
Chloride: 100 mmol/L (ref 98–111)
Creatinine: 0.92 mg/dL (ref 0.44–1.00)
GFR, Estimated: >60 mL/min (ref >60)
Glucose, Bld: 138 mg/dL — ABNORMAL HIGH (ref 70–99)
Potassium: 3.5 mmol/L (ref 3.5–5.1)
Sodium: 136 mmol/L (ref 135–145)
Total Bilirubin: 0.6 mg/dL (ref 0.0–1.2)
Total Protein: 7.5 g/dL (ref 6.5–8.1)
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
## 2024-04-28 NOTE — Assessment & Plan Note (Signed)
 Leukocytosis is likely reactive due to recent skin abscess. Trending down.

## 2024-04-28 NOTE — Assessment & Plan Note (Signed)
 unprovoked bilateral pulmonary embolism status post thrombectomy hypercoagulable workup showed negative cardiolipin antibody, negative beta glycoprotein antibodies, negative prothrombin gene mutation, negative factor V Leiden mutation 12/28/22 decreased Antithrombin III. Repeat 04/01/23 normal antithrombin level.  Labs are reviewed and discussed with patient. D-dimer wnl Recommend patient to decrease to 2.5mg  BID Recommend age-appropriate cancer screening.  She is overdue for colonoscopy.recommend patient to re-establish care with GI

## 2024-04-28 NOTE — Assessment & Plan Note (Signed)
 Recommend calcium and vitamin D supplementation.

## 2024-04-29 ENCOUNTER — Encounter: Payer: Self-pay | Admitting: Family Medicine

## 2024-04-30 ENCOUNTER — Other Ambulatory Visit: Payer: Self-pay

## 2024-04-30 ENCOUNTER — Emergency Department
Admission: EM | Admit: 2024-04-30 | Discharge: 2024-04-30 | Disposition: A | Discharge status: Home / Self Care | Attending: Emergency Medicine | Admitting: Emergency Medicine

## 2024-04-30 DIAGNOSIS — R944 Abnormal results of kidney function studies: Secondary | ICD-10-CM | POA: Diagnosis not present

## 2024-04-30 DIAGNOSIS — D72829 Elevated white blood cell count, unspecified: Secondary | ICD-10-CM | POA: Diagnosis not present

## 2024-04-30 DIAGNOSIS — L02416 Cutaneous abscess of left lower limb: Secondary | ICD-10-CM | POA: Diagnosis present

## 2024-04-30 DIAGNOSIS — L03116 Cellulitis of left lower limb: Secondary | ICD-10-CM | POA: Insufficient documentation

## 2024-04-30 DIAGNOSIS — E119 Type 2 diabetes mellitus without complications: Secondary | ICD-10-CM | POA: Diagnosis not present

## 2024-04-30 DIAGNOSIS — I2699 Other pulmonary embolism without acute cor pulmonale: Secondary | ICD-10-CM | POA: Diagnosis not present

## 2024-04-30 DIAGNOSIS — Z7901 Long term (current) use of anticoagulants: Secondary | ICD-10-CM | POA: Insufficient documentation

## 2024-04-30 DIAGNOSIS — L0291 Cutaneous abscess, unspecified: Secondary | ICD-10-CM

## 2024-04-30 LAB — BASIC METABOLIC PANEL WITH GFR
Anion gap: 13 (ref 5–15)
BUN: 8 mg/dL (ref 8–23)
CO2: 25 mmol/L (ref 22–32)
Calcium: 8.6 mg/dL — ABNORMAL LOW (ref 8.9–10.3)
Chloride: 101 mmol/L (ref 98–111)
Creatinine, Ser: 1.1 mg/dL — ABNORMAL HIGH (ref 0.44–1.00)
GFR, Estimated: 57 mL/min — ABNORMAL LOW (ref >60)
Glucose, Bld: 159 mg/dL — ABNORMAL HIGH (ref 70–99)
Potassium: 3.6 mmol/L (ref 3.5–5.1)
Sodium: 139 mmol/L (ref 135–145)

## 2024-04-30 LAB — CBC WITH DIFFERENTIAL/PLATELET
Abs Immature Granulocytes: 0.07 K/uL (ref 0.00–0.07)
Basophils Absolute: 0.1 K/uL (ref 0.0–0.1)
Basophils Relative: 1 %
Eosinophils Absolute: 0.1 K/uL (ref 0.0–0.5)
Eosinophils Relative: 1 %
HCT: 41 % (ref 36.0–46.0)
Hemoglobin: 12.9 g/dL (ref 12.0–15.0)
Immature Granulocytes: 1 %
Lymphocytes Relative: 22 %
Lymphs Abs: 2.5 K/uL (ref 0.7–4.0)
MCH: 27.7 pg (ref 26.0–34.0)
MCHC: 31.5 g/dL (ref 30.0–36.0)
MCV: 88 fL (ref 80.0–100.0)
Monocytes Absolute: 1 K/uL (ref 0.1–1.0)
Monocytes Relative: 9 %
Neutro Abs: 7.4 K/uL (ref 1.7–7.7)
Neutrophils Relative %: 66 %
Platelets: 348 K/uL (ref 150–400)
RBC: 4.66 MIL/uL (ref 3.87–5.11)
RDW: 14.9 % (ref 11.5–15.5)
WBC: 11.2 K/uL — ABNORMAL HIGH (ref 4.0–10.5)
nRBC: 0 % (ref 0.0–0.2)

## 2024-04-30 MED ORDER — SULFAMETHOXAZOLE-TRIMETHOPRIM 800-160 MG PO TABS
1.0000 | ORAL_TABLET | Freq: Two times a day (BID) | ORAL | 0 refills | Status: AC
Start: 2024-04-30 — End: 2024-05-07

## 2024-04-30 MED ORDER — HYDROMORPHONE HCL 1 MG/ML IJ SOLN
0.5000 mg | Freq: Once | INTRAMUSCULAR | Status: AC
  Administered 2024-04-30: 0.5 mg via INTRAMUSCULAR

## 2024-04-30 MED ORDER — HYDROMORPHONE HCL 1 MG/ML IJ SOLN
0.5000 mg | Freq: Once | INTRAMUSCULAR | Status: DC
  Filled 2024-04-30: qty 0.5

## 2024-04-30 MED ORDER — LIDOCAINE-EPINEPHRINE 2 %-1:100000 IJ SOLN
20.0000 mL | Freq: Once | INTRAMUSCULAR | Status: AC
  Administered 2024-04-30: 20 mL
  Filled 2024-04-30: qty 1

## 2024-04-30 MED ORDER — CEPHALEXIN 500 MG PO CAPS
500.0000 mg | ORAL_CAPSULE | Freq: Three times a day (TID) | ORAL | 0 refills | Status: AC
Start: 2024-04-30 — End: 2024-05-07

## 2024-04-30 NOTE — ED Notes (Signed)
 Three unsuccessful IV attempts by two staff members. Dr. Ernest aware.

## 2024-04-30 NOTE — ED Provider Notes (Signed)
 [facility] Provider Note    Event Date/Time   First MD Initiated Contact with Patient 04/30/24 1126     (approximate)   History   Abscess   HPI  Kathleen Good is a 63 y.o. female with diabetes who comes in with left thigh abscess.  Patient was seen on 10/23 and started on doxycycline  for 10 days.  She reports completing it 5 or 6 days ago.  She had an I&D done on her left leg on 10/28 with a large amount of pus removed.  She reports that this area has seems to improve significantly but then yesterday she noted above the area she had another area of redness and warmth and swelling noted.  She denies any fevers and reports that she does not feel as bad as that she felt on 04/21/2024.  Patient does report a history of being on Eliquis  for prior PE.   Physical Exam   Triage Vital Signs: ED Triage Vitals  Encounter Vitals Group     BP 04/30/24 1059 137/84     Girls Systolic BP Percentile --      Girls Diastolic BP Percentile --      Boys Systolic BP Percentile --      Boys Diastolic BP Percentile --      Pulse Rate 04/30/24 1059 79     Resp 04/30/24 1059 16     Temp 04/30/24 1059 98.4 F (36.9 C)     Temp Source 04/30/24 1059 Oral     SpO2 04/30/24 1059 98 %     Weight 04/30/24 1100 279 lb 12.2 oz (126.9 kg)     Height --      Head Circumference --      Peak Flow --      Pain Score 04/30/24 1059 4     Pain Loc --      Pain Education --      Exclude from Growth Chart --     Most recent vital signs: Vitals:   04/30/24 1059  BP: 137/84  Pulse: 79  Resp: 16  Temp: 98.4 F (36.9 C)  SpO2: 98%     General: Awake, no distress.  CV:  Good peripheral perfusion.  Resp:  Normal effort.  Abd:  No distention.  Other:  Softball sized area of redness in the left upper thigh with induration.  Does not cross into the labial area and stops before the inguinal crease.    ED Results / Procedures / Treatments   Labs (all labs ordered are listed, but  only abnormal results are displayed) Labs Reviewed  CBC WITH DIFFERENTIAL/PLATELET - Abnormal; Notable for the following components:      Result Value   WBC 11.2 (*)    All other components within normal limits  BASIC METABOLIC PANEL WITH GFR - Abnormal; Notable for the following components:   Glucose, Bld 159 (*)    Creatinine, Ser 1.10 (*)    Calcium  8.6 (*)    GFR, Estimated 57 (*)    All other components within normal limits  AEROBIC/ANAEROBIC CULTURE W GRAM STAIN (SURGICAL/DEEP WOUND)    PROCEDURES:  Critical Care performed: No  .Incision and Drainage  Date/Time: 04/30/2024 12:56 PM  Performed by: Ernest Ronal BRAVO, MD Authorized by: Ernest Ronal BRAVO, MD   Consent:    Consent obtained:  Verbal   Consent given by:  Patient   Risks discussed:  Bleeding, incomplete drainage, pain, infection and damage to other organs  Alternatives discussed:  No treatment Universal protocol:    Patient identity confirmed:  Verbally with patient Location:    Type:  Abscess   Size:  5x5   Location:  Lower extremity   Lower extremity location:  Leg   Leg location:  L upper leg Pre-procedure details:    Skin preparation:  Povidone-iodine  Sedation:    Sedation type:  None Anesthesia:    Anesthesia method:  Local infiltration   Local anesthetic:  Lidocaine  1% WITH epi Procedure type:    Complexity:  Simple Procedure details:    Ultrasound guidance: yes     Incision types:  Single straight   Wound management:  Probed and deloculated and irrigated with saline   Drainage:  Purulent and bloody   Drainage amount:  Copious   Packing materials:  1/4 in iodoform gauze Post-procedure details:    Procedure completion:  Tolerated well, no immediate complications    MEDICATIONS ORDERED IN ED: Medications  lidocaine -EPINEPHrine  (XYLOCAINE  W/EPI) 2 %-1:100000 (with pres) injection 20 mL (has no administration in time range)  HYDROmorphone  (DILAUDID ) injection 0.5 mg (has no administration in time  range)     IMPRESSION / MDM / ASSESSMENT AND PLAN / ED COURSE  I reviewed the triage vital signs and the nursing notes.   Patient's presentation is most consistent with acute presentation with potential threat to life or bodily function.   I reviewed the note from 04/16/2024 where patient was treated with doxycycline  for abscess.  Differential includes abscess, cellulitis.  Blood work from triage was ordered and CBC shows slightly elevated white count but does not meet any sepsis criteria.  Her BMP shows slightly elevated glucose but no signs of DKA.  Given she had near resolution of symptoms within it starting again I suspect that this is a separate area.  I did use a bedside ultrasound that the area looks very indurated with cobblestoning and with fluid streaking throughout but there was 1 smaller area that did look like it could be amenable to I&D.  We discussed attempt at another I&D and this time following up with surgical team outpatient   Initial I&D without significant of drainage therefore I did do another area under ultrasound guidance with large amount of pus.  Packing was placed into it to try to help with healing this time.  Did instruct her to take the packing out in 24 to 48 hours.  Did recommend she follow-up with surgery this time for reevaluation given the recurrent abscess formation.  Wound culture was sent off for testing and patient was started on Doxy given she did have improvement of symptoms on this as well as adding Keflex given the degree of cellulitis associated with it.  Patient was considered for admission but given reassuring vital signs and reassuring blood work and successful I&D patient can be trialed outpatient again with close surgical follow-up.   Discussed with patient and given she reports that it never really resolved with doxycycline  we will trial patient on his Bactrim  as patient reports taking Bactrim  previously and tolerated it well.  Her creatinine  clearance is above 30 so no need for dosage adjustment.  Will also add on Keflex in order to help with more of a strep given the cellulitis associated with it.  FINAL CLINICAL IMPRESSION(S) / ED DIAGNOSES   Final diagnoses:  Abscess  Cellulitis of left lower extremity     Rx / DC Orders   ED Discharge Orders  Ordered    cephALEXin (KEFLEX) 500 MG capsule  3 times daily        04/30/24 1304    sulfamethoxazole -trimethoprim  (BACTRIM  DS) 800-160 MG tablet  2 times daily        04/30/24 1304             Note:  This document was prepared using Dragon voice recognition software and may include unintentional dictation errors.   Ernest Ronal BRAVO, MD 04/30/24 [phone]

## 2024-04-30 NOTE — Discharge Instructions (Signed)
 You should remove the packing in 24 to 48 hours and continue taking the antibiotics to help with the infection.  Please call the surgical office to make a follow-up appointment for Friday if possible or Monday for a wound recheck.  Return to the ER for fevers, worsening redness or any other concern

## 2024-04-30 NOTE — ED Triage Notes (Signed)
 Abscess drained last week from left inner thigh. States area above I+D site started to turn red and painful.  States finished Doxycycline  RX.  AAOx3. Skin warm and dry. NAD

## 2024-04-30 NOTE — ED Notes (Signed)
 Area cleaned, sterile gauze and Tegaderm applied. Iodoform is in place.

## 2024-04-30 NOTE — Telephone Encounter (Signed)
 Called patient states that fever got up to 100.2 last night. She is still having chills, no nausea or vomiting. She does feel like the area is getting filled back up and is warm to the touch. I have reviewed all red words she denies any and if any start she will call 911 as she is at home alone. Her family member is going to come get her in about 2 hrs and take her back to ED for evaluation. She did not have any further questions. I have provided her via my chart the address to [facility] as option as well.

## 2024-04-30 NOTE — Telephone Encounter (Signed)
 Noted

## 2024-05-01 ENCOUNTER — Encounter: Payer: Self-pay | Admitting: *Deleted

## 2024-05-05 ENCOUNTER — Inpatient Hospital Stay: Admitting: Family Medicine

## 2024-05-06 LAB — AEROBIC/ANAEROBIC CULTURE W GRAM STAIN (SURGICAL/DEEP WOUND)

## 2024-05-19 ENCOUNTER — Ambulatory Visit: Admitting: General Surgery

## 2024-06-01 ENCOUNTER — Other Ambulatory Visit: Payer: Self-pay | Admitting: Family Medicine

## 2024-06-01 DIAGNOSIS — F419 Anxiety disorder, unspecified: Secondary | ICD-10-CM

## 2024-06-02 NOTE — Telephone Encounter (Signed)
 ERx

## 2024-06-11 ENCOUNTER — Encounter: Payer: Self-pay | Admitting: Family Medicine

## 2024-06-24 ENCOUNTER — Other Ambulatory Visit: Payer: Self-pay | Admitting: Family Medicine

## 2024-06-24 DIAGNOSIS — F331 Major depressive disorder, recurrent, moderate: Secondary | ICD-10-CM

## 2024-06-24 DIAGNOSIS — F419 Anxiety disorder, unspecified: Secondary | ICD-10-CM

## 2024-06-26 ENCOUNTER — Telehealth: Payer: Self-pay

## 2024-06-26 ENCOUNTER — Other Ambulatory Visit (HOSPITAL_COMMUNITY): Payer: Self-pay

## 2024-06-26 ENCOUNTER — Other Ambulatory Visit: Payer: Self-pay | Admitting: Family Medicine

## 2024-06-26 DIAGNOSIS — E538 Deficiency of other specified B group vitamins: Secondary | ICD-10-CM

## 2024-06-26 NOTE — Telephone Encounter (Signed)
 ERx

## 2024-06-26 NOTE — Telephone Encounter (Signed)
 Pharmacy Patient Advocate Encounter   Received notification from Onbase that prior authorization for Ozempic  (0.25 or 0.5 MG/DOSE) 2MG /3ML pen-injectors is required/requested.   Insurance verification completed.   The patient is insured through [facility] [employer] .   Per test claim: PA required; PA submitted to above mentioned insurance via Latent Key/confirmation #/EOC Ozempic  (0.25 or 0.5 MG/DOSE) 2MG /3ML pen-injectors Status is pending

## 2024-06-29 ENCOUNTER — Other Ambulatory Visit (HOSPITAL_COMMUNITY): Payer: Self-pay

## 2024-06-29 NOTE — Telephone Encounter (Signed)
 Pharmacy Patient Advocate Encounter  Received notification from [facility] [employer] that Prior Authorization for Ozempic  (0.25 or 0.5 MG/DOSE) 2MG /3ML pen-injectors has been APPROVED from 06/26/24 to 06/26/25   PA #/Case ID/Reference #: [id number]

## 2024-07-02 NOTE — Telephone Encounter (Signed)
 My chart message sent to pt.

## 2024-07-20 ENCOUNTER — Other Ambulatory Visit (HOSPITAL_COMMUNITY): Payer: Self-pay

## 2024-07-22 ENCOUNTER — Other Ambulatory Visit: Payer: Self-pay | Admitting: Family Medicine

## 2024-07-22 DIAGNOSIS — F419 Anxiety disorder, unspecified: Secondary | ICD-10-CM

## 2024-07-22 NOTE — Telephone Encounter (Signed)
 ERx

## 2024-10-26 ENCOUNTER — Inpatient Hospital Stay

## 2024-10-26 ENCOUNTER — Inpatient Hospital Stay: Admitting: Oncology
